# Patient Record
Sex: Male | Born: 1945 | Race: White | Hispanic: No | Marital: Married | Smoking: Former smoker
Health system: Southern US, Community
[De-identification: ages and names within clinical notes are randomized; demographics above are authoritative.]

## PROBLEM LIST (undated history)

## (undated) DIAGNOSIS — C801 Malignant (primary) neoplasm, unspecified: Secondary | ICD-10-CM

## (undated) DIAGNOSIS — C679 Malignant neoplasm of bladder, unspecified: Secondary | ICD-10-CM

## (undated) DIAGNOSIS — I10 Essential (primary) hypertension: Secondary | ICD-10-CM

## (undated) DIAGNOSIS — Z936 Other artificial openings of urinary tract status: Secondary | ICD-10-CM

## (undated) DIAGNOSIS — E785 Hyperlipidemia, unspecified: Secondary | ICD-10-CM

## (undated) DIAGNOSIS — C3490 Malignant neoplasm of unspecified part of unspecified bronchus or lung: Secondary | ICD-10-CM

## (undated) DIAGNOSIS — K219 Gastro-esophageal reflux disease without esophagitis: Secondary | ICD-10-CM

## (undated) DIAGNOSIS — G629 Polyneuropathy, unspecified: Secondary | ICD-10-CM

## (undated) HISTORY — PX: HERNIA REPAIR: SHX51

## (undated) HISTORY — PX: OTHER SURGICAL HISTORY: SHX169

## (undated) HISTORY — PX: CARDIAC SURGERY: SHX584

## (undated) HISTORY — PX: PNEUMONECTOMY: SHX168

## (undated) HISTORY — PX: CHOLECYSTECTOMY: SHX55

## (undated) HISTORY — PX: NEPHRECTOMY: SHX65

---

## 2004-09-22 ENCOUNTER — Ambulatory Visit (HOSPITAL_COMMUNITY): Admission: RE | Admit: 2004-09-22 | Discharge: 2004-09-22 | Payer: Self-pay | Admitting: Thoracic Surgery

## 2004-09-24 ENCOUNTER — Ambulatory Visit (HOSPITAL_COMMUNITY): Admission: RE | Admit: 2004-09-24 | Discharge: 2004-09-24 | Payer: Self-pay | Admitting: Thoracic Surgery

## 2004-09-24 ENCOUNTER — Encounter (INDEPENDENT_AMBULATORY_CARE_PROVIDER_SITE_OTHER): Payer: Self-pay | Admitting: *Deleted

## 2004-09-30 ENCOUNTER — Ambulatory Visit: Payer: Self-pay | Admitting: Internal Medicine

## 2004-11-15 ENCOUNTER — Ambulatory Visit: Payer: Self-pay | Admitting: Internal Medicine

## 2004-12-16 ENCOUNTER — Ambulatory Visit (HOSPITAL_COMMUNITY): Admission: RE | Admit: 2004-12-16 | Discharge: 2004-12-16 | Payer: Self-pay | Admitting: Internal Medicine

## 2005-01-11 ENCOUNTER — Encounter (INDEPENDENT_AMBULATORY_CARE_PROVIDER_SITE_OTHER): Payer: Self-pay | Admitting: Specialist

## 2005-01-11 ENCOUNTER — Ambulatory Visit: Payer: Self-pay | Admitting: Internal Medicine

## 2005-01-11 ENCOUNTER — Inpatient Hospital Stay (HOSPITAL_COMMUNITY): Admission: RE | Admit: 2005-01-11 | Discharge: 2005-01-21 | Payer: Self-pay | Admitting: Thoracic Surgery

## 2005-01-13 ENCOUNTER — Encounter (INDEPENDENT_AMBULATORY_CARE_PROVIDER_SITE_OTHER): Payer: Self-pay | Admitting: *Deleted

## 2005-01-14 ENCOUNTER — Ambulatory Visit: Payer: Self-pay | Admitting: Internal Medicine

## 2005-01-24 ENCOUNTER — Ambulatory Visit: Payer: Self-pay | Admitting: Cardiology

## 2005-01-31 ENCOUNTER — Ambulatory Visit: Payer: Self-pay | Admitting: Cardiology

## 2005-02-01 ENCOUNTER — Inpatient Hospital Stay (HOSPITAL_COMMUNITY): Admission: AD | Admit: 2005-02-01 | Discharge: 2005-02-09 | Payer: Self-pay | Admitting: Thoracic Surgery

## 2005-02-01 ENCOUNTER — Encounter: Admission: RE | Admit: 2005-02-01 | Discharge: 2005-02-01 | Payer: Self-pay | Admitting: Thoracic Surgery

## 2005-02-01 ENCOUNTER — Ambulatory Visit: Payer: Self-pay | Admitting: Cardiology

## 2005-02-02 ENCOUNTER — Encounter (INDEPENDENT_AMBULATORY_CARE_PROVIDER_SITE_OTHER): Payer: Self-pay | Admitting: *Deleted

## 2005-02-02 ENCOUNTER — Encounter: Payer: Self-pay | Admitting: Cardiovascular Disease

## 2005-02-04 ENCOUNTER — Encounter: Payer: Self-pay | Admitting: Cardiology

## 2005-02-15 ENCOUNTER — Encounter: Admission: RE | Admit: 2005-02-15 | Discharge: 2005-02-15 | Payer: Self-pay | Admitting: Thoracic Surgery

## 2005-03-01 ENCOUNTER — Encounter: Admission: RE | Admit: 2005-03-01 | Discharge: 2005-03-01 | Payer: Self-pay | Admitting: Thoracic Surgery

## 2005-03-14 ENCOUNTER — Ambulatory Visit: Payer: Self-pay | Admitting: Internal Medicine

## 2005-04-05 ENCOUNTER — Ambulatory Visit (HOSPITAL_COMMUNITY): Admission: RE | Admit: 2005-04-05 | Discharge: 2005-04-05 | Payer: Self-pay | Admitting: Internal Medicine

## 2005-04-12 ENCOUNTER — Encounter: Admission: RE | Admit: 2005-04-12 | Discharge: 2005-04-12 | Payer: Self-pay | Admitting: Thoracic Surgery

## 2005-04-19 LAB — CBC WITH DIFFERENTIAL/PLATELET
BASO%: 2.1 % — ABNORMAL HIGH (ref 0.0–2.0)
EOS%: 0.3 % (ref 0.0–7.0)
MCH: 31.3 pg (ref 28.0–33.4)
MCHC: 34.5 g/dL (ref 32.0–35.9)
MCV: 90.6 fL (ref 81.6–98.0)
MONO%: 7.2 % (ref 0.0–13.0)
RBC: 3.5 10*6/uL — ABNORMAL LOW (ref 4.20–5.71)
RDW: 15.3 % — ABNORMAL HIGH (ref 11.2–14.6)
lymph#: 1.1 10*3/uL (ref 0.9–3.3)

## 2005-04-19 LAB — COMPREHENSIVE METABOLIC PANEL
ALT: 16 U/L (ref 0–40)
AST: 16 U/L (ref 0–37)
Albumin: 3.7 g/dL (ref 3.5–5.2)
Alkaline Phosphatase: 90 U/L (ref 39–117)
Calcium: 8.5 mg/dL (ref 8.4–10.5)
Chloride: 104 mEq/L (ref 96–112)
Creatinine, Ser: 1 mg/dL (ref 0.4–1.5)
Potassium: 4.5 mEq/L (ref 3.5–5.3)

## 2005-04-26 LAB — COMPREHENSIVE METABOLIC PANEL
ALT: 33 U/L (ref 0–40)
AST: 29 U/L (ref 0–37)
Alkaline Phosphatase: 109 U/L (ref 39–117)
BUN: 18 mg/dL (ref 6–23)
Calcium: 8.9 mg/dL (ref 8.4–10.5)
Chloride: 105 mEq/L (ref 96–112)
Creatinine, Ser: 1.1 mg/dL (ref 0.4–1.5)
Total Bilirubin: 0.4 mg/dL (ref 0.3–1.2)

## 2005-04-26 LAB — CBC WITH DIFFERENTIAL/PLATELET
BASO%: 1.1 % (ref 0.0–2.0)
Basophils Absolute: 0 10*3/uL (ref 0.0–0.1)
EOS%: 0.6 % (ref 0.0–7.0)
HCT: 33 % — ABNORMAL LOW (ref 38.7–49.9)
HGB: 11.2 g/dL — ABNORMAL LOW (ref 13.0–17.1)
MCH: 31.4 pg (ref 28.0–33.4)
MCHC: 34 g/dL (ref 32.0–35.9)
MCV: 92.5 fL (ref 81.6–98.0)
MONO%: 6.8 % (ref 0.0–13.0)
NEUT%: 49.8 % (ref 40.0–75.0)
lymph#: 1.4 10*3/uL (ref 0.9–3.3)

## 2005-04-29 ENCOUNTER — Ambulatory Visit: Payer: Self-pay | Admitting: Internal Medicine

## 2005-05-02 LAB — CBC WITH DIFFERENTIAL/PLATELET
BASO%: 0.5 % (ref 0.0–2.0)
Basophils Absolute: 0 10*3/uL (ref 0.0–0.1)
EOS%: 2.8 % (ref 0.0–7.0)
HCT: 33 % — ABNORMAL LOW (ref 38.7–49.9)
HGB: 11.1 g/dL — ABNORMAL LOW (ref 13.0–17.1)
LYMPH%: 19.7 % (ref 14.0–48.0)
MCH: 31.7 pg (ref 28.0–33.4)
MCHC: 33.7 g/dL (ref 32.0–35.9)
MCV: 94.1 fL (ref 81.6–98.0)
MONO%: 7.4 % (ref 0.0–13.0)
NEUT%: 69.6 % (ref 40.0–75.0)
Platelets: 355 10*3/uL (ref 145–400)
lymph#: 1.3 10*3/uL (ref 0.9–3.3)

## 2005-05-02 LAB — COMPREHENSIVE METABOLIC PANEL
ALT: 23 U/L (ref 0–40)
AST: 22 U/L (ref 0–37)
BUN: 17 mg/dL (ref 6–23)
Calcium: 9 mg/dL (ref 8.4–10.5)
Chloride: 107 mEq/L (ref 96–112)
Creatinine, Ser: 1.1 mg/dL (ref 0.4–1.5)
Total Bilirubin: 0.4 mg/dL (ref 0.3–1.2)

## 2005-05-10 LAB — COMPREHENSIVE METABOLIC PANEL
Albumin: 3.6 g/dL (ref 3.5–5.2)
Alkaline Phosphatase: 81 U/L (ref 39–117)
BUN: 20 mg/dL (ref 6–23)
Creatinine, Ser: 1.1 mg/dL (ref 0.4–1.5)
Glucose, Bld: 80 mg/dL (ref 70–99)
Potassium: 4.5 mEq/L (ref 3.5–5.3)
Total Bilirubin: 0.3 mg/dL (ref 0.3–1.2)

## 2005-05-10 LAB — CBC WITH DIFFERENTIAL/PLATELET
Basophils Absolute: 0.1 10*3/uL (ref 0.0–0.1)
Eosinophils Absolute: 0 10*3/uL (ref 0.0–0.5)
HCT: 31.8 % — ABNORMAL LOW (ref 38.7–49.9)
HGB: 11.1 g/dL — ABNORMAL LOW (ref 13.0–17.1)
MCV: 92.4 fL (ref 81.6–98.0)
MONO%: 8.2 % (ref 0.0–13.0)
NEUT#: 2.9 10*3/uL (ref 1.5–6.5)
RDW: 16.8 % — ABNORMAL HIGH (ref 11.2–14.6)

## 2005-05-25 ENCOUNTER — Ambulatory Visit (HOSPITAL_COMMUNITY): Admission: RE | Admit: 2005-05-25 | Discharge: 2005-05-25 | Payer: Self-pay | Admitting: Internal Medicine

## 2005-05-31 ENCOUNTER — Ambulatory Visit (HOSPITAL_COMMUNITY): Admission: RE | Admit: 2005-05-31 | Discharge: 2005-05-31 | Payer: Self-pay | Admitting: Internal Medicine

## 2005-06-01 LAB — COMPREHENSIVE METABOLIC PANEL
ALT: 15 U/L (ref 0–40)
AST: 19 U/L (ref 0–37)
Alkaline Phosphatase: 88 U/L (ref 39–117)
Creatinine, Ser: 1.2 mg/dL (ref 0.4–1.5)
Total Bilirubin: 0.4 mg/dL (ref 0.3–1.2)

## 2005-06-01 LAB — CBC WITH DIFFERENTIAL/PLATELET
BASO%: 0.6 % (ref 0.0–2.0)
EOS%: 2.9 % (ref 0.0–7.0)
HCT: 35.1 % — ABNORMAL LOW (ref 38.7–49.9)
LYMPH%: 24.1 % (ref 14.0–48.0)
MCH: 32.3 pg (ref 28.0–33.4)
MCHC: 33.6 g/dL (ref 32.0–35.9)
MCV: 96.2 fL (ref 81.6–98.0)
MONO%: 11.7 % (ref 0.0–13.0)
NEUT%: 60.7 % (ref 40.0–75.0)
Platelets: 324 10*3/uL (ref 145–400)
lymph#: 1.4 10*3/uL (ref 0.9–3.3)

## 2005-06-03 ENCOUNTER — Encounter (INDEPENDENT_AMBULATORY_CARE_PROVIDER_SITE_OTHER): Payer: Self-pay | Admitting: Specialist

## 2005-06-03 ENCOUNTER — Ambulatory Visit (HOSPITAL_COMMUNITY): Admission: RE | Admit: 2005-06-03 | Discharge: 2005-06-03 | Payer: Self-pay | Admitting: Internal Medicine

## 2005-06-22 ENCOUNTER — Ambulatory Visit: Payer: Self-pay | Admitting: Internal Medicine

## 2005-07-13 ENCOUNTER — Encounter: Admission: RE | Admit: 2005-07-13 | Discharge: 2005-07-13 | Payer: Self-pay | Admitting: Thoracic Surgery

## 2005-07-19 LAB — CBC WITH DIFFERENTIAL/PLATELET
BASO%: 0.4 % (ref 0.0–2.0)
Basophils Absolute: 0 10*3/uL (ref 0.0–0.1)
Eosinophils Absolute: 0.1 10*3/uL (ref 0.0–0.5)
HCT: 39.3 % (ref 38.7–49.9)
HGB: 13.5 g/dL (ref 13.0–17.1)
LYMPH%: 20.2 % (ref 14.0–48.0)
MONO#: 0.4 10*3/uL (ref 0.1–0.9)
NEUT#: 4.3 10*3/uL (ref 1.5–6.5)
NEUT%: 71.4 % (ref 40.0–75.0)
Platelets: 233 10*3/uL (ref 145–400)
WBC: 6 10*3/uL (ref 4.0–10.0)
lymph#: 1.2 10*3/uL (ref 0.9–3.3)

## 2005-07-19 LAB — COMPREHENSIVE METABOLIC PANEL
ALT: 11 U/L (ref 0–40)
BUN: 23 mg/dL (ref 6–23)
CO2: 25 mEq/L (ref 19–32)
Calcium: 8.9 mg/dL (ref 8.4–10.5)
Chloride: 105 mEq/L (ref 96–112)
Creatinine, Ser: 1.4 mg/dL (ref 0.40–1.50)
Glucose, Bld: 93 mg/dL (ref 70–99)

## 2005-08-11 ENCOUNTER — Ambulatory Visit (HOSPITAL_COMMUNITY): Admission: RE | Admit: 2005-08-11 | Discharge: 2005-08-11 | Payer: Self-pay | Admitting: Internal Medicine

## 2005-08-15 ENCOUNTER — Ambulatory Visit: Payer: Self-pay | Admitting: Internal Medicine

## 2005-08-16 LAB — CBC WITH DIFFERENTIAL/PLATELET
Basophils Absolute: 0.1 10*3/uL (ref 0.0–0.1)
Eosinophils Absolute: 0.1 10*3/uL (ref 0.0–0.5)
HGB: 14.9 g/dL (ref 13.0–17.1)
LYMPH%: 19.8 % (ref 14.0–48.0)
MCV: 96.3 fL (ref 81.6–98.0)
MONO#: 0.3 10*3/uL (ref 0.1–0.9)
MONO%: 5.3 % (ref 0.0–13.0)
NEUT#: 4.1 10*3/uL (ref 1.5–6.5)
Platelets: 209 10*3/uL (ref 145–400)
RBC: 4.6 10*6/uL (ref 4.20–5.71)
WBC: 5.6 10*3/uL (ref 4.0–10.0)

## 2005-08-16 LAB — COMPREHENSIVE METABOLIC PANEL
ALT: 11 U/L (ref 0–40)
AST: 13 U/L (ref 0–37)
BUN: 17 mg/dL (ref 6–23)
CO2: 28 mEq/L (ref 19–32)
Calcium: 9.6 mg/dL (ref 8.4–10.5)
Glucose, Bld: 102 mg/dL — ABNORMAL HIGH (ref 70–99)
Potassium: 4.1 mEq/L (ref 3.5–5.3)
Sodium: 140 mEq/L (ref 135–145)

## 2005-08-29 LAB — CBC WITH DIFFERENTIAL/PLATELET
BASO%: 1 % (ref 0.0–2.0)
LYMPH%: 24.4 % (ref 14.0–48.0)
MCH: 32.3 pg (ref 28.0–33.4)
MCHC: 34.3 g/dL (ref 32.0–35.9)
MCV: 94.2 fL (ref 81.6–98.0)
MONO%: 8.3 % (ref 0.0–13.0)
Platelets: 199 10*3/uL (ref 145–400)
RBC: 4.22 10*6/uL (ref 4.20–5.71)

## 2005-08-29 LAB — COMPREHENSIVE METABOLIC PANEL
ALT: 13 U/L (ref 0–40)
AST: 12 U/L (ref 0–37)
BUN: 18 mg/dL (ref 6–23)
CO2: 26 mEq/L (ref 19–32)
Creatinine, Ser: 1.04 mg/dL (ref 0.40–1.50)
Total Bilirubin: 0.4 mg/dL (ref 0.3–1.2)

## 2005-08-29 LAB — UA PROTEIN, DIPSTICK - CHCC: Protein, Urine: NEGATIVE mg/dL

## 2005-09-07 LAB — CBC WITH DIFFERENTIAL/PLATELET
BASO%: 0.5 % (ref 0.0–2.0)
Basophils Absolute: 0.1 10*3/uL (ref 0.0–0.1)
HCT: 37 % — ABNORMAL LOW (ref 38.7–49.9)
HGB: 12.7 g/dL — ABNORMAL LOW (ref 13.0–17.1)
LYMPH%: 16.5 % (ref 14.0–48.0)
MCHC: 34.3 g/dL (ref 32.0–35.9)
MONO#: 0.6 10*3/uL (ref 0.1–0.9)
NEUT%: 78.3 % — ABNORMAL HIGH (ref 40.0–75.0)
Platelets: 147 10*3/uL (ref 145–400)
WBC: 14 10*3/uL — ABNORMAL HIGH (ref 4.0–10.0)

## 2005-09-07 LAB — COMPREHENSIVE METABOLIC PANEL
ALT: 22 U/L (ref 0–40)
AST: 17 U/L (ref 0–37)
Albumin: 3.9 g/dL (ref 3.5–5.2)
Alkaline Phosphatase: 115 U/L (ref 39–117)
Calcium: 8.9 mg/dL (ref 8.4–10.5)
Chloride: 107 mEq/L (ref 96–112)
Potassium: 4.5 mEq/L (ref 3.5–5.3)

## 2005-09-14 LAB — CBC WITH DIFFERENTIAL/PLATELET
BASO%: 1.1 % (ref 0.0–2.0)
EOS%: 0.7 % (ref 0.0–7.0)
HCT: 40.3 % (ref 38.7–49.9)
MCH: 32.4 pg (ref 28.0–33.4)
MCHC: 34.4 g/dL (ref 32.0–35.9)
MCV: 94.2 fL (ref 81.6–98.0)
MONO%: 7.1 % (ref 0.0–13.0)
NEUT%: 76 % — ABNORMAL HIGH (ref 40.0–75.0)
RDW: 13.4 % (ref 11.2–14.6)
lymph#: 1.4 10*3/uL (ref 0.9–3.3)

## 2005-09-14 LAB — URINALYSIS, MICROSCOPIC - CHCC
Bilirubin (Urine): NEGATIVE
Blood: NEGATIVE
Leukocyte Esterase: NEGATIVE
Nitrite: NEGATIVE
RBC count: NEGATIVE (ref 0–2)
pH: 5 (ref 4.6–8.0)

## 2005-09-20 LAB — COMPREHENSIVE METABOLIC PANEL
BUN: 17 mg/dL (ref 6–23)
CO2: 26 mEq/L (ref 19–32)
Calcium: 8.6 mg/dL (ref 8.4–10.5)
Chloride: 109 mEq/L (ref 96–112)
Creatinine, Ser: 0.94 mg/dL (ref 0.40–1.50)

## 2005-09-20 LAB — CBC WITH DIFFERENTIAL/PLATELET
BASO%: 1.1 % (ref 0.0–2.0)
Basophils Absolute: 0.1 10*3/uL (ref 0.0–0.1)
EOS%: 0.7 % (ref 0.0–7.0)
HCT: 38.2 % — ABNORMAL LOW (ref 38.7–49.9)
HGB: 13.2 g/dL (ref 13.0–17.1)
LYMPH%: 19.4 % (ref 14.0–48.0)
MCH: 32.2 pg (ref 28.0–33.4)
MCHC: 34.6 g/dL (ref 32.0–35.9)
MCV: 93 fL (ref 81.6–98.0)
MONO%: 9 % (ref 0.0–13.0)
NEUT%: 69.7 % (ref 40.0–75.0)
Platelets: 239 10*3/uL (ref 145–400)

## 2005-09-29 LAB — CBC WITH DIFFERENTIAL/PLATELET
BASO%: 1.7 % (ref 0.0–2.0)
Basophils Absolute: 0.2 10*3/uL — ABNORMAL HIGH (ref 0.0–0.1)
Eosinophils Absolute: 0.1 10*3/uL (ref 0.0–0.5)
HCT: 36.2 % — ABNORMAL LOW (ref 38.7–49.9)
HGB: 12.1 g/dL — ABNORMAL LOW (ref 13.0–17.1)
MONO#: 1.2 10*3/uL — ABNORMAL HIGH (ref 0.1–0.9)
NEUT#: 10.1 10*3/uL — ABNORMAL HIGH (ref 1.5–6.5)
NEUT%: 73.9 % (ref 40.0–75.0)
WBC: 13.6 10*3/uL — ABNORMAL HIGH (ref 4.0–10.0)
lymph#: 2.1 10*3/uL (ref 0.9–3.3)

## 2005-09-29 LAB — COMPREHENSIVE METABOLIC PANEL
AST: 17 U/L (ref 0–37)
Albumin: 3.8 g/dL (ref 3.5–5.2)
BUN: 15 mg/dL (ref 6–23)
Calcium: 9 mg/dL (ref 8.4–10.5)
Chloride: 107 mEq/L (ref 96–112)
Glucose, Bld: 97 mg/dL (ref 70–99)
Potassium: 4.5 mEq/L (ref 3.5–5.3)

## 2005-10-03 ENCOUNTER — Ambulatory Visit: Payer: Self-pay | Admitting: Internal Medicine

## 2005-10-05 LAB — COMPREHENSIVE METABOLIC PANEL
ALT: 17 U/L (ref 0–40)
AST: 14 U/L (ref 0–37)
BUN: 15 mg/dL (ref 6–23)
Calcium: 8.9 mg/dL (ref 8.4–10.5)
Creatinine, Ser: 1 mg/dL (ref 0.40–1.50)
Total Bilirubin: 0.4 mg/dL (ref 0.3–1.2)

## 2005-10-05 LAB — CBC WITH DIFFERENTIAL/PLATELET
BASO%: 0.5 % (ref 0.0–2.0)
Basophils Absolute: 0.1 10*3/uL (ref 0.0–0.1)
EOS%: 0.3 % (ref 0.0–7.0)
HGB: 13.9 g/dL (ref 13.0–17.1)
MCH: 33 pg (ref 28.0–33.4)
RDW: 14.9 % — ABNORMAL HIGH (ref 11.2–14.6)
lymph#: 1.6 10*3/uL (ref 0.9–3.3)

## 2005-10-05 LAB — UA PROTEIN, DIPSTICK - CHCC: Protein, Urine: NEGATIVE mg/dL

## 2005-10-10 LAB — COMPREHENSIVE METABOLIC PANEL
ALT: 10 U/L (ref 0–40)
CO2: 24 mEq/L (ref 19–32)
Calcium: 8.5 mg/dL (ref 8.4–10.5)
Chloride: 107 mEq/L (ref 96–112)
Creatinine, Ser: 0.94 mg/dL (ref 0.40–1.50)
Glucose, Bld: 109 mg/dL — ABNORMAL HIGH (ref 70–99)
Total Bilirubin: 0.4 mg/dL (ref 0.3–1.2)
Total Protein: 6.3 g/dL (ref 6.0–8.3)

## 2005-10-10 LAB — CBC WITH DIFFERENTIAL/PLATELET
BASO%: 0.8 % (ref 0.0–2.0)
Eosinophils Absolute: 0 10*3/uL (ref 0.0–0.5)
HCT: 36.1 % — ABNORMAL LOW (ref 38.7–49.9)
MCHC: 34.6 g/dL (ref 32.0–35.9)
MONO#: 0.5 10*3/uL (ref 0.1–0.9)
NEUT#: 5.2 10*3/uL (ref 1.5–6.5)
Platelets: 199 10*3/uL (ref 145–400)
RBC: 3.78 10*6/uL — ABNORMAL LOW (ref 4.20–5.71)
WBC: 6.7 10*3/uL (ref 4.0–10.0)
lymph#: 0.9 10*3/uL (ref 0.9–3.3)

## 2005-10-11 ENCOUNTER — Encounter: Admission: RE | Admit: 2005-10-11 | Discharge: 2005-10-11 | Payer: Self-pay | Admitting: Thoracic Surgery

## 2005-10-18 LAB — COMPREHENSIVE METABOLIC PANEL
Albumin: 2.9 g/dL — ABNORMAL LOW (ref 3.5–5.2)
BUN: 11 mg/dL (ref 6–23)
CO2: 23 mEq/L (ref 19–32)
Calcium: 7 mg/dL — ABNORMAL LOW (ref 8.4–10.5)
Chloride: 111 mEq/L (ref 96–112)
Creatinine, Ser: 0.74 mg/dL (ref 0.40–1.50)
Potassium: 3.1 mEq/L — ABNORMAL LOW (ref 3.5–5.3)

## 2005-10-18 LAB — CBC WITH DIFFERENTIAL/PLATELET
Basophils Absolute: 0.1 10*3/uL (ref 0.0–0.1)
Eosinophils Absolute: 0.1 10*3/uL (ref 0.0–0.5)
HCT: 34.3 % — ABNORMAL LOW (ref 38.7–49.9)
HGB: 12.3 g/dL — ABNORMAL LOW (ref 13.0–17.1)
LYMPH%: 18.8 % (ref 14.0–48.0)
MCHC: 35.8 g/dL (ref 32.0–35.9)
MONO#: 1.1 10*3/uL — ABNORMAL HIGH (ref 0.1–0.9)
NEUT#: 5.4 10*3/uL (ref 1.5–6.5)
NEUT%: 65 % (ref 40.0–75.0)
Platelets: 136 10*3/uL — ABNORMAL LOW (ref 145–400)
WBC: 8.4 10*3/uL (ref 4.0–10.0)
lymph#: 1.6 10*3/uL (ref 0.9–3.3)

## 2005-10-28 ENCOUNTER — Ambulatory Visit (HOSPITAL_COMMUNITY): Admission: RE | Admit: 2005-10-28 | Discharge: 2005-10-28 | Payer: Self-pay | Admitting: Internal Medicine

## 2005-11-01 LAB — CBC WITH DIFFERENTIAL/PLATELET
BASO%: 1.1 % (ref 0.0–2.0)
Basophils Absolute: 0.1 10*3/uL (ref 0.0–0.1)
EOS%: 0.6 % (ref 0.0–7.0)
Eosinophils Absolute: 0 10*3/uL (ref 0.0–0.5)
HCT: 38.7 % (ref 38.7–49.9)
HGB: 13.4 g/dL (ref 13.0–17.1)
LYMPH%: 16.9 % (ref 14.0–48.0)
MCH: 33.4 pg (ref 28.0–33.4)
MCHC: 34.6 g/dL (ref 32.0–35.9)
MCV: 96.3 fL (ref 81.6–98.0)
MONO#: 0.7 10*3/uL (ref 0.1–0.9)
MONO%: 8.5 % (ref 0.0–13.0)
NEUT#: 5.7 10*3/uL (ref 1.5–6.5)
NEUT%: 72.9 % (ref 40.0–75.0)
Platelets: 231 10*3/uL (ref 145–400)
RBC: 4.02 10*6/uL — ABNORMAL LOW (ref 4.20–5.71)
RDW: 16.5 % — ABNORMAL HIGH (ref 11.2–14.6)
WBC: 7.9 10*3/uL (ref 4.0–10.0)
lymph#: 1.3 10*3/uL (ref 0.9–3.3)

## 2005-11-01 LAB — COMPREHENSIVE METABOLIC PANEL
ALT: 9 U/L (ref 0–40)
Albumin: 3.9 g/dL (ref 3.5–5.2)
CO2: 24 mEq/L (ref 19–32)
Calcium: 8.7 mg/dL (ref 8.4–10.5)
Chloride: 106 mEq/L (ref 96–112)
Creatinine, Ser: 0.91 mg/dL (ref 0.40–1.50)
Potassium: 4.5 mEq/L (ref 3.5–5.3)
Sodium: 140 mEq/L (ref 135–145)
Total Protein: 7 g/dL (ref 6.0–8.3)

## 2005-11-01 LAB — UA PROTEIN, DIPSTICK - CHCC: Protein, Urine: NEGATIVE mg/dL

## 2005-11-08 LAB — COMPREHENSIVE METABOLIC PANEL
AST: 14 U/L (ref 0–37)
Albumin: 3.7 g/dL (ref 3.5–5.2)
Alkaline Phosphatase: 121 U/L — ABNORMAL HIGH (ref 39–117)
BUN: 17 mg/dL (ref 6–23)
Calcium: 8.3 mg/dL — ABNORMAL LOW (ref 8.4–10.5)
Chloride: 104 mEq/L (ref 96–112)
Potassium: 4 mEq/L (ref 3.5–5.3)
Sodium: 138 mEq/L (ref 135–145)
Total Protein: 6.1 g/dL (ref 6.0–8.3)

## 2005-11-08 LAB — CBC WITH DIFFERENTIAL/PLATELET
Basophils Absolute: 0 10*3/uL (ref 0.0–0.1)
EOS%: 1.3 % (ref 0.0–7.0)
Eosinophils Absolute: 0.1 10*3/uL (ref 0.0–0.5)
HGB: 11.2 g/dL — ABNORMAL LOW (ref 13.0–17.1)
MCH: 34 pg — ABNORMAL HIGH (ref 28.0–33.4)
NEUT#: 3 10*3/uL (ref 1.5–6.5)
RBC: 3.3 10*6/uL — ABNORMAL LOW (ref 4.20–5.71)
RDW: 18.1 % — ABNORMAL HIGH (ref 11.2–14.6)
lymph#: 1.3 10*3/uL (ref 0.9–3.3)

## 2005-11-15 LAB — COMPREHENSIVE METABOLIC PANEL
ALT: 11 U/L (ref 0–40)
AST: 15 U/L (ref 0–37)
Alkaline Phosphatase: 134 U/L — ABNORMAL HIGH (ref 39–117)
CO2: 25 mEq/L (ref 19–32)
Sodium: 143 mEq/L (ref 135–145)
Total Bilirubin: 0.4 mg/dL (ref 0.3–1.2)
Total Protein: 6.5 g/dL (ref 6.0–8.3)

## 2005-11-15 LAB — CBC WITH DIFFERENTIAL/PLATELET
BASO%: 1.2 % (ref 0.0–2.0)
Basophils Absolute: 0.1 10*3/uL (ref 0.0–0.1)
EOS%: 0.6 % (ref 0.0–7.0)
HGB: 12.5 g/dL — ABNORMAL LOW (ref 13.0–17.1)
MCH: 34.4 pg — ABNORMAL HIGH (ref 28.0–33.4)
MCHC: 34.2 g/dL (ref 32.0–35.9)
MCV: 100.6 fL — ABNORMAL HIGH (ref 81.6–98.0)
MONO%: 5.3 % (ref 0.0–13.0)
RBC: 3.63 10*6/uL — ABNORMAL LOW (ref 4.20–5.71)
RDW: 19 % — ABNORMAL HIGH (ref 11.2–14.6)
lymph#: 1.6 10*3/uL (ref 0.9–3.3)

## 2005-12-02 ENCOUNTER — Ambulatory Visit: Payer: Self-pay | Admitting: Internal Medicine

## 2005-12-06 LAB — COMPREHENSIVE METABOLIC PANEL
ALT: 8 U/L (ref 0–53)
AST: 12 U/L (ref 0–37)
Albumin: 3.8 g/dL (ref 3.5–5.2)
CO2: 26 mEq/L (ref 19–32)
Calcium: 8.7 mg/dL (ref 8.4–10.5)
Chloride: 107 mEq/L (ref 96–112)
Creatinine, Ser: 0.98 mg/dL (ref 0.40–1.50)
Potassium: 4.3 mEq/L (ref 3.5–5.3)
Sodium: 140 mEq/L (ref 135–145)
Total Protein: 6.7 g/dL (ref 6.0–8.3)

## 2005-12-06 LAB — CBC WITH DIFFERENTIAL/PLATELET
BASO%: 2.4 % — ABNORMAL HIGH (ref 0.0–2.0)
EOS%: 2.5 % (ref 0.0–7.0)
HCT: 36.7 % — ABNORMAL LOW (ref 38.7–49.9)
MCH: 34.8 pg — ABNORMAL HIGH (ref 28.0–33.4)
MCHC: 34.2 g/dL (ref 32.0–35.9)
MONO#: 0.4 10*3/uL (ref 0.1–0.9)
NEUT%: 67.3 % (ref 40.0–75.0)
RDW: 16.5 % — ABNORMAL HIGH (ref 11.2–14.6)
WBC: 6.5 10*3/uL (ref 4.0–10.0)
lymph#: 1.4 10*3/uL (ref 0.9–3.3)

## 2005-12-13 LAB — CBC WITH DIFFERENTIAL/PLATELET
BASO%: 0.8 % (ref 0.0–2.0)
Basophils Absolute: 0.1 10*3/uL (ref 0.0–0.1)
HCT: 39.7 % (ref 38.7–49.9)
LYMPH%: 20.1 % (ref 14.0–48.0)
MCH: 35.2 pg — ABNORMAL HIGH (ref 28.0–33.4)
MCHC: 35 g/dL (ref 32.0–35.9)
MONO#: 0.5 10*3/uL (ref 0.1–0.9)
NEUT%: 69.2 % (ref 40.0–75.0)
Platelets: 198 10*3/uL (ref 145–400)
WBC: 7.6 10*3/uL (ref 4.0–10.0)

## 2005-12-13 LAB — COMPREHENSIVE METABOLIC PANEL
ALT: <8 U/L (ref 0–53)
BUN: 20 mg/dL (ref 6–23)
CO2: 26 mEq/L (ref 19–32)
Creatinine, Ser: 0.97 mg/dL (ref 0.40–1.50)
Total Bilirubin: 0.2 mg/dL — ABNORMAL LOW (ref 0.3–1.2)

## 2005-12-22 LAB — COMPREHENSIVE METABOLIC PANEL
Alkaline Phosphatase: 114 U/L (ref 39–117)
BUN: 16 mg/dL (ref 6–23)
CO2: 26 mEq/L (ref 19–32)
Creatinine, Ser: 0.88 mg/dL (ref 0.40–1.50)
Glucose, Bld: 112 mg/dL — ABNORMAL HIGH (ref 70–99)
Total Bilirubin: 0.3 mg/dL (ref 0.3–1.2)
Total Protein: 6.3 g/dL (ref 6.0–8.3)

## 2005-12-22 LAB — CBC WITH DIFFERENTIAL/PLATELET
BASO%: 1.2 % (ref 0.0–2.0)
HCT: 34.2 % — ABNORMAL LOW (ref 38.7–49.9)
LYMPH%: 20.5 % (ref 14.0–48.0)
MCH: 34.5 pg — ABNORMAL HIGH (ref 28.0–33.4)
MCHC: 34.1 g/dL (ref 32.0–35.9)
MONO#: 0.7 10*3/uL (ref 0.1–0.9)
NEUT%: 68.6 % (ref 40.0–75.0)
Platelets: 94 10*3/uL — ABNORMAL LOW (ref 145–400)
WBC: 8.7 10*3/uL (ref 4.0–10.0)

## 2005-12-27 LAB — COMPREHENSIVE METABOLIC PANEL
ALT: 9 U/L (ref 0–53)
CO2: 23 mEq/L (ref 19–32)
Calcium: 8.5 mg/dL (ref 8.4–10.5)
Chloride: 106 mEq/L (ref 96–112)
Glucose, Bld: 93 mg/dL (ref 70–99)
Sodium: 139 mEq/L (ref 135–145)
Total Bilirubin: 0.4 mg/dL (ref 0.3–1.2)
Total Protein: 6.7 g/dL (ref 6.0–8.3)

## 2005-12-27 LAB — CBC WITH DIFFERENTIAL/PLATELET
Basophils Absolute: 0.1 10*3/uL (ref 0.0–0.1)
Eosinophils Absolute: 0.1 10*3/uL (ref 0.0–0.5)
HGB: 13.7 g/dL (ref 13.0–17.1)
LYMPH%: 19.4 % (ref 14.0–48.0)
MCV: 99.2 fL — ABNORMAL HIGH (ref 81.6–98.0)
MONO#: 0.9 10*3/uL (ref 0.1–0.9)
MONO%: 8.1 % (ref 0.0–13.0)
NEUT#: 8 10*3/uL — ABNORMAL HIGH (ref 1.5–6.5)
Platelets: 135 10*3/uL — ABNORMAL LOW (ref 145–400)
RBC: 3.95 10*6/uL — ABNORMAL LOW (ref 4.20–5.71)
RDW: 12.9 % (ref 11.2–14.6)
WBC: 11.2 10*3/uL — ABNORMAL HIGH (ref 4.0–10.0)

## 2005-12-27 LAB — UA PROTEIN, DIPSTICK - CHCC: Protein, Urine: NEGATIVE mg/dL

## 2006-01-04 LAB — CBC WITH DIFFERENTIAL/PLATELET
BASO%: 1.2 % (ref 0.0–2.0)
Eosinophils Absolute: 0.1 10*3/uL (ref 0.0–0.5)
MCHC: 35.4 g/dL (ref 32.0–35.9)
MONO#: 0.6 10*3/uL (ref 0.1–0.9)
NEUT#: 4.9 10*3/uL (ref 1.5–6.5)
RBC: 4.04 10*6/uL — ABNORMAL LOW (ref 4.20–5.71)
RDW: 12.8 % (ref 11.2–14.6)
WBC: 7.1 10*3/uL (ref 4.0–10.0)
lymph#: 1.5 10*3/uL (ref 0.9–3.3)

## 2006-01-04 LAB — COMPREHENSIVE METABOLIC PANEL
ALT: 8 U/L (ref 0–53)
Albumin: 3.7 g/dL (ref 3.5–5.2)
CO2: 26 mEq/L (ref 19–32)
Glucose, Bld: 83 mg/dL (ref 70–99)
Potassium: 4.4 mEq/L (ref 3.5–5.3)
Sodium: 139 mEq/L (ref 135–145)
Total Protein: 6.8 g/dL (ref 6.0–8.3)

## 2006-01-09 ENCOUNTER — Ambulatory Visit: Payer: Self-pay | Admitting: Internal Medicine

## 2006-01-11 ENCOUNTER — Encounter: Admission: RE | Admit: 2006-01-11 | Discharge: 2006-01-11 | Payer: Self-pay | Admitting: Thoracic Surgery

## 2006-01-11 LAB — CBC WITH DIFFERENTIAL/PLATELET
BASO%: 1.8 % (ref 0.0–2.0)
Eosinophils Absolute: 0 10*3/uL (ref 0.0–0.5)
MCHC: 34.3 g/dL (ref 32.0–35.9)
MCV: 97.5 fL (ref 81.6–98.0)
MONO#: 1.5 10*3/uL — ABNORMAL HIGH (ref 0.1–0.9)
MONO%: 30.3 % — ABNORMAL HIGH (ref 0.0–13.0)
NEUT#: 2.7 10*3/uL (ref 1.5–6.5)
RBC: 3.92 10*6/uL — ABNORMAL LOW (ref 4.20–5.71)
RDW: 11.9 % (ref 11.2–14.6)
WBC: 5.1 10*3/uL (ref 4.0–10.0)

## 2006-01-11 LAB — UA PROTEIN, DIPSTICK - CHCC: Protein, Urine: 30 mg/dL

## 2006-01-20 ENCOUNTER — Ambulatory Visit (HOSPITAL_COMMUNITY): Admission: RE | Admit: 2006-01-20 | Discharge: 2006-01-20 | Payer: Self-pay | Admitting: Internal Medicine

## 2006-01-25 LAB — CBC WITH DIFFERENTIAL/PLATELET
Basophils Absolute: 0.1 10*3/uL (ref 0.0–0.1)
Eosinophils Absolute: 0 10*3/uL (ref 0.0–0.5)
HGB: 13.2 g/dL (ref 13.0–17.1)
LYMPH%: 10.2 % — ABNORMAL LOW (ref 14.0–48.0)
MCV: 98.2 fL — ABNORMAL HIGH (ref 81.6–98.0)
MONO%: 9.1 % (ref 0.0–13.0)
NEUT#: 9.5 10*3/uL — ABNORMAL HIGH (ref 1.5–6.5)
Platelets: 227 10*3/uL (ref 145–400)

## 2006-02-16 ENCOUNTER — Ambulatory Visit: Payer: Self-pay | Admitting: Internal Medicine

## 2006-02-21 LAB — CBC WITH DIFFERENTIAL/PLATELET
BASO%: 1.6 % (ref 0.0–2.0)
LYMPH%: 31.1 % (ref 14.0–48.0)
MCHC: 35.1 g/dL (ref 32.0–35.9)
MCV: 98.4 fL — ABNORMAL HIGH (ref 81.6–98.0)
MONO%: 7.8 % (ref 0.0–13.0)
Platelets: 179 10*3/uL (ref 145–400)
RBC: 4.04 10*6/uL — ABNORMAL LOW (ref 4.20–5.71)

## 2006-02-21 LAB — UA PROTEIN, DIPSTICK - CHCC: Protein, Urine: NEGATIVE mg/dL

## 2006-02-21 LAB — COMPREHENSIVE METABOLIC PANEL
ALT: 8 U/L (ref 0–53)
AST: 14 U/L (ref 0–37)
Albumin: 3.9 g/dL (ref 3.5–5.2)
BUN: 16 mg/dL (ref 6–23)
CO2: 26 mEq/L (ref 19–32)
Calcium: 9.2 mg/dL (ref 8.4–10.5)
Chloride: 109 mEq/L (ref 96–112)
Creatinine, Ser: 0.93 mg/dL (ref 0.40–1.50)
Potassium: 4.3 mEq/L (ref 3.5–5.3)

## 2006-03-14 LAB — CBC WITH DIFFERENTIAL/PLATELET
BASO%: 1.1 % (ref 0.0–2.0)
EOS%: 2.8 % (ref 0.0–7.0)
HGB: 15.6 g/dL (ref 13.0–17.1)
MCH: 34 pg — ABNORMAL HIGH (ref 28.0–33.4)
MCHC: 35 g/dL (ref 32.0–35.9)
MONO#: 0.4 10*3/uL (ref 0.1–0.9)
RDW: 11.8 % (ref 11.2–14.6)
WBC: 5.8 10*3/uL (ref 4.0–10.0)
lymph#: 1.9 10*3/uL (ref 0.9–3.3)

## 2006-03-14 LAB — COMPREHENSIVE METABOLIC PANEL
ALT: 10 U/L (ref 0–53)
AST: 16 U/L (ref 0–37)
Albumin: 4 g/dL (ref 3.5–5.2)
Alkaline Phosphatase: 97 U/L (ref 39–117)
Chloride: 108 mEq/L (ref 96–112)
Potassium: 4.3 mEq/L (ref 3.5–5.3)
Sodium: 141 mEq/L (ref 135–145)
Total Protein: 6.9 g/dL (ref 6.0–8.3)

## 2006-03-29 ENCOUNTER — Ambulatory Visit: Payer: Self-pay | Admitting: Thoracic Surgery

## 2006-03-29 ENCOUNTER — Encounter: Admission: RE | Admit: 2006-03-29 | Discharge: 2006-03-29 | Payer: Self-pay | Admitting: Thoracic Surgery

## 2006-03-31 ENCOUNTER — Ambulatory Visit (HOSPITAL_COMMUNITY): Admission: RE | Admit: 2006-03-31 | Discharge: 2006-03-31 | Payer: Self-pay | Admitting: Internal Medicine

## 2006-04-04 ENCOUNTER — Ambulatory Visit: Payer: Self-pay | Admitting: Internal Medicine

## 2006-04-04 LAB — COMPREHENSIVE METABOLIC PANEL
AST: 15 U/L (ref 0–37)
Albumin: 4.1 g/dL (ref 3.5–5.2)
Alkaline Phosphatase: 87 U/L (ref 39–117)
Potassium: 4.4 mEq/L (ref 3.5–5.3)
Sodium: 139 mEq/L (ref 135–145)
Total Protein: 7.3 g/dL (ref 6.0–8.3)

## 2006-04-04 LAB — CBC WITH DIFFERENTIAL/PLATELET
BASO%: 1.5 % (ref 0.0–2.0)
Basophils Absolute: 0.1 10*3/uL (ref 0.0–0.1)
EOS%: 2.4 % (ref 0.0–7.0)
MCH: 34.1 pg — ABNORMAL HIGH (ref 28.0–33.4)
MCHC: 35.3 g/dL (ref 32.0–35.9)
MCV: 96.6 fL (ref 81.6–98.0)
MONO%: 11.3 % (ref 0.0–13.0)
RBC: 4.68 10*6/uL (ref 4.20–5.71)
RDW: 11.7 % (ref 11.2–14.6)

## 2006-04-04 LAB — UA PROTEIN, DIPSTICK - CHCC: Protein, Urine: NEGATIVE mg/dL

## 2006-04-25 LAB — CBC WITH DIFFERENTIAL/PLATELET
Basophils Absolute: 0.1 10*3/uL (ref 0.0–0.1)
EOS%: 3.3 % (ref 0.0–7.0)
HGB: 15.9 g/dL (ref 13.0–17.1)
LYMPH%: 22.8 % (ref 14.0–48.0)
MCH: 34.1 pg — ABNORMAL HIGH (ref 28.0–33.4)
MCV: 94.8 fL (ref 81.6–98.0)
MONO%: 7.6 % (ref 0.0–13.0)
Platelets: 204 10*3/uL (ref 145–400)
RBC: 4.68 10*6/uL (ref 4.20–5.71)
RDW: 11.7 % (ref 11.2–14.6)

## 2006-04-25 LAB — COMPREHENSIVE METABOLIC PANEL
Alkaline Phosphatase: 99 U/L (ref 39–117)
Creatinine, Ser: 1.02 mg/dL (ref 0.40–1.50)
Glucose, Bld: 78 mg/dL (ref 70–99)
Sodium: 140 mEq/L (ref 135–145)
Total Bilirubin: 0.5 mg/dL (ref 0.3–1.2)
Total Protein: 7.1 g/dL (ref 6.0–8.3)

## 2006-05-16 LAB — CBC WITH DIFFERENTIAL/PLATELET
Basophils Absolute: 0.1 10*3/uL (ref 0.0–0.1)
Eosinophils Absolute: 0.2 10*3/uL (ref 0.0–0.5)
HCT: 48.2 % (ref 38.7–49.9)
HGB: 17 g/dL (ref 13.0–17.1)
LYMPH%: 25.7 % (ref 14.0–48.0)
MCV: 94.7 fL (ref 81.6–98.0)
MONO#: 0.7 10*3/uL (ref 0.1–0.9)
MONO%: 8.2 % (ref 0.0–13.0)
NEUT#: 5.1 10*3/uL (ref 1.5–6.5)
NEUT%: 62.2 % (ref 40.0–75.0)
Platelets: 199 10*3/uL (ref 145–400)
RBC: 5.09 10*6/uL (ref 4.20–5.71)
WBC: 8.1 10*3/uL (ref 4.0–10.0)

## 2006-05-16 LAB — COMPREHENSIVE METABOLIC PANEL
ALT: 12 U/L (ref 0–53)
CO2: 22 mEq/L (ref 19–32)
Calcium: 8.9 mg/dL (ref 8.4–10.5)
Chloride: 106 mEq/L (ref 96–112)
Creatinine, Ser: 1 mg/dL (ref 0.40–1.50)

## 2006-05-16 LAB — UA PROTEIN, DIPSTICK - CHCC: Protein, Urine: NEGATIVE mg/dL

## 2006-06-02 ENCOUNTER — Ambulatory Visit (HOSPITAL_COMMUNITY): Admission: RE | Admit: 2006-06-02 | Discharge: 2006-06-02 | Payer: Self-pay | Admitting: Internal Medicine

## 2006-06-04 ENCOUNTER — Ambulatory Visit (HOSPITAL_COMMUNITY): Admission: RE | Admit: 2006-06-04 | Discharge: 2006-06-04 | Payer: Self-pay | Admitting: Internal Medicine

## 2006-06-05 ENCOUNTER — Ambulatory Visit: Payer: Self-pay | Admitting: Internal Medicine

## 2006-06-07 LAB — COMPREHENSIVE METABOLIC PANEL
ALT: 12 U/L (ref 0–53)
AST: 17 U/L (ref 0–37)
Calcium: 8.8 mg/dL (ref 8.4–10.5)
Chloride: 106 mEq/L (ref 96–112)
Creatinine, Ser: 1.07 mg/dL (ref 0.40–1.50)
Total Bilirubin: 0.5 mg/dL (ref 0.3–1.2)

## 2006-06-07 LAB — CBC WITH DIFFERENTIAL/PLATELET
BASO%: 1.3 % (ref 0.0–2.0)
EOS%: 3.4 % (ref 0.0–7.0)
HCT: 43.8 % (ref 38.7–49.9)
LYMPH%: 29.4 % (ref 14.0–48.0)
MCH: 34.2 pg — ABNORMAL HIGH (ref 28.0–33.4)
MCHC: 36 g/dL — ABNORMAL HIGH (ref 32.0–35.9)
MCV: 94.9 fL (ref 81.6–98.0)
NEUT%: 57.6 % (ref 40.0–75.0)
Platelets: 183 10*3/uL (ref 145–400)

## 2006-06-28 LAB — CBC WITH DIFFERENTIAL/PLATELET
BASO%: 1.4 % (ref 0.0–2.0)
EOS%: 3.1 % (ref 0.0–7.0)
MCH: 33.4 pg (ref 28.0–33.4)
MCHC: 35.1 g/dL (ref 32.0–35.9)
MONO%: 9.5 % (ref 0.0–13.0)
NEUT%: 57.9 % (ref 40.0–75.0)
RDW: 12.4 % (ref 11.2–14.6)
lymph#: 1.9 10*3/uL (ref 0.9–3.3)

## 2006-06-28 LAB — COMPREHENSIVE METABOLIC PANEL
ALT: 14 U/L (ref 0–53)
Alkaline Phosphatase: 82 U/L (ref 39–117)
CO2: 24 mEq/L (ref 19–32)
Creatinine, Ser: 1.17 mg/dL (ref 0.40–1.50)
Sodium: 138 mEq/L (ref 135–145)
Total Bilirubin: 0.6 mg/dL (ref 0.3–1.2)
Total Protein: 6.9 g/dL (ref 6.0–8.3)

## 2006-07-17 ENCOUNTER — Ambulatory Visit: Payer: Self-pay | Admitting: Internal Medicine

## 2006-07-17 LAB — CBC WITH DIFFERENTIAL/PLATELET
Eosinophils Absolute: 0.2 10*3/uL (ref 0.0–0.5)
HCT: 42.4 % (ref 38.7–49.9)
LYMPH%: 24.5 % (ref 14.0–48.0)
MCV: 97.3 fL (ref 81.6–98.0)
MONO#: 0.5 10*3/uL (ref 0.1–0.9)
MONO%: 7.4 % (ref 0.0–13.0)
NEUT#: 4.4 10*3/uL (ref 1.5–6.5)
NEUT%: 63.9 % (ref 40.0–75.0)
Platelets: 189 10*3/uL (ref 145–400)
RBC: 4.36 10*6/uL (ref 4.20–5.71)
WBC: 6.8 10*3/uL (ref 4.0–10.0)

## 2006-07-17 LAB — COMPREHENSIVE METABOLIC PANEL
BUN: 26 mg/dL — ABNORMAL HIGH (ref 6–23)
CO2: 22 mEq/L (ref 19–32)
Calcium: 9.1 mg/dL (ref 8.4–10.5)
Chloride: 109 mEq/L (ref 96–112)
Creatinine, Ser: 1.15 mg/dL (ref 0.40–1.50)
Glucose, Bld: 127 mg/dL — ABNORMAL HIGH (ref 70–99)
Total Bilirubin: 0.4 mg/dL (ref 0.3–1.2)

## 2006-08-03 ENCOUNTER — Ambulatory Visit: Payer: Self-pay | Admitting: Thoracic Surgery

## 2006-08-03 ENCOUNTER — Ambulatory Visit (HOSPITAL_COMMUNITY): Admission: RE | Admit: 2006-08-03 | Discharge: 2006-08-03 | Payer: Self-pay | Admitting: Internal Medicine

## 2006-08-09 LAB — CBC WITH DIFFERENTIAL/PLATELET
BASO%: 1.1 % (ref 0.0–2.0)
Basophils Absolute: 0.1 10*3/uL (ref 0.0–0.1)
Eosinophils Absolute: 0.2 10*3/uL (ref 0.0–0.5)
HCT: 43.4 % (ref 38.7–49.9)
HGB: 15.2 g/dL (ref 13.0–17.1)
MCHC: 35.1 g/dL (ref 32.0–35.9)
MONO#: 0.4 10*3/uL (ref 0.1–0.9)
NEUT#: 3.1 10*3/uL (ref 1.5–6.5)
NEUT%: 54.9 % (ref 40.0–75.0)
Platelets: 183 10*3/uL (ref 145–400)
WBC: 5.6 10*3/uL (ref 4.0–10.0)
lymph#: 1.8 10*3/uL (ref 0.9–3.3)

## 2006-08-09 LAB — COMPREHENSIVE METABOLIC PANEL
ALT: 15 U/L (ref 0–53)
AST: 20 U/L (ref 0–37)
BUN: 25 mg/dL — ABNORMAL HIGH (ref 6–23)
Creatinine, Ser: 1.17 mg/dL (ref 0.40–1.50)
Total Bilirubin: 0.4 mg/dL (ref 0.3–1.2)

## 2006-08-09 LAB — UA PROTEIN, DIPSTICK - CHCC: Protein, Urine: NEGATIVE mg/dL

## 2006-08-30 LAB — COMPREHENSIVE METABOLIC PANEL
AST: 16 U/L (ref 0–37)
Albumin: 3.9 g/dL (ref 3.5–5.2)
BUN: 22 mg/dL (ref 6–23)
CO2: 25 mEq/L (ref 19–32)
Calcium: 9.1 mg/dL (ref 8.4–10.5)
Chloride: 108 mEq/L (ref 96–112)
Creatinine, Ser: 1.11 mg/dL (ref 0.40–1.50)
Glucose, Bld: 83 mg/dL (ref 70–99)
Potassium: 4.7 mEq/L (ref 3.5–5.3)

## 2006-08-30 LAB — CBC WITH DIFFERENTIAL/PLATELET
BASO%: 1 % (ref 0.0–2.0)
Basophils Absolute: 0.1 10*3/uL (ref 0.0–0.1)
HCT: 41.1 % (ref 38.7–49.9)
HGB: 14.7 g/dL (ref 13.0–17.1)
LYMPH%: 29.9 % (ref 14.0–48.0)
MCHC: 35.8 g/dL (ref 32.0–35.9)
MONO#: 0.6 10*3/uL (ref 0.1–0.9)
NEUT%: 56.1 % (ref 40.0–75.0)
Platelets: 189 10*3/uL (ref 145–400)
WBC: 5.7 10*3/uL (ref 4.0–10.0)

## 2006-09-20 ENCOUNTER — Ambulatory Visit: Payer: Self-pay | Admitting: Internal Medicine

## 2006-09-20 LAB — CBC WITH DIFFERENTIAL/PLATELET
BASO%: 1.3 % (ref 0.0–2.0)
EOS%: 3.6 % (ref 0.0–7.0)
MCH: 34.3 pg — ABNORMAL HIGH (ref 28.0–33.4)
MCHC: 36.1 g/dL — ABNORMAL HIGH (ref 32.0–35.9)
MONO#: 0.5 10*3/uL (ref 0.1–0.9)
RBC: 4.54 10*6/uL (ref 4.20–5.71)
RDW: 11.8 % (ref 11.2–14.6)
WBC: 6.2 10*3/uL (ref 4.0–10.0)
lymph#: 2 10*3/uL (ref 0.9–3.3)

## 2006-09-20 LAB — UA PROTEIN, DIPSTICK - CHCC: Protein, Urine: NEGATIVE mg/dL

## 2006-09-20 LAB — COMPREHENSIVE METABOLIC PANEL
ALT: 13 U/L (ref 0–53)
AST: 18 U/L (ref 0–37)
Albumin: 4 g/dL (ref 3.5–5.2)
CO2: 22 mEq/L (ref 19–32)
Calcium: 9 mg/dL (ref 8.4–10.5)
Chloride: 105 mEq/L (ref 96–112)
Potassium: 4.3 mEq/L (ref 3.5–5.3)
Sodium: 137 mEq/L (ref 135–145)
Total Protein: 7 g/dL (ref 6.0–8.3)

## 2006-10-06 ENCOUNTER — Ambulatory Visit (HOSPITAL_COMMUNITY): Admission: RE | Admit: 2006-10-06 | Discharge: 2006-10-06 | Payer: Self-pay | Admitting: Internal Medicine

## 2006-10-11 LAB — COMPREHENSIVE METABOLIC PANEL
Albumin: 4 g/dL (ref 3.5–5.2)
Alkaline Phosphatase: 94 U/L (ref 39–117)
Calcium: 9.3 mg/dL (ref 8.4–10.5)
Chloride: 106 mEq/L (ref 96–112)
Glucose, Bld: 88 mg/dL (ref 70–99)
Potassium: 4.8 mEq/L (ref 3.5–5.3)
Sodium: 138 mEq/L (ref 135–145)
Total Protein: 7.4 g/dL (ref 6.0–8.3)

## 2006-10-11 LAB — CBC WITH DIFFERENTIAL/PLATELET
Basophils Absolute: 0.1 10*3/uL (ref 0.0–0.1)
EOS%: 2.2 % (ref 0.0–7.0)
Eosinophils Absolute: 0.2 10*3/uL (ref 0.0–0.5)
HCT: 44.7 % (ref 38.7–49.9)
HGB: 15.8 g/dL (ref 13.0–17.1)
MCH: 33.7 pg — ABNORMAL HIGH (ref 28.0–33.4)
MONO#: 0.6 10*3/uL (ref 0.1–0.9)
NEUT#: 7.8 10*3/uL — ABNORMAL HIGH (ref 1.5–6.5)
RDW: 11.8 % (ref 11.2–14.6)
WBC: 10.7 10*3/uL — ABNORMAL HIGH (ref 4.0–10.0)
lymph#: 1.9 10*3/uL (ref 0.9–3.3)

## 2006-11-01 LAB — CBC WITH DIFFERENTIAL/PLATELET
Basophils Absolute: 0.1 10*3/uL (ref 0.0–0.1)
EOS%: 5 % (ref 0.0–7.0)
HGB: 15.2 g/dL (ref 13.0–17.1)
LYMPH%: 35.3 % (ref 14.0–48.0)
MCH: 34.6 pg — ABNORMAL HIGH (ref 28.0–33.4)
MCV: 98.2 fL — ABNORMAL HIGH (ref 81.6–98.0)
MONO%: 9.2 % (ref 0.0–13.0)
NEUT%: 49.1 % (ref 40.0–75.0)
RDW: 12 % (ref 11.2–14.6)

## 2006-11-01 LAB — COMPREHENSIVE METABOLIC PANEL
Alkaline Phosphatase: 77 U/L (ref 39–117)
BUN: 26 mg/dL — ABNORMAL HIGH (ref 6–23)
Creatinine, Ser: 1.16 mg/dL (ref 0.40–1.50)
Glucose, Bld: 81 mg/dL (ref 70–99)
Sodium: 140 mEq/L (ref 135–145)
Total Bilirubin: 0.5 mg/dL (ref 0.3–1.2)

## 2006-11-20 ENCOUNTER — Ambulatory Visit: Payer: Self-pay | Admitting: Internal Medicine

## 2006-11-22 LAB — COMPREHENSIVE METABOLIC PANEL
CO2: 23 mEq/L (ref 19–32)
Calcium: 9.1 mg/dL (ref 8.4–10.5)
Chloride: 105 mEq/L (ref 96–112)
Creatinine, Ser: 1.16 mg/dL (ref 0.40–1.50)
Glucose, Bld: 92 mg/dL (ref 70–99)
Total Bilirubin: 0.5 mg/dL (ref 0.3–1.2)
Total Protein: 7.3 g/dL (ref 6.0–8.3)

## 2006-11-22 LAB — CBC WITH DIFFERENTIAL/PLATELET
Basophils Absolute: 0.1 10*3/uL (ref 0.0–0.1)
Eosinophils Absolute: 0.2 10*3/uL (ref 0.0–0.5)
HCT: 45 % (ref 38.7–49.9)
HGB: 16.2 g/dL (ref 13.0–17.1)
LYMPH%: 31.9 % (ref 14.0–48.0)
MCV: 97.1 fL (ref 81.6–98.0)
MONO#: 0.5 10*3/uL (ref 0.1–0.9)
MONO%: 9 % (ref 0.0–13.0)
NEUT#: 3.1 10*3/uL (ref 1.5–6.5)
Platelets: 174 10*3/uL (ref 145–400)

## 2006-11-30 ENCOUNTER — Ambulatory Visit: Payer: Self-pay | Admitting: Thoracic Surgery

## 2006-11-30 ENCOUNTER — Encounter: Admission: RE | Admit: 2006-11-30 | Discharge: 2006-11-30 | Payer: Self-pay | Admitting: Thoracic Surgery

## 2006-12-13 LAB — CBC WITH DIFFERENTIAL/PLATELET
BASO%: 1.5 % (ref 0.0–2.0)
Basophils Absolute: 0.1 10*3/uL (ref 0.0–0.1)
EOS%: 2.8 % (ref 0.0–7.0)
HCT: 45.2 % (ref 38.7–49.9)
LYMPH%: 25.3 % (ref 14.0–48.0)
MCH: 34.1 pg — ABNORMAL HIGH (ref 28.0–33.4)
MCHC: 36.2 g/dL — ABNORMAL HIGH (ref 32.0–35.9)
MCV: 94.2 fL (ref 81.6–98.0)
NEUT%: 63.4 % (ref 40.0–75.0)
Platelets: 180 10*3/uL (ref 145–400)

## 2006-12-13 LAB — COMPREHENSIVE METABOLIC PANEL
AST: 18 U/L (ref 0–37)
Alkaline Phosphatase: 73 U/L (ref 39–117)
BUN: 22 mg/dL (ref 6–23)
Calcium: 8.9 mg/dL (ref 8.4–10.5)
Creatinine, Ser: 1.07 mg/dL (ref 0.40–1.50)
Glucose, Bld: 80 mg/dL (ref 70–99)

## 2006-12-29 ENCOUNTER — Ambulatory Visit (HOSPITAL_COMMUNITY): Admission: RE | Admit: 2006-12-29 | Discharge: 2006-12-29 | Payer: Self-pay | Admitting: Internal Medicine

## 2007-01-03 LAB — CBC WITH DIFFERENTIAL/PLATELET
Basophils Absolute: 0.1 10*3/uL (ref 0.0–0.1)
EOS%: 3.3 % (ref 0.0–7.0)
Eosinophils Absolute: 0.2 10*3/uL (ref 0.0–0.5)
HCT: 42.2 % (ref 38.7–49.9)
HGB: 15.1 g/dL (ref 13.0–17.1)
MCH: 34.3 pg — ABNORMAL HIGH (ref 28.0–33.4)
MCV: 96.1 fL (ref 81.6–98.0)
NEUT#: 3.4 10*3/uL (ref 1.5–6.5)
NEUT%: 58.1 % (ref 40.0–75.0)
RDW: 11.7 % (ref 11.2–14.6)
lymph#: 1.7 10*3/uL (ref 0.9–3.3)

## 2007-01-03 LAB — COMPREHENSIVE METABOLIC PANEL
ALT: 12 U/L (ref 0–53)
Albumin: 4 g/dL (ref 3.5–5.2)
BUN: 22 mg/dL (ref 6–23)
CO2: 22 mEq/L (ref 19–32)
Calcium: 8.8 mg/dL (ref 8.4–10.5)
Chloride: 103 mEq/L (ref 96–112)
Creatinine, Ser: 1.12 mg/dL (ref 0.40–1.50)
Potassium: 4.5 mEq/L (ref 3.5–5.3)

## 2007-01-22 ENCOUNTER — Ambulatory Visit: Payer: Self-pay | Admitting: Internal Medicine

## 2007-01-24 LAB — COMPREHENSIVE METABOLIC PANEL
ALT: 12 U/L (ref 0–53)
AST: 16 U/L (ref 0–37)
Albumin: 4.2 g/dL (ref 3.5–5.2)
Alkaline Phosphatase: 74 U/L (ref 39–117)
Calcium: 9.3 mg/dL (ref 8.4–10.5)
Chloride: 104 mEq/L (ref 96–112)
Potassium: 5 mEq/L (ref 3.5–5.3)
Sodium: 138 mEq/L (ref 135–145)
Total Protein: 7.4 g/dL (ref 6.0–8.3)

## 2007-01-24 LAB — CBC WITH DIFFERENTIAL/PLATELET
BASO%: 1.2 % (ref 0.0–2.0)
EOS%: 2.9 % (ref 0.0–7.0)
HCT: 45.5 % (ref 38.7–49.9)
MCH: 33.8 pg — ABNORMAL HIGH (ref 28.0–33.4)
MCHC: 35.1 g/dL (ref 32.0–35.9)
MONO#: 0.4 10*3/uL (ref 0.1–0.9)
NEUT%: 65 % (ref 40.0–75.0)
RDW: 11.7 % (ref 11.2–14.6)
WBC: 6.3 10*3/uL (ref 4.0–10.0)
lymph#: 1.5 10*3/uL (ref 0.9–3.3)

## 2007-01-24 LAB — UA PROTEIN, DIPSTICK - CHCC: Protein, Urine: <30 mg/dL

## 2007-02-13 LAB — CBC WITH DIFFERENTIAL/PLATELET
BASO%: 1.3 % (ref 0.0–2.0)
Basophils Absolute: 0.1 10*3/uL (ref 0.0–0.1)
EOS%: 3.6 % (ref 0.0–7.0)
HGB: 15.9 g/dL (ref 13.0–17.1)
MCH: 33.9 pg — ABNORMAL HIGH (ref 28.0–33.4)
MCHC: 35.9 g/dL (ref 32.0–35.9)
MCV: 94.4 fL (ref 81.6–98.0)
MONO%: 8.1 % (ref 0.0–13.0)
RDW: 11.4 % (ref 11.2–14.6)
lymph#: 1.8 10*3/uL (ref 0.9–3.3)

## 2007-02-13 LAB — COMPREHENSIVE METABOLIC PANEL
CO2: 24 mEq/L (ref 19–32)
Calcium: 8.9 mg/dL (ref 8.4–10.5)
Chloride: 106 mEq/L (ref 96–112)
Creatinine, Ser: 1.18 mg/dL (ref 0.40–1.50)
Glucose, Bld: 94 mg/dL (ref 70–99)
Total Bilirubin: 0.6 mg/dL (ref 0.3–1.2)

## 2007-03-02 ENCOUNTER — Ambulatory Visit (HOSPITAL_COMMUNITY): Admission: RE | Admit: 2007-03-02 | Discharge: 2007-03-02 | Payer: Self-pay | Admitting: Internal Medicine

## 2007-03-05 LAB — CBC WITH DIFFERENTIAL/PLATELET
BASO%: 1.2 % (ref 0.0–2.0)
EOS%: 3.8 % (ref 0.0–7.0)
LYMPH%: 39.1 % (ref 14.0–48.0)
MCHC: 35.2 g/dL (ref 32.0–35.9)
MCV: 96.2 fL (ref 81.6–98.0)
MONO%: 9.8 % (ref 0.0–13.0)
Platelets: 177 10*3/uL (ref 145–400)
RBC: 4.79 10*6/uL (ref 4.20–5.71)

## 2007-03-05 LAB — UA PROTEIN, DIPSTICK - CHCC: Protein, Urine: NEGATIVE mg/dL

## 2007-03-05 LAB — COMPREHENSIVE METABOLIC PANEL
ALT: 16 U/L (ref 0–53)
AST: 18 U/L (ref 0–37)
Alkaline Phosphatase: 64 U/L (ref 39–117)
Creatinine, Ser: 1.2 mg/dL (ref 0.40–1.50)
Total Bilirubin: 0.6 mg/dL (ref 0.3–1.2)

## 2007-03-26 ENCOUNTER — Ambulatory Visit: Payer: Self-pay | Admitting: Internal Medicine

## 2007-03-27 ENCOUNTER — Encounter: Admission: RE | Admit: 2007-03-27 | Discharge: 2007-03-27 | Payer: Self-pay | Admitting: Thoracic Surgery

## 2007-03-27 ENCOUNTER — Ambulatory Visit: Payer: Self-pay | Admitting: Thoracic Surgery

## 2007-03-28 LAB — CBC WITH DIFFERENTIAL/PLATELET
Basophils Absolute: 0.1 10*3/uL (ref 0.0–0.1)
EOS%: 5.3 % (ref 0.0–7.0)
HGB: 15.5 g/dL (ref 13.0–17.1)
LYMPH%: 29 % (ref 14.0–48.0)
MCH: 33.6 pg — ABNORMAL HIGH (ref 28.0–33.4)
MCV: 98.1 fL — ABNORMAL HIGH (ref 81.6–98.0)
MONO%: 8.1 % (ref 0.0–13.0)
NEUT%: 56.3 % (ref 40.0–75.0)
Platelets: 170 10*3/uL (ref 145–400)
RDW: 13.2 % (ref 11.2–14.6)

## 2007-03-28 LAB — COMPREHENSIVE METABOLIC PANEL
ALT: 12 U/L (ref 0–53)
Albumin: 3.8 g/dL (ref 3.5–5.2)
Alkaline Phosphatase: 66 U/L (ref 39–117)
CO2: 24 mEq/L (ref 19–32)
Potassium: 4.3 mEq/L (ref 3.5–5.3)
Sodium: 139 mEq/L (ref 135–145)
Total Bilirubin: 0.4 mg/dL (ref 0.3–1.2)
Total Protein: 6.5 g/dL (ref 6.0–8.3)

## 2007-04-18 LAB — CBC WITH DIFFERENTIAL/PLATELET
Basophils Absolute: 0 10*3/uL (ref 0.0–0.1)
Eosinophils Absolute: 0.3 10*3/uL (ref 0.0–0.5)
HCT: 43.7 % (ref 38.7–49.9)
HGB: 15.4 g/dL (ref 13.0–17.1)
LYMPH%: 26 % (ref 14.0–48.0)
MCHC: 35.2 g/dL (ref 32.0–35.9)
MONO#: 0.7 10*3/uL (ref 0.1–0.9)
NEUT%: 62.2 % (ref 40.0–75.0)
Platelets: 162 10*3/uL (ref 145–400)
WBC: 8.3 10*3/uL (ref 4.0–10.0)

## 2007-04-18 LAB — COMPREHENSIVE METABOLIC PANEL
Albumin: 3.8 g/dL (ref 3.5–5.2)
BUN: 14 mg/dL (ref 6–23)
CO2: 24 mEq/L (ref 19–32)
Calcium: 8.9 mg/dL (ref 8.4–10.5)
Chloride: 103 mEq/L (ref 96–112)
Creatinine, Ser: 1.01 mg/dL (ref 0.40–1.50)
Glucose, Bld: 90 mg/dL (ref 70–99)

## 2007-05-04 ENCOUNTER — Ambulatory Visit (HOSPITAL_COMMUNITY): Admission: RE | Admit: 2007-05-04 | Discharge: 2007-05-04 | Payer: Self-pay | Admitting: Internal Medicine

## 2007-05-07 ENCOUNTER — Ambulatory Visit: Payer: Self-pay | Admitting: Internal Medicine

## 2007-05-09 LAB — CBC WITH DIFFERENTIAL/PLATELET
BASO%: 1.2 % (ref 0.0–2.0)
EOS%: 3.4 % (ref 0.0–7.0)
MCH: 33.5 pg — ABNORMAL HIGH (ref 28.0–33.4)
MCHC: 34.3 g/dL (ref 32.0–35.9)
RDW: 13.7 % (ref 11.2–14.6)
lymph#: 2.3 10*3/uL (ref 0.9–3.3)

## 2007-05-09 LAB — COMPREHENSIVE METABOLIC PANEL
AST: 17 U/L (ref 0–37)
Albumin: 3.7 g/dL (ref 3.5–5.2)
Alkaline Phosphatase: 74 U/L (ref 39–117)
BUN: 16 mg/dL (ref 6–23)
Potassium: 4.2 mEq/L (ref 3.5–5.3)
Sodium: 138 mEq/L (ref 135–145)

## 2007-06-18 ENCOUNTER — Ambulatory Visit: Payer: Self-pay | Admitting: Internal Medicine

## 2007-06-20 LAB — COMPREHENSIVE METABOLIC PANEL
ALT: 14 U/L (ref 0–53)
Albumin: 3.9 g/dL (ref 3.5–5.2)
BUN: 21 mg/dL (ref 6–23)
CO2: 22 mEq/L (ref 19–32)
Calcium: 9 mg/dL (ref 8.4–10.5)
Chloride: 109 mEq/L (ref 96–112)
Creatinine, Ser: 1.31 mg/dL (ref 0.40–1.50)

## 2007-06-20 LAB — CBC WITH DIFFERENTIAL/PLATELET
Eosinophils Absolute: 0.3 10*3/uL (ref 0.0–0.5)
HCT: 46.4 % (ref 38.7–49.9)
LYMPH%: 30.2 % (ref 14.0–48.0)
MONO#: 0.5 10*3/uL (ref 0.1–0.9)
NEUT#: 3.6 10*3/uL (ref 1.5–6.5)
NEUT%: 55.6 % (ref 40.0–75.0)
Platelets: 191 10*3/uL (ref 145–400)
WBC: 6.5 10*3/uL (ref 4.0–10.0)

## 2007-06-20 LAB — UA PROTEIN, DIPSTICK - CHCC: Protein, Urine: <30 mg/dL

## 2007-07-09 ENCOUNTER — Ambulatory Visit (HOSPITAL_COMMUNITY): Admission: RE | Admit: 2007-07-09 | Discharge: 2007-07-09 | Payer: Self-pay | Admitting: Internal Medicine

## 2007-07-11 LAB — CBC WITH DIFFERENTIAL/PLATELET
BASO%: 1.6 % (ref 0.0–2.0)
Eosinophils Absolute: 0.3 10*3/uL (ref 0.0–0.5)
LYMPH%: 30.4 % (ref 14.0–48.0)
MCHC: 34.3 g/dL (ref 32.0–35.9)
MCV: 98.8 fL — ABNORMAL HIGH (ref 81.6–98.0)
MONO%: 9.4 % (ref 0.0–13.0)
Platelets: 184 10*3/uL (ref 145–400)
RBC: 4.66 10*6/uL (ref 4.20–5.71)

## 2007-07-11 LAB — COMPREHENSIVE METABOLIC PANEL
ALT: 13 U/L (ref 0–53)
CO2: 22 mEq/L (ref 19–32)
Creatinine, Ser: 1.33 mg/dL (ref 0.40–1.50)
Total Bilirubin: 0.4 mg/dL (ref 0.3–1.2)

## 2007-07-11 LAB — UA PROTEIN, DIPSTICK - CHCC: Protein, Urine: <30 mg/dL

## 2007-07-30 ENCOUNTER — Ambulatory Visit: Payer: Self-pay | Admitting: Internal Medicine

## 2007-08-01 LAB — COMPREHENSIVE METABOLIC PANEL
BUN: 20 mg/dL (ref 6–23)
CO2: 23 mEq/L (ref 19–32)
Creatinine, Ser: 1.17 mg/dL (ref 0.40–1.50)
Glucose, Bld: 92 mg/dL (ref 70–99)
Total Bilirubin: 0.5 mg/dL (ref 0.3–1.2)

## 2007-08-01 LAB — CBC WITH DIFFERENTIAL/PLATELET
BASO%: 1.3 % (ref 0.0–2.0)
HCT: 44.7 % (ref 38.7–49.9)
LYMPH%: 28.3 % (ref 14.0–48.0)
MCHC: 34.4 g/dL (ref 32.0–35.9)
MONO#: 0.6 10*3/uL (ref 0.1–0.9)
NEUT%: 57.2 % (ref 40.0–75.0)
Platelets: 174 10*3/uL (ref 145–400)
WBC: 7.1 10*3/uL (ref 4.0–10.0)

## 2007-08-22 LAB — CBC WITH DIFFERENTIAL/PLATELET
Basophils Absolute: 0.1 10*3/uL (ref 0.0–0.1)
EOS%: 4.4 % (ref 0.0–7.0)
HGB: 15.6 g/dL (ref 13.0–17.1)
MCH: 34 pg — ABNORMAL HIGH (ref 28.0–33.4)
MCHC: 34.7 g/dL (ref 32.0–35.9)
MCV: 97.8 fL (ref 81.6–98.0)
MONO%: 8.3 % (ref 0.0–13.0)
RDW: 13.3 % (ref 11.2–14.6)

## 2007-08-22 LAB — COMPREHENSIVE METABOLIC PANEL
AST: 25 U/L (ref 0–37)
Albumin: 3.8 g/dL (ref 3.5–5.2)
Alkaline Phosphatase: 69 U/L (ref 39–117)
Calcium: 8.9 mg/dL (ref 8.4–10.5)
Chloride: 104 mEq/L (ref 96–112)
Potassium: 4.7 mEq/L (ref 3.5–5.3)
Sodium: 135 mEq/L (ref 135–145)
Total Protein: 6.6 g/dL (ref 6.0–8.3)

## 2007-09-07 ENCOUNTER — Ambulatory Visit (HOSPITAL_COMMUNITY): Admission: RE | Admit: 2007-09-07 | Discharge: 2007-09-07 | Payer: Self-pay | Admitting: Internal Medicine

## 2007-09-12 LAB — CBC WITH DIFFERENTIAL/PLATELET
BASO%: 1.2 % (ref 0.0–2.0)
EOS%: 4.6 % (ref 0.0–7.0)
LYMPH%: 32.7 % (ref 14.0–48.0)
MCH: 33.7 pg — ABNORMAL HIGH (ref 28.0–33.4)
MCHC: 34.5 g/dL (ref 32.0–35.9)
MCV: 97.6 fL (ref 81.6–98.0)
MONO%: 9.3 % (ref 0.0–13.0)
Platelets: 186 10*3/uL (ref 145–400)
RBC: 4.56 10*6/uL (ref 4.20–5.71)

## 2007-09-12 LAB — COMPREHENSIVE METABOLIC PANEL
ALT: 15 U/L (ref 0–53)
AST: 16 U/L (ref 0–37)
Alkaline Phosphatase: 77 U/L (ref 39–117)
CO2: 23 mEq/L (ref 19–32)
Creatinine, Ser: 1.18 mg/dL (ref 0.40–1.50)
Total Bilirubin: 0.5 mg/dL (ref 0.3–1.2)

## 2007-09-12 LAB — UA PROTEIN, DIPSTICK - CHCC: Protein, Urine: 30 mg/dL

## 2007-09-19 ENCOUNTER — Encounter: Admission: RE | Admit: 2007-09-19 | Discharge: 2007-09-19 | Payer: Self-pay | Admitting: Thoracic Surgery

## 2007-09-19 ENCOUNTER — Ambulatory Visit: Payer: Self-pay | Admitting: Thoracic Surgery

## 2007-10-01 ENCOUNTER — Ambulatory Visit: Payer: Self-pay | Admitting: Internal Medicine

## 2007-10-03 LAB — CBC WITH DIFFERENTIAL/PLATELET
BASO%: 1.6 % (ref 0.0–2.0)
EOS%: 5.1 % (ref 0.0–7.0)
MCH: 33.4 pg (ref 28.0–33.4)
MCHC: 34 g/dL (ref 32.0–35.9)
MONO#: 0.5 10*3/uL (ref 0.1–0.9)
RBC: 4.85 10*6/uL (ref 4.20–5.71)
RDW: 13.2 % (ref 11.2–14.6)
WBC: 5.6 10*3/uL (ref 4.0–10.0)
lymph#: 1.6 10*3/uL (ref 0.9–3.3)

## 2007-10-03 LAB — COMPREHENSIVE METABOLIC PANEL
ALT: 13 U/L (ref 0–53)
AST: 15 U/L (ref 0–37)
Albumin: 3.9 g/dL (ref 3.5–5.2)
Alkaline Phosphatase: 67 U/L (ref 39–117)
BUN: 24 mg/dL — ABNORMAL HIGH (ref 6–23)
Potassium: 4.3 mEq/L (ref 3.5–5.3)
Sodium: 136 mEq/L (ref 135–145)
Total Protein: 6.7 g/dL (ref 6.0–8.3)

## 2007-10-03 LAB — UA PROTEIN, DIPSTICK - CHCC: Protein, Urine: <30 mg/dL

## 2007-10-25 LAB — CBC WITH DIFFERENTIAL/PLATELET
Eosinophils Absolute: 0.3 10*3/uL (ref 0.0–0.5)
MCV: 97.6 fL (ref 81.6–98.0)
MONO%: 6.8 % (ref 0.0–13.0)
NEUT#: 3.4 10*3/uL (ref 1.5–6.5)
RBC: 4.8 10*6/uL (ref 4.20–5.71)
RDW: 13.6 % (ref 11.2–14.6)
WBC: 5.8 10*3/uL (ref 4.0–10.0)
lymph#: 1.6 10*3/uL (ref 0.9–3.3)

## 2007-10-25 LAB — UA PROTEIN, DIPSTICK - CHCC: Protein, Urine: <30 mg/dL

## 2007-10-25 LAB — COMPREHENSIVE METABOLIC PANEL
AST: 20 U/L (ref 0–37)
Albumin: 3.8 g/dL (ref 3.5–5.2)
Alkaline Phosphatase: 60 U/L (ref 39–117)
Glucose, Bld: 81 mg/dL (ref 70–99)
Potassium: 4.3 mEq/L (ref 3.5–5.3)
Sodium: 138 mEq/L (ref 135–145)
Total Protein: 6.4 g/dL (ref 6.0–8.3)

## 2007-11-12 ENCOUNTER — Ambulatory Visit (HOSPITAL_COMMUNITY): Admission: RE | Admit: 2007-11-12 | Discharge: 2007-11-12 | Payer: Self-pay | Admitting: Internal Medicine

## 2007-11-14 LAB — COMPREHENSIVE METABOLIC PANEL
ALT: 16 U/L (ref 0–53)
AST: 21 U/L (ref 0–37)
Albumin: 4 g/dL (ref 3.5–5.2)
BUN: 16 mg/dL (ref 6–23)
CO2: 24 mEq/L (ref 19–32)
Calcium: 8.7 mg/dL (ref 8.4–10.5)
Chloride: 107 mEq/L (ref 96–112)
Potassium: 4.9 mEq/L (ref 3.5–5.3)

## 2007-11-14 LAB — CBC WITH DIFFERENTIAL/PLATELET
EOS%: 4.4 % (ref 0.0–7.0)
MCH: 33.1 pg (ref 28.0–33.4)
MCV: 96.8 fL (ref 81.6–98.0)
MONO%: 8 % (ref 0.0–13.0)
NEUT#: 3 10*3/uL (ref 1.5–6.5)
RBC: 4.89 10*6/uL (ref 4.20–5.71)
RDW: 13.3 % (ref 11.2–14.6)

## 2007-11-14 LAB — UA PROTEIN, DIPSTICK - CHCC: Protein, Urine: 30 mg/dL

## 2007-11-28 ENCOUNTER — Ambulatory Visit: Payer: Self-pay | Admitting: Thoracic Surgery

## 2007-12-03 ENCOUNTER — Ambulatory Visit: Payer: Self-pay | Admitting: Internal Medicine

## 2007-12-05 LAB — COMPREHENSIVE METABOLIC PANEL
BUN: 20 mg/dL (ref 6–23)
CO2: 23 mEq/L (ref 19–32)
Creatinine, Ser: 1.19 mg/dL (ref 0.40–1.50)
Glucose, Bld: 95 mg/dL (ref 70–99)
Total Bilirubin: 0.6 mg/dL (ref 0.3–1.2)
Total Protein: 6.7 g/dL (ref 6.0–8.3)

## 2007-12-05 LAB — CBC WITH DIFFERENTIAL/PLATELET
Basophils Absolute: 0 10*3/uL (ref 0.0–0.1)
Eosinophils Absolute: 0.3 10*3/uL (ref 0.0–0.5)
HGB: 15.6 g/dL (ref 13.0–17.1)
LYMPH%: 30.5 % (ref 14.0–48.0)
MONO#: 0.4 10*3/uL (ref 0.1–0.9)
NEUT#: 3 10*3/uL (ref 1.5–6.5)
Platelets: 191 10*3/uL (ref 145–400)
RBC: 4.56 10*6/uL (ref 4.20–5.71)
WBC: 5.4 10*3/uL (ref 4.0–10.0)

## 2007-12-05 LAB — UA PROTEIN, DIPSTICK - CHCC: Protein, Urine: 30 mg/dL

## 2007-12-26 LAB — CBC WITH DIFFERENTIAL/PLATELET
BASO%: 1.6 % (ref 0.0–2.0)
Basophils Absolute: 0.1 10*3/uL (ref 0.0–0.1)
EOS%: 5.4 % (ref 0.0–7.0)
HGB: 15.9 g/dL (ref 13.0–17.1)
MCH: 33.5 pg — ABNORMAL HIGH (ref 28.0–33.4)
MCHC: 34.6 g/dL (ref 32.0–35.9)
MCV: 96.7 fL (ref 81.6–98.0)
MONO%: 6.2 % (ref 0.0–13.0)
RDW: 13.3 % (ref 11.2–14.6)
lymph#: 1.8 10*3/uL (ref 0.9–3.3)

## 2007-12-26 LAB — COMPREHENSIVE METABOLIC PANEL
ALT: 12 U/L (ref 0–53)
AST: 17 U/L (ref 0–37)
Albumin: 3.7 g/dL (ref 3.5–5.2)
Alkaline Phosphatase: 72 U/L (ref 39–117)
BUN: 19 mg/dL (ref 6–23)
Creatinine, Ser: 1.16 mg/dL (ref 0.40–1.50)
Potassium: 4.3 mEq/L (ref 3.5–5.3)

## 2007-12-26 LAB — UA PROTEIN, DIPSTICK - CHCC: Protein, Urine: 100 mg/dL

## 2008-01-14 ENCOUNTER — Ambulatory Visit (HOSPITAL_COMMUNITY): Admission: RE | Admit: 2008-01-14 | Discharge: 2008-01-14 | Payer: Self-pay | Admitting: Internal Medicine

## 2008-01-14 ENCOUNTER — Ambulatory Visit: Payer: Self-pay | Admitting: Internal Medicine

## 2008-01-16 LAB — CBC WITH DIFFERENTIAL/PLATELET
BASO%: 0.7 % (ref 0.0–2.0)
EOS%: 3.5 % (ref 0.0–7.0)
HCT: 46 % (ref 38.7–49.9)
LYMPH%: 20.8 % (ref 14.0–48.0)
MCH: 33.8 pg — ABNORMAL HIGH (ref 28.0–33.4)
MCHC: 35.3 g/dL (ref 32.0–35.9)
NEUT%: 67.6 % (ref 40.0–75.0)
Platelets: 185 10*3/uL (ref 145–400)
RBC: 4.81 10*6/uL (ref 4.20–5.71)
WBC: 7.8 10*3/uL (ref 4.0–10.0)

## 2008-01-16 LAB — COMPREHENSIVE METABOLIC PANEL
ALT: 13 U/L (ref 0–53)
AST: 15 U/L (ref 0–37)
Alkaline Phosphatase: 76 U/L (ref 39–117)
Creatinine, Ser: 1.21 mg/dL (ref 0.40–1.50)
Sodium: 138 mEq/L (ref 135–145)
Total Bilirubin: 0.6 mg/dL (ref 0.3–1.2)
Total Protein: 6.5 g/dL (ref 6.0–8.3)

## 2008-01-16 LAB — UA PROTEIN, DIPSTICK - CHCC: Protein, Urine: 30 mg/dL

## 2008-02-25 ENCOUNTER — Ambulatory Visit: Payer: Self-pay | Admitting: Internal Medicine

## 2008-03-14 ENCOUNTER — Ambulatory Visit (HOSPITAL_COMMUNITY): Admission: RE | Admit: 2008-03-14 | Discharge: 2008-03-14 | Payer: Self-pay | Admitting: Internal Medicine

## 2008-03-19 LAB — COMPREHENSIVE METABOLIC PANEL
ALT: 13 U/L (ref 0–53)
AST: 19 U/L (ref 0–37)
Albumin: 3.9 g/dL (ref 3.5–5.2)
Calcium: 8.7 mg/dL (ref 8.4–10.5)
Chloride: 105 mEq/L (ref 96–112)
Potassium: 4.6 mEq/L (ref 3.5–5.3)
Total Protein: 6.8 g/dL (ref 6.0–8.3)

## 2008-03-19 LAB — CBC WITH DIFFERENTIAL/PLATELET
BASO%: 1.4 % (ref 0.0–2.0)
EOS%: 3.8 % (ref 0.0–7.0)
MCH: 33.1 pg (ref 27.2–33.4)
MCHC: 34.6 g/dL (ref 32.0–36.0)
NEUT%: 61.7 % (ref 39.0–75.0)
RBC: 4.83 10*6/uL (ref 4.20–5.82)
RDW: 13.9 % (ref 11.0–14.6)
lymph#: 1.7 10*3/uL (ref 0.9–3.3)
nRBC: 0 % (ref 0–0)

## 2008-03-19 LAB — UA PROTEIN, DIPSTICK - CHCC: Protein, Urine: <30 mg/dL

## 2008-04-09 ENCOUNTER — Ambulatory Visit: Payer: Self-pay | Admitting: Internal Medicine

## 2008-04-09 LAB — COMPREHENSIVE METABOLIC PANEL
AST: 18 U/L (ref 0–37)
Albumin: 3.9 g/dL (ref 3.5–5.2)
Alkaline Phosphatase: 78 U/L (ref 39–117)
BUN: 21 mg/dL (ref 6–23)
Calcium: 8.5 mg/dL (ref 8.4–10.5)
Chloride: 105 mEq/L (ref 96–112)
Potassium: 4.2 mEq/L (ref 3.5–5.3)
Sodium: 139 mEq/L (ref 135–145)
Total Protein: 6.5 g/dL (ref 6.0–8.3)

## 2008-04-09 LAB — CBC WITH DIFFERENTIAL/PLATELET
BASO%: 1.4 % (ref 0.0–2.0)
EOS%: 4.2 % (ref 0.0–7.0)
LYMPH%: 26.3 % (ref 14.0–49.0)
MCH: 32.9 pg (ref 27.2–33.4)
MCHC: 33.7 g/dL (ref 32.0–36.0)
MCV: 97.7 fL (ref 79.3–98.0)
MONO%: 9.5 % (ref 0.0–14.0)
NEUT#: 3.4 10*3/uL (ref 1.5–6.5)
Platelets: 150 10*3/uL (ref 140–400)
RBC: 4.71 10*6/uL (ref 4.20–5.82)
RDW: 14.1 % (ref 11.0–14.6)
nRBC: 0 % (ref 0–0)

## 2008-04-30 LAB — CBC WITH DIFFERENTIAL/PLATELET
BASO%: 0.3 % (ref 0.0–2.0)
EOS%: 5.6 % (ref 0.0–7.0)
Eosinophils Absolute: 0.4 10*3/uL (ref 0.0–0.5)
MCH: 34.3 pg — ABNORMAL HIGH (ref 27.2–33.4)
MCHC: 34.8 g/dL (ref 32.0–36.0)
MCV: 98.6 fL — ABNORMAL HIGH (ref 79.3–98.0)
MONO%: 7.8 % (ref 0.0–14.0)
NEUT#: 4.3 10*3/uL (ref 1.5–6.5)
RBC: 4.44 10*6/uL (ref 4.20–5.82)
RDW: 14.7 % — ABNORMAL HIGH (ref 11.0–14.6)

## 2008-04-30 LAB — COMPREHENSIVE METABOLIC PANEL
Albumin: 3.8 g/dL (ref 3.5–5.2)
BUN: 25 mg/dL — ABNORMAL HIGH (ref 6–23)
Calcium: 8.7 mg/dL (ref 8.4–10.5)
Chloride: 105 mEq/L (ref 96–112)
Creatinine, Ser: 1.39 mg/dL (ref 0.40–1.50)
Glucose, Bld: 91 mg/dL (ref 70–99)
Potassium: 4.6 mEq/L (ref 3.5–5.3)

## 2008-05-16 ENCOUNTER — Ambulatory Visit (HOSPITAL_COMMUNITY): Admission: RE | Admit: 2008-05-16 | Discharge: 2008-05-16 | Payer: Self-pay | Admitting: Internal Medicine

## 2008-05-19 ENCOUNTER — Ambulatory Visit: Payer: Self-pay | Admitting: Internal Medicine

## 2008-05-21 LAB — CBC WITH DIFFERENTIAL/PLATELET
Basophils Absolute: 0.1 10*3/uL (ref 0.0–0.1)
Eosinophils Absolute: 0.4 10*3/uL (ref 0.0–0.5)
HGB: 15.2 g/dL (ref 13.0–17.1)
LYMPH%: 21.9 % (ref 14.0–49.0)
MONO#: 0.8 10*3/uL (ref 0.1–0.9)
NEUT#: 3.8 10*3/uL (ref 1.5–6.5)
Platelets: 176 10*3/uL (ref 140–400)
RBC: 4.51 10*6/uL (ref 4.20–5.82)
WBC: 6.4 10*3/uL (ref 4.0–10.3)

## 2008-05-21 LAB — COMPREHENSIVE METABOLIC PANEL
BUN: 35 mg/dL — ABNORMAL HIGH (ref 6–23)
CO2: 22 mEq/L (ref 19–32)
Calcium: 8.6 mg/dL (ref 8.4–10.5)
Chloride: 106 mEq/L (ref 96–112)
Creatinine, Ser: 1.59 mg/dL — ABNORMAL HIGH (ref 0.40–1.50)

## 2008-05-21 LAB — UA PROTEIN, DIPSTICK - CHCC: Protein, Urine: <30 mg/dL

## 2008-06-11 LAB — COMPREHENSIVE METABOLIC PANEL WITH GFR
ALT: 12 U/L (ref 0–53)
AST: 18 U/L (ref 0–37)
Albumin: 4 g/dL (ref 3.5–5.2)
Alkaline Phosphatase: 71 U/L (ref 39–117)
BUN: 30 mg/dL — ABNORMAL HIGH (ref 6–23)
CO2: 22 meq/L (ref 19–32)
Calcium: 8.7 mg/dL (ref 8.4–10.5)
Chloride: 107 meq/L (ref 96–112)
Creatinine, Ser: 1.6 mg/dL — ABNORMAL HIGH (ref 0.40–1.50)
Glucose, Bld: 99 mg/dL (ref 70–99)
Potassium: 4.8 meq/L (ref 3.5–5.3)
Sodium: 137 meq/L (ref 135–145)
Total Bilirubin: 0.5 mg/dL (ref 0.3–1.2)
Total Protein: 6.8 g/dL (ref 6.0–8.3)

## 2008-06-11 LAB — CBC WITH DIFFERENTIAL/PLATELET
Basophils Absolute: 0 10*3/uL (ref 0.0–0.1)
EOS%: 5.5 % (ref 0.0–7.0)
Eosinophils Absolute: 0.3 10*3/uL (ref 0.0–0.5)
HGB: 14.8 g/dL (ref 13.0–17.1)
NEUT#: 3.3 10*3/uL (ref 1.5–6.5)
RDW: 14.3 % (ref 11.0–14.6)
WBC: 5.3 10*3/uL (ref 4.0–10.3)
lymph#: 1.2 10*3/uL (ref 0.9–3.3)

## 2008-06-11 LAB — UA PROTEIN, DIPSTICK - CHCC: Protein, Urine: <30 mg/dL

## 2008-07-02 LAB — COMPREHENSIVE METABOLIC PANEL
Alkaline Phosphatase: 71 U/L (ref 39–117)
BUN: 37 mg/dL — ABNORMAL HIGH (ref 6–23)
CO2: 21 mEq/L (ref 19–32)
Creatinine, Ser: 1.73 mg/dL — ABNORMAL HIGH (ref 0.40–1.50)
Glucose, Bld: 93 mg/dL (ref 70–99)
Sodium: 139 mEq/L (ref 135–145)
Total Bilirubin: 0.4 mg/dL (ref 0.3–1.2)
Total Protein: 6.5 g/dL (ref 6.0–8.3)

## 2008-07-02 LAB — CBC WITH DIFFERENTIAL/PLATELET
Basophils Absolute: 0.1 10*3/uL (ref 0.0–0.1)
EOS%: 5.3 % (ref 0.0–7.0)
Eosinophils Absolute: 0.3 10*3/uL (ref 0.0–0.5)
HCT: 43.7 % (ref 38.4–49.9)
HGB: 14.9 g/dL (ref 13.0–17.1)
LYMPH%: 24.7 % (ref 14.0–49.0)
MCH: 33.9 pg — ABNORMAL HIGH (ref 27.2–33.4)
MCV: 99.3 fL — ABNORMAL HIGH (ref 79.3–98.0)
MONO%: 9.1 % (ref 0.0–14.0)
NEUT#: 2.9 10*3/uL (ref 1.5–6.5)
NEUT%: 59.7 % (ref 39.0–75.0)
Platelets: 156 10*3/uL (ref 140–400)

## 2008-07-18 ENCOUNTER — Ambulatory Visit: Payer: Self-pay | Admitting: Internal Medicine

## 2008-07-18 ENCOUNTER — Ambulatory Visit (HOSPITAL_COMMUNITY): Admission: RE | Admit: 2008-07-18 | Discharge: 2008-07-18 | Payer: Self-pay | Admitting: Internal Medicine

## 2008-07-23 LAB — CBC WITH DIFFERENTIAL/PLATELET
BASO%: 0.4 % (ref 0.0–2.0)
Basophils Absolute: 0 10*3/uL (ref 0.0–0.1)
EOS%: 5.6 % (ref 0.0–7.0)
MCH: 34 pg — ABNORMAL HIGH (ref 27.2–33.4)
MCHC: 34.7 g/dL (ref 32.0–36.0)
MCV: 97.8 fL (ref 79.3–98.0)
MONO%: 7.5 % (ref 0.0–14.0)
NEUT%: 63.9 % (ref 39.0–75.0)
RDW: 14.2 % (ref 11.0–14.6)
lymph#: 1.3 10*3/uL (ref 0.9–3.3)

## 2008-07-23 LAB — COMPREHENSIVE METABOLIC PANEL
AST: 17 U/L (ref 0–37)
Albumin: 3.8 g/dL (ref 3.5–5.2)
Alkaline Phosphatase: 73 U/L (ref 39–117)
BUN: 33 mg/dL — ABNORMAL HIGH (ref 6–23)
Glucose, Bld: 92 mg/dL (ref 70–99)
Potassium: 4.7 mEq/L (ref 3.5–5.3)
Sodium: 140 mEq/L (ref 135–145)
Total Bilirubin: 0.4 mg/dL (ref 0.3–1.2)

## 2008-09-01 ENCOUNTER — Ambulatory Visit: Payer: Self-pay | Admitting: Internal Medicine

## 2008-10-13 ENCOUNTER — Ambulatory Visit: Payer: Self-pay | Admitting: Internal Medicine

## 2008-10-17 ENCOUNTER — Ambulatory Visit (HOSPITAL_COMMUNITY): Admission: RE | Admit: 2008-10-17 | Discharge: 2008-10-17 | Payer: Self-pay | Admitting: Internal Medicine

## 2008-10-17 LAB — CBC WITH DIFFERENTIAL/PLATELET
BASO%: 0.4 % (ref 0.0–2.0)
EOS%: 3.5 % (ref 0.0–7.0)
HGB: 14.2 g/dL (ref 13.0–17.1)
MCH: 34.3 pg — ABNORMAL HIGH (ref 27.2–33.4)
MCHC: 34.7 g/dL (ref 32.0–36.0)
MCV: 98.8 fL — ABNORMAL HIGH (ref 79.3–98.0)
MONO%: 7.9 % (ref 0.0–14.0)
RBC: 4.14 10*6/uL — ABNORMAL LOW (ref 4.20–5.82)
RDW: 14.3 % (ref 11.0–14.6)
lymph#: 1.5 10*3/uL (ref 0.9–3.3)

## 2008-10-17 LAB — COMPREHENSIVE METABOLIC PANEL
ALT: 14 U/L (ref 0–53)
AST: 20 U/L (ref 0–37)
Albumin: 3.6 g/dL (ref 3.5–5.2)
Alkaline Phosphatase: 91 U/L (ref 39–117)
Calcium: 9 mg/dL (ref 8.4–10.5)
Chloride: 110 mEq/L (ref 96–112)
Potassium: 5.1 mEq/L (ref 3.5–5.3)
Sodium: 139 mEq/L (ref 135–145)

## 2008-11-24 ENCOUNTER — Ambulatory Visit: Payer: Self-pay | Admitting: Internal Medicine

## 2009-01-14 ENCOUNTER — Ambulatory Visit: Payer: Self-pay | Admitting: Internal Medicine

## 2009-01-19 ENCOUNTER — Ambulatory Visit (HOSPITAL_COMMUNITY): Admission: RE | Admit: 2009-01-19 | Discharge: 2009-01-19 | Payer: Self-pay | Admitting: Internal Medicine

## 2009-01-19 LAB — CBC WITH DIFFERENTIAL/PLATELET
Basophils Absolute: 0.1 10*3/uL (ref 0.0–0.1)
HCT: 40.2 % (ref 38.4–49.9)
HGB: 13.3 g/dL (ref 13.0–17.1)
LYMPH%: 24 % (ref 14.0–49.0)
MONO#: 0.5 10*3/uL (ref 0.1–0.9)
NEUT%: 61.6 % (ref 39.0–75.0)
Platelets: 259 10*3/uL (ref 140–400)
WBC: 7.9 10*3/uL (ref 4.0–10.3)
lymph#: 1.9 10*3/uL (ref 0.9–3.3)

## 2009-01-19 LAB — COMPREHENSIVE METABOLIC PANEL
AST: 26 U/L (ref 0–37)
Albumin: 3.5 g/dL (ref 3.5–5.2)
BUN: 34 mg/dL — ABNORMAL HIGH (ref 6–23)
CO2: 25 mEq/L (ref 19–32)
Calcium: 9.1 mg/dL (ref 8.4–10.5)
Chloride: 108 mEq/L (ref 96–112)
Glucose, Bld: 102 mg/dL — ABNORMAL HIGH (ref 70–99)
Potassium: 5.3 mEq/L (ref 3.5–5.3)

## 2009-02-26 ENCOUNTER — Ambulatory Visit: Payer: Self-pay | Admitting: Internal Medicine

## 2009-04-09 ENCOUNTER — Ambulatory Visit: Payer: Self-pay | Admitting: Internal Medicine

## 2009-05-21 ENCOUNTER — Ambulatory Visit: Payer: Self-pay | Admitting: Internal Medicine

## 2009-05-22 ENCOUNTER — Ambulatory Visit (HOSPITAL_COMMUNITY): Admission: RE | Admit: 2009-05-22 | Discharge: 2009-05-22 | Payer: Self-pay | Admitting: Internal Medicine

## 2009-05-22 LAB — CBC WITH DIFFERENTIAL/PLATELET
BASO%: 1.2 % (ref 0.0–2.0)
EOS%: 4.3 % (ref 0.0–7.0)
HGB: 12.8 g/dL — ABNORMAL LOW (ref 13.0–17.1)
MCH: 33.4 pg (ref 27.2–33.4)
MCHC: 34 g/dL (ref 32.0–36.0)
RDW: 14.5 % (ref 11.0–14.6)
lymph#: 1.1 10*3/uL (ref 0.9–3.3)

## 2009-05-22 LAB — COMPREHENSIVE METABOLIC PANEL
ALT: 15 U/L (ref 0–53)
AST: 21 U/L (ref 0–37)
Albumin: 3.8 g/dL (ref 3.5–5.2)
Calcium: 9.2 mg/dL (ref 8.4–10.5)
Chloride: 109 mEq/L (ref 96–112)
Potassium: 5.8 mEq/L — ABNORMAL HIGH (ref 3.5–5.3)
Sodium: 136 mEq/L (ref 135–145)

## 2009-05-27 LAB — BASIC METABOLIC PANEL
Calcium: 8.9 mg/dL (ref 8.4–10.5)
Creatinine, Ser: 1.88 mg/dL — ABNORMAL HIGH (ref 0.40–1.50)

## 2009-07-04 ENCOUNTER — Emergency Department (HOSPITAL_BASED_OUTPATIENT_CLINIC_OR_DEPARTMENT_OTHER): Admission: EM | Admit: 2009-07-04 | Discharge: 2009-07-04 | Payer: Self-pay | Admitting: Emergency Medicine

## 2009-07-06 ENCOUNTER — Ambulatory Visit: Payer: Self-pay | Admitting: Internal Medicine

## 2009-08-17 ENCOUNTER — Ambulatory Visit: Payer: Self-pay | Admitting: Internal Medicine

## 2009-09-23 ENCOUNTER — Ambulatory Visit: Payer: Self-pay | Admitting: Internal Medicine

## 2009-09-25 ENCOUNTER — Ambulatory Visit (HOSPITAL_COMMUNITY): Admission: RE | Admit: 2009-09-25 | Discharge: 2009-09-25 | Payer: Self-pay | Admitting: Internal Medicine

## 2009-09-25 LAB — CBC WITH DIFFERENTIAL/PLATELET
Basophils Absolute: 0 10*3/uL (ref 0.0–0.1)
EOS%: 3 % (ref 0.0–7.0)
HGB: 15.2 g/dL (ref 13.0–17.1)
MCH: 34.2 pg — ABNORMAL HIGH (ref 27.2–33.4)
MONO#: 0.6 10*3/uL (ref 0.1–0.9)
NEUT#: 4.5 10*3/uL (ref 1.5–6.5)
RDW: 13.1 % (ref 11.0–14.6)
WBC: 6.9 10*3/uL (ref 4.0–10.3)
lymph#: 1.6 10*3/uL (ref 0.9–3.3)

## 2009-09-25 LAB — COMPREHENSIVE METABOLIC PANEL
ALT: 20 U/L (ref 0–53)
AST: 24 U/L (ref 0–37)
Albumin: 3.9 g/dL (ref 3.5–5.2)
BUN: 18 mg/dL (ref 6–23)
Calcium: 9.6 mg/dL (ref 8.4–10.5)
Chloride: 101 mEq/L (ref 96–112)
Potassium: 3.8 mEq/L (ref 3.5–5.3)

## 2009-11-06 ENCOUNTER — Ambulatory Visit: Payer: Self-pay | Admitting: Internal Medicine

## 2009-12-21 ENCOUNTER — Ambulatory Visit: Payer: Self-pay | Admitting: Internal Medicine

## 2010-01-29 ENCOUNTER — Ambulatory Visit: Payer: Self-pay | Admitting: Internal Medicine

## 2010-02-05 ENCOUNTER — Other Ambulatory Visit: Payer: Self-pay | Admitting: Internal Medicine

## 2010-02-05 DIAGNOSIS — C3490 Malignant neoplasm of unspecified part of unspecified bronchus or lung: Secondary | ICD-10-CM

## 2010-02-07 ENCOUNTER — Encounter: Payer: Self-pay | Admitting: Internal Medicine

## 2010-02-07 ENCOUNTER — Encounter: Payer: Self-pay | Admitting: Thoracic Surgery

## 2010-03-26 ENCOUNTER — Ambulatory Visit (HOSPITAL_COMMUNITY)
Admission: RE | Admit: 2010-03-26 | Discharge: 2010-03-26 | Disposition: A | Discharge status: Home / Self Care | Payer: Medicare Other | Source: Ambulatory Visit | Attending: Internal Medicine | Admitting: Internal Medicine

## 2010-03-26 ENCOUNTER — Encounter (HOSPITAL_BASED_OUTPATIENT_CLINIC_OR_DEPARTMENT_OTHER): Payer: Medicare Other | Admitting: Internal Medicine

## 2010-03-26 ENCOUNTER — Other Ambulatory Visit: Payer: Self-pay | Admitting: Internal Medicine

## 2010-03-26 ENCOUNTER — Encounter (HOSPITAL_COMMUNITY): Payer: Self-pay

## 2010-03-26 DIAGNOSIS — K439 Ventral hernia without obstruction or gangrene: Secondary | ICD-10-CM | POA: Insufficient documentation

## 2010-03-26 DIAGNOSIS — C3410 Malignant neoplasm of upper lobe, unspecified bronchus or lung: Secondary | ICD-10-CM

## 2010-03-26 DIAGNOSIS — J984 Other disorders of lung: Secondary | ICD-10-CM | POA: Insufficient documentation

## 2010-03-26 DIAGNOSIS — K7689 Other specified diseases of liver: Secondary | ICD-10-CM | POA: Insufficient documentation

## 2010-03-26 DIAGNOSIS — C3490 Malignant neoplasm of unspecified part of unspecified bronchus or lung: Secondary | ICD-10-CM | POA: Insufficient documentation

## 2010-03-26 DIAGNOSIS — N289 Disorder of kidney and ureter, unspecified: Secondary | ICD-10-CM | POA: Insufficient documentation

## 2010-03-26 DIAGNOSIS — K8020 Calculus of gallbladder without cholecystitis without obstruction: Secondary | ICD-10-CM | POA: Insufficient documentation

## 2010-03-26 HISTORY — DX: Essential (primary) hypertension: I10

## 2010-03-26 HISTORY — DX: Malignant (primary) neoplasm, unspecified: C80.1

## 2010-03-26 LAB — COMPREHENSIVE METABOLIC PANEL
ALT: 17 U/L (ref 0–53)
AST: 23 U/L (ref 0–37)
Albumin: 4.2 g/dL (ref 3.5–5.2)
BUN: 26 mg/dL — ABNORMAL HIGH (ref 6–23)
Calcium: 8.9 mg/dL (ref 8.4–10.5)
Chloride: 100 mEq/L (ref 96–112)
Potassium: 3.7 mEq/L (ref 3.5–5.3)
Sodium: 137 mEq/L (ref 135–145)
Total Protein: 6.9 g/dL (ref 6.0–8.3)

## 2010-03-26 LAB — CBC WITH DIFFERENTIAL/PLATELET
Basophils Absolute: 0 10*3/uL (ref 0.0–0.1)
EOS%: 3.3 % (ref 0.0–7.0)
HGB: 15.3 g/dL (ref 13.0–17.1)
MCH: 32.6 pg (ref 27.2–33.4)
NEUT#: 3.6 10*3/uL (ref 1.5–6.5)
RBC: 4.69 10*6/uL (ref 4.20–5.82)
RDW: 13.9 % (ref 11.0–14.6)
lymph#: 1.4 10*3/uL (ref 0.9–3.3)

## 2010-03-31 ENCOUNTER — Other Ambulatory Visit: Payer: Self-pay | Admitting: Internal Medicine

## 2010-03-31 ENCOUNTER — Encounter (HOSPITAL_BASED_OUTPATIENT_CLINIC_OR_DEPARTMENT_OTHER): Payer: Medicare Other | Admitting: Internal Medicine

## 2010-03-31 DIAGNOSIS — C3490 Malignant neoplasm of unspecified part of unspecified bronchus or lung: Secondary | ICD-10-CM

## 2010-03-31 DIAGNOSIS — C3410 Malignant neoplasm of upper lobe, unspecified bronchus or lung: Secondary | ICD-10-CM

## 2010-06-01 NOTE — Letter (Signed)
August 03, 2006   Lajuana Matte, MD  [phone] [address]  [place], [place] [address]   Re:  ODYN, TURKO                 DOB:  1945-05-13   Dear Arbutus Ped:   I saw Mr. Spruce today.  He is getting Avastin and had a recent CT  scan that really looks good.  There is just one area of a little  inflammation in the left lower lobe, but everything appears to be  stable.  His blood pressure is 138/86, pulse is 63, respirations 18,  sats were 95%.  He is now 18 months since his lobectomy, and at least he  appears to have stable disease.  I will see him back in in 4 months with  the chest x-ray.  He will have another CT scan in approximately 3  months.   Ines Bloomer, M.D.  Electronically Signed   DPB/MEDQ  D:  08/03/2006  T:  08/04/2006  Job:  [id number]

## 2010-06-01 NOTE — Letter (Signed)
September 19, 2007   Lajuana Matte, MD  [phone] [address]  [place], [place] [address]   Re:  Wesley Harmon, Wesley Harmon                 DOB:  April 01, 1945   Dear Dr. Arbutus Ped:   The patient came today.  His chest x-ray was stable.  His blood pressure  was 160/93, pulse 56, respirations 16, sats were 96%.  Lungs were clear  to auscultation and percussion.  I reviewed his CT scan of his chest and  abdomen and particularly there is a questionable exclusion cyst around  his umbilicus.  I did not palpate any thing in that area.  His CT scan  showed no evidence of recurrence and I think we can just continue to  watch the CT scan of the abdomen to make sure that there is no change in  his cyst.  If there is, obviously that can be biopsied under ultrasound.  We will see him back again after his next CT scan.  I appreciate the  opportunity of seeing the patient.  He is doing well considering  everything.   Ines Bloomer, M.D.  Electronically Signed   DPB/MEDQ  D:  09/19/2007  T:  09/20/2007  Job:  [id number]

## 2010-06-01 NOTE — Assessment & Plan Note (Signed)
OFFICE VISIT   Wesley Harmon, Wesley Harmon  DOB:  23-Oct-1945                                        November 28, 2007  CHART #:  [id number]   The patient came today with a followup.  CT scan showed there is no  evidence of recurrence.  He is still getting Avastin, but does can be  stopped in the near future.  Overall, he is doing well.  His blood  pressure is 154/99, pulse 52, respirations 18, and sats were 93%.  I am  going to have Dr. Arbutus Ped following from now on.  I will see him back  again, if he has any future problems.   Ines Bloomer, M.D.  Electronically Signed   DPB/MEDQ  D:  11/28/2007  T:  11/28/2007  Job:  [id number]   cc:   Lajuana Matte, MD

## 2010-06-01 NOTE — Assessment & Plan Note (Signed)
OFFICE VISIT   ESPN, ZEMAN  DOB:  [birth year]                                        November 30, 2006  CHART #:  [id number]   The patient came for followup today.  His blood pressure is 141/91.  Pulse 54.  Respirations 18.  Sats were 96%.  He is still on Avastin.  His CT scan has been stable for further nodules.  He has gained 30  pounds so he is doing well overall.  The chest x-ray today was stable.  We will see him back again in 4 months with another chest x-ray.   Ines Bloomer, M.D.  Electronically Signed   DPB/MEDQ  D:  11/30/2006  T:  12/01/2006  Job:  [id number]

## 2010-06-01 NOTE — Assessment & Plan Note (Signed)
OFFICE VISIT   MOHAMUD, MROZEK  DOB:  1945-12-29                                        March 27, 2007  CHART #:  [id number]   PROGRESS NOTE:  His blood pressure was 145/84, pulse 50, respiratory  rate 18, saturations 93%.  Chest x-ray was stable. Recent CT scan showed  a 3 mm nodule on the left side that was stable. Overall, he is doing  well. He has some mild back pain. Will plan to see him back again in 6  months with a chest x-ray. He is now almost 2 1/2 years since his  surgeries.   Ines Bloomer, M.D.  Electronically Signed   DPB/MEDQ  D:  03/27/2007  T:  03/28/2007  Job:  [id number]

## 2010-06-04 NOTE — Discharge Summary (Signed)
NAME:  Wesley Harmon, Wesley Harmon NO.:  [id number]   MEDICAL RECORD NO.:  [id number]          PATIENT TYPE:  INP   LOCATION:  2031                         FACILITY:  [facility]   PHYSICIAN:  Ines Bloomer, M.D. DATE OF BIRTH:  04-24-1945   DATE OF ADMISSION:  02/01/2005  DATE OF DISCHARGE:  02/09/2005                                 DISCHARGE SUMMARY   PRIMARY ADMITTING DIAGNOSIS:  Shortness of breath.   ADDITIONAL/DISCHARGE DIAGNOSES:  1.  History of stage III non-small cell lung cancer, status post left upper      lobectomy January 11, 2005.  2.  Large pericardial effusion with tamponade.  3.  Congestive heart failure.  4.  History of postoperative atrial fibrillation, on Coumadin.  5.  Supratherapeutic INR.   PROCEDURES PERFORMED.:  1.  Subxiphoid pericardial window.  2.  Placement of a left subclavian Port-A-Cath.   HISTORY:  The patient is a 65 year old male, who is status post a recent  left upper lobectomy on January 11, 2005 for stage III non-small cell lung  cancer. Prior to surgery he completed 4 cycles of chemotherapy, and  postoperatively his course was complicated by new onset atrial fibrillation.  He was chemically converted to normal sinus rhythm, and was discharged home  on amiodarone, digoxin, Cardizem and Coumadin. He had been doing well at  home until January 11, at which time he noticed increasing shortness of  breath with minimal exertion as well as lower extremity edema, fatigue and  weakness. This has progressed, and on the date of admission he saw Dr.  Edwyna Shell in the office in follow-up and mentioned these symptoms. Dr. Edwyna Shell  felt that his symptoms were most likely secondary congestive heart failure.  A chest x-ray showed bilateral pleural effusions, left greater than right.  Also notably, his INR was supratherapeutic at 8.1 on January 15. It was Dr.  Scheryl Darter opinion that he should be admitted at this time for further  treatment and  workup.   HOSPITAL COURSE:  The patient was admitted to [facility] on February 01, 2005.  He was started on aggressive diuresis and his Coumadin was held  for supratherapeutic INR. A cardiology consult was obtained, and the patient  was seen by Dr. Gala Romney (who had previously cared for him during his last  admission). A bedside echocardiogram was performed, which showed a large  circumferential pericardial effusion, with early mild diastolic right  ventricular collapse. Because of this, the patient was taken emergently to  the operating room on February 02, 2005 and underwent a subxiphoid  pericardial window by Dr. Edwyna Shell. He tolerated the procedure well and  greater than 1000 cc of bloody fluid was drained.   Postoperatively, he was returned to the floor in stable condition and was  restarted back on diuretics. His chest tube remained in place for 48 hours.  After all drainage had ceased, this was discontinued on February 05, 2005.  His Coumadin and Cardizem were discontinued and he remained in normal sinus  rhythm without problem.   He also underwent placement of a left subclavian Port-A-Cath during this  admission,  and tolerated that well. During his entire admission he has  remained afebrile and vital signs have been stable. Currently he is  maintaining O2 saturations of greater than 90% on room air. His incision  sites are all healing well. His most recent chest x-ray looks good, with no  evidence of pneumothorax and resolving effusions. He has been ambulating  without problem and is tolerating a regular diet.   His most recent labs show hemoglobin at 12.3, hematocrit 36, white count  7.4, platelets 340. Sodium 136, potassium 3.8, BUN 15, creatinine 1.2.   It is felt that since he has remained stable and has improved significantly,  that he may be discharged home at this time.   DISCHARGE MEDICATIONS:  1.  Tylox 1-2 q.4 h. p.r.n. for pain.  2.  Neurontin 200 mg  t.i.d.  3.  Aspirin 325 mg daily.  4.  Digoxin 0.125 mg daily.  5.  Amiodarone 2 mg daily.  He is to discontinue Cardizem and Coumadin.   DISCHARGE INSTRUCTIONS:  He will see Dr. Edwyna Shell back in the office in 1 week  with a chest x-ray. Dr. Prescott Gum office will arrange a follow-up  appointment. He is asked to refrain from driving, heavy lifting or strenuous  activity. He may ambulate daily and continue using his incentive spirometer.  He will continue his same preoperative diet. He may shower daily and clean  his incisions with soap and water. Prior to discharge, areas that are skin  stapled will be removed.  The remainder will be removed by the CVTS nurse  upon his visit to Dr. Edwyna Shell. He will call if he experiences any problems in  the interim.      Coral Ceo, P.A.    ______________________________  Ines Bloomer, M.D.    GC/MEDQ  D:  02/09/2005  T:  02/09/2005  Job:  [id number]   cc:   Arvilla Meres, M.D. [facility]  [facility]  [address]  [place]  [place] [address]   Lajuana Matte, MD  Fax: [phone]   Fredia Beets, M.D.

## 2010-06-04 NOTE — Op Note (Signed)
NAME:  Wesley Harmon, Wesley Harmon NO.:  [id number]   MEDICAL RECORD NO.:  [id number]          PATIENT TYPE:  INP   LOCATION:  2031                         FACILITY:  [facility]   PHYSICIAN:  Ines Bloomer, M.D. DATE OF BIRTH:  10-22-1945   DATE OF PROCEDURE:  02/08/2005  DATE OF DISCHARGE:                                 OPERATIVE REPORT   PREOPERATIVE DIAGNOSIS:  Stage IIIA non-small-cell lung cancer, status post  left upper lobectomy.   POSTOPERATIVE DIAGNOSIS:  Stage IIIA non-small-cell lung cancer, status post  left upper lobectomy.   OPERATION:  Insertion of left subclavian Port-A-Cath.   SURGEON:  Ines Bloomer, M.D.   ANESTHESIA:  Xylocaine 1% and IV sedation.   After prepping and draping the left chest, an area was infiltrated with 1%  Xylocaine at the left infraclavicular area and a left subclavian puncture  was performed and a guidewire threaded under fluoroscopic guidance to the  right atrium.  Stab wound was made around the guidewire.  Another area was  infiltrated with 1% Xylocaine inferior to this and transverse incision was  made and then pocket was dissected out.  A 9.6 preattached Bard Port-A-Cath  was placed in the pocket and then the tubing tunneled from the pocket up to  the stab wound around the guidewire.  It was then measured appropriately to  go to the right atrial SVC junction under fluoroscopy and cut.  Over the  guidewire was passed the dilator with a peel-away sheath. The dilator and  guidewire removed.  The tubing passed through the peel-away sheath. The peel-  away sheath was removed.  The Port-A-Cath flushed easily and withdrew and  was able to irrigate easily.  Wounds were closed with 3-0 Vicryl in the  subcutaneous tissue, the Port-A-Cath had been sutured in place with a 2-0  silk and the __________ to prevent movement. Dermabond was applied to the  skin.  The Port-A-Cath was cannulated and patient was returned to the  recovery room in  stable condition.           ______________________________  Ines Bloomer, M.D.     DPB/MEDQ  D:  02/08/2005  T:  02/08/2005  Job:  [id number]   cc:   Lajuana Matte, MD  Fax: [phone]
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
## 2010-06-04 NOTE — H&P (Signed)
NAME:  Wesley Harmon, Wesley Harmon NO.:  [id number]   MEDICAL RECORD NO.:  [id number]          PATIENT TYPE:  INP   LOCATION:  2017                         FACILITY:  [facility]   PHYSICIAN:  Ines Bloomer, M.D. DATE OF BIRTH:  01-Apr-1945   DATE OF ADMISSION:  02/01/2005  DATE OF DISCHARGE:                                HISTORY & PHYSICAL   PHYSICIANS:  Cardiologist: Arvilla Meres, M.D.  Oncologist: Lajuana Matte, M.D.  Primary care physician: Fredia Beets, M.D.   CHIEF COMPLAINT:  Shortness of breath with history of lung cancer.   HISTORY OF PRESENT ILLNESS:  Wesley Harmon is a  65 year old Caucasian male  with history of stage III non-small-cell lung carcinoma who is status post 4  cycles of chemotherapy followed by left upper lobectomy with lymph node  dissection on January 11, 2005, with pathology confirming non-small-cell  lung carcinoma (T3, N2, MX).  His postoperative course was complicated by  new onset atrial fibrillation and was seen by cardiologist, Dr. Arvilla Meres.  He ultimately was discharged home on amiodarone, digoxin,  Cardizem, and Coumadin.  He has since converted to normal sinus rhythm.  Otherwise, his postoperative course was rather uneventful, and he was  discharged home on January 21, 2005.  He had been doing relatively well at  home until Thursday, January 27, 2005, when he noticed increasing shortness  of breath even with walking across the room, increasing lower extremity  edema, fatigue, weakness, occasional difficulty with swallowing including  thin liquids, decrease in appetite, and right shoulder pain situated between  his shoulder  and neck which have been more present since using his walker  this past Saturday, January 13, due to increasing weakness.  Today, February 01, 2005, he saw Dr. Norton Blizzard in followup and discussed these  symptoms. Dr. Edwyna Shell felt he was most likely suffering from congestive heart  failure.  Chest  x-ray showed bilateral pleural effusions, left greater than  right.  Of note, his INR at [facility] on January 15 was  supratherapeutic at 8.1.   PAST MEDICAL HISTORY:  Non-small-cell lung carcinoma as described above.   PAST SURGICAL HISTORY:  Left upper lobectomy with lymph node dissection  January 11, 2005.   ALLERGIES:  No known drug allergies.   MEDICATIONS:  1.  Coumadin 2.5 mg daily and as directed, none since January 30, 2005.  2.  Neurontin 200 mg p.o. 3 times a day.  3.  Digoxin 0.125 mg p.o. daily.  4.  Aspirin 325 mg p.o. daily.  5.  Cardizem 240 mg p.o. daily.  6.  Amiodarone 400 mg p.o. daily.   REVIEW OF SYSTEMS:  Please refer to History of Present Illness for pertinent  positives and negatives.  In addition, he has recently had some problems  with constipation requiring intermittent use of Ex-Lax.   SOCIAL HISTORY:  He is married with 2 children.  He has a history of smoking  1/2 pack of cigarettes per day.  He does not use alcohol on a regular basis.  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  He lives in [place], [place]  [place].   FAMILY HISTORY:  Positive for cardiac disease and negative for cancer.   PHYSICAL EXAMINATION:  VITAL SIGNS: Blood pressure 122/68, heart rate 88,  respirations 18, oxygen saturation 92% on room air.  GENERAL APPEARANCE:  This is a 65 year old Caucasian male who is alert,  cooperative, and pleasant, but appears pale and rather deconditioned.  He is  being examined while sitting in a wheelchair.  HEENT:  Head is normocephalic and atraumatic.  NECK: Supple with no thyromegaly noted.  RESPIRATORY: Lung sounds are diminished with fine crackles at the bases.  Currently he appears to be breathing comfortably and is wearing oxygen per  nasal cannula at 2 liters.  CARDIAC:  His heart has a regular rate and rhythm with sounds rather  distant.  No murmurs were auscultated.  ABDOMEN:  Soft, nontender, with hypoactive bowel  sounds.  GU/RECTAL: These exams were deferred.  EXTREMITIES:  Show 2+ ankle edema which is pitting.  His extremities fell  warm.  He has 1+ radial and posterior tibial pulses. There is no tenderness  at the right shoulder joint; however, his trapezius muscle does seem tense  on the right side; otherwise, there is no gross deformity noted.  NEUROLOGIC:  Exam seems grossly intact.  He is alert and oriented, and  speech is clear.   ASSESSMENT:  Stage III lung carcinoma status post left upper lobectomy and  chemotherapy, now with new onset of symptoms consistent with congestive  heart failure.  He also has known history of recent atrial fibrillation  which has since resolved, and he now has a supratherapeutic INR. By report,  he also has swallowing dysfunction and right shoulder pain which appears to  be musculoskeletal, probably muscle spasm in nature.   PLAN:  1.  He will be admitted to [facility] from the CVTS office on      February 01, 2005, to the telemetry unit 2000 under the care of Dr. Algis Downs.      Karle Plumber.  2.  He will be started on Lasix.  Labs will include a BMP, CBC, CMET, INR,      and digoxin level.  3.  [facility] has been notified of his admission and will see him      in consultation.  4.  In the meantime, we will hold his Coumadin, particularly since he is now      in normal sinus rhythm.  5.  Dr. Edwyna Shell also wishes to hold Cardizem for now until seen by      cardiology.  6.  We will get a speech therapy consult to perform a swallowing evaluation.  7.  We will also get speech therapy and occupational therapy to perform      evaluation.  8.  Will prescribe Flexeril p.r.n. for muscle spasm.      Jerold Coombe, P.A.    ______________________________  Ines Bloomer, M.D.    AWZ/MEDQ  D:  02/01/2005  T:  02/01/2005  Job:  [id number]   cc:   Arvilla Meres, M.D. [facility]  [facility]  [address]  [place]  [place] [address]  Lajuana Matte, MD  Fax: [phone]   Fredia Beets, M.D.

## 2010-09-24 ENCOUNTER — Other Ambulatory Visit (HOSPITAL_COMMUNITY): Payer: BC Managed Care – PPO

## 2010-09-27 ENCOUNTER — Other Ambulatory Visit (HOSPITAL_COMMUNITY): Payer: BC Managed Care – PPO

## 2010-09-28 ENCOUNTER — Other Ambulatory Visit: Payer: Self-pay | Admitting: Internal Medicine

## 2010-09-28 ENCOUNTER — Encounter (HOSPITAL_BASED_OUTPATIENT_CLINIC_OR_DEPARTMENT_OTHER): Payer: Medicare Other | Admitting: Internal Medicine

## 2010-09-28 DIAGNOSIS — Z452 Encounter for adjustment and management of vascular access device: Secondary | ICD-10-CM

## 2010-09-28 DIAGNOSIS — C3410 Malignant neoplasm of upper lobe, unspecified bronchus or lung: Secondary | ICD-10-CM

## 2010-09-28 DIAGNOSIS — C3490 Malignant neoplasm of unspecified part of unspecified bronchus or lung: Secondary | ICD-10-CM

## 2010-09-28 LAB — CMP (CANCER CENTER ONLY)
ALT(SGPT): 20 U/L (ref 10–47)
Albumin: 3.3 g/dL (ref 3.3–5.5)
CO2: 27 mEq/L (ref 18–33)
Calcium: 8.3 mg/dL (ref 8.0–10.3)
Chloride: 99 mEq/L (ref 98–108)
Glucose, Bld: 109 mg/dL (ref 73–118)
Sodium: 139 mEq/L (ref 128–145)
Total Bilirubin: 0.7 mg/dl (ref 0.20–1.60)
Total Protein: 6.7 g/dL (ref 6.4–8.1)

## 2010-09-28 LAB — CBC WITH DIFFERENTIAL/PLATELET
BASO%: 2 % (ref 0.0–2.0)
Eosinophils Absolute: 0.2 10*3/uL (ref 0.0–0.5)
HCT: 47.9 % (ref 38.4–49.9)
LYMPH%: 26.2 % (ref 14.0–49.0)
MONO#: 0.5 10*3/uL (ref 0.1–0.9)
NEUT#: 3.7 10*3/uL (ref 1.5–6.5)
NEUT%: 60.7 % (ref 39.0–75.0)
Platelets: 183 10*3/uL (ref 140–400)
RBC: 4.97 10*6/uL (ref 4.20–5.82)
WBC: 6.2 10*3/uL (ref 4.0–10.3)
lymph#: 1.6 10*3/uL (ref 0.9–3.3)

## 2010-09-29 ENCOUNTER — Other Ambulatory Visit (HOSPITAL_COMMUNITY): Payer: BC Managed Care – PPO

## 2010-09-29 ENCOUNTER — Ambulatory Visit (HOSPITAL_COMMUNITY)
Admission: RE | Admit: 2010-09-29 | Discharge: 2010-09-29 | Disposition: A | Discharge status: Home / Self Care | Payer: Medicare Other | Source: Ambulatory Visit | Attending: Internal Medicine | Admitting: Internal Medicine

## 2010-09-29 ENCOUNTER — Encounter (HOSPITAL_BASED_OUTPATIENT_CLINIC_OR_DEPARTMENT_OTHER): Payer: Medicare Other | Admitting: Internal Medicine

## 2010-09-29 DIAGNOSIS — C3410 Malignant neoplasm of upper lobe, unspecified bronchus or lung: Secondary | ICD-10-CM

## 2010-09-29 DIAGNOSIS — N400 Enlarged prostate without lower urinary tract symptoms: Secondary | ICD-10-CM | POA: Insufficient documentation

## 2010-09-29 DIAGNOSIS — C3490 Malignant neoplasm of unspecified part of unspecified bronchus or lung: Secondary | ICD-10-CM | POA: Insufficient documentation

## 2010-09-29 DIAGNOSIS — K8020 Calculus of gallbladder without cholecystitis without obstruction: Secondary | ICD-10-CM | POA: Insufficient documentation

## 2010-09-29 DIAGNOSIS — Q619 Cystic kidney disease, unspecified: Secondary | ICD-10-CM | POA: Insufficient documentation

## 2010-09-29 DIAGNOSIS — K429 Umbilical hernia without obstruction or gangrene: Secondary | ICD-10-CM | POA: Insufficient documentation

## 2010-09-29 DIAGNOSIS — I700 Atherosclerosis of aorta: Secondary | ICD-10-CM | POA: Insufficient documentation

## 2010-09-29 DIAGNOSIS — J984 Other disorders of lung: Secondary | ICD-10-CM | POA: Insufficient documentation

## 2010-09-29 DIAGNOSIS — K7689 Other specified diseases of liver: Secondary | ICD-10-CM | POA: Insufficient documentation

## 2010-09-29 MED ORDER — IOHEXOL 300 MG/ML  SOLN
100.0000 mL | Freq: Once | INTRAMUSCULAR | Status: AC | PRN
  Administered 2010-09-29: 100 mL via INTRAVENOUS

## 2010-12-28 ENCOUNTER — Encounter: Payer: Self-pay | Admitting: Internal Medicine

## 2010-12-28 ENCOUNTER — Other Ambulatory Visit: Payer: Self-pay | Admitting: Internal Medicine

## 2010-12-28 DIAGNOSIS — G629 Polyneuropathy, unspecified: Secondary | ICD-10-CM

## 2010-12-28 MED ORDER — GABAPENTIN 300 MG PO CAPS: 300.0000 mg | ORAL_CAPSULE | Freq: Three times a day (TID) | ORAL | Status: DC

## 2011-01-04 ENCOUNTER — Other Ambulatory Visit: Payer: Self-pay | Admitting: *Deleted

## 2011-01-04 DIAGNOSIS — G629 Polyneuropathy, unspecified: Secondary | ICD-10-CM

## 2011-01-04 MED ORDER — GABAPENTIN 300 MG PO CAPS: 300.0000 mg | ORAL_CAPSULE | Freq: Three times a day (TID) | ORAL | Status: DC

## 2011-01-04 NOTE — Telephone Encounter (Signed)
Pt called stating that his rx for gabapentin is usually a 3 month supply with a refill but the rx he got was only a 1 month supply with no refill.  New rx called into [facility] [phone] for new rx of gabapentin 300mg  TID with 3 month supply and 1 refill.  SLJ

## 2011-03-07 ENCOUNTER — Telehealth: Payer: Self-pay | Admitting: Internal Medicine

## 2011-03-07 NOTE — Telephone Encounter (Signed)
pt called and l/m to ck on appts,l/m on machine with appt for 3/18 and 3/20  aom

## 2011-03-18 ENCOUNTER — Other Ambulatory Visit: Payer: Self-pay | Admitting: Internal Medicine

## 2011-03-18 ENCOUNTER — Telehealth: Payer: Self-pay | Admitting: Internal Medicine

## 2011-03-18 DIAGNOSIS — C3490 Malignant neoplasm of unspecified part of unspecified bronchus or lung: Secondary | ICD-10-CM

## 2011-03-18 NOTE — Telephone Encounter (Signed)
S/w pt today re appts for lb/flush/ct 3/18 and MM 3/20. Pt lives in [place] so he will come in @ 1 pm so he can start drinking prep. Pt aware the lb/flush/ct will be 1:30 and 3 pm. Per pt nurse called him the other day and gv him appts for 3/18 and 3/20 but when he asked if he would need a scan he was told not this time. lmonvm for nurse re above asking if she gave pt this information and if so was it because he doesn't need a scan or because she did not see one scheduled. Per 09/29/10 pof pt to have scan prior to march f/u.

## 2011-04-04 ENCOUNTER — Ambulatory Visit (HOSPITAL_COMMUNITY)
Admission: RE | Admit: 2011-04-04 | Discharge: 2011-04-04 | Disposition: A | Discharge status: Home / Self Care | Payer: Medicare Other | Source: Ambulatory Visit | Attending: Internal Medicine | Admitting: Internal Medicine

## 2011-04-04 ENCOUNTER — Other Ambulatory Visit: Payer: Medicare Other | Admitting: Lab

## 2011-04-04 ENCOUNTER — Ambulatory Visit (HOSPITAL_BASED_OUTPATIENT_CLINIC_OR_DEPARTMENT_OTHER): Payer: Medicare Other

## 2011-04-04 ENCOUNTER — Other Ambulatory Visit: Payer: BC Managed Care – PPO | Admitting: Lab

## 2011-04-04 VITALS — BP 113/70 | HR 46 | Temp 97.5°F

## 2011-04-04 DIAGNOSIS — C3490 Malignant neoplasm of unspecified part of unspecified bronchus or lung: Secondary | ICD-10-CM

## 2011-04-04 DIAGNOSIS — N289 Disorder of kidney and ureter, unspecified: Secondary | ICD-10-CM | POA: Insufficient documentation

## 2011-04-04 DIAGNOSIS — R911 Solitary pulmonary nodule: Secondary | ICD-10-CM | POA: Insufficient documentation

## 2011-04-04 DIAGNOSIS — Z452 Encounter for adjustment and management of vascular access device: Secondary | ICD-10-CM

## 2011-04-04 DIAGNOSIS — R109 Unspecified abdominal pain: Secondary | ICD-10-CM | POA: Insufficient documentation

## 2011-04-04 DIAGNOSIS — C3410 Malignant neoplasm of upper lobe, unspecified bronchus or lung: Secondary | ICD-10-CM

## 2011-04-04 DIAGNOSIS — K8020 Calculus of gallbladder without cholecystitis without obstruction: Secondary | ICD-10-CM | POA: Insufficient documentation

## 2011-04-04 DIAGNOSIS — K7689 Other specified diseases of liver: Secondary | ICD-10-CM | POA: Insufficient documentation

## 2011-04-04 DIAGNOSIS — K4090 Unilateral inguinal hernia, without obstruction or gangrene, not specified as recurrent: Secondary | ICD-10-CM | POA: Insufficient documentation

## 2011-04-04 LAB — CMP (CANCER CENTER ONLY)
Albumin: 3.6 g/dL (ref 3.3–5.5)
Alkaline Phosphatase: 76 U/L (ref 26–84)
BUN, Bld: 22 mg/dL (ref 7–22)
Glucose, Bld: 90 mg/dL (ref 73–118)
Potassium: 3.6 mEq/L (ref 3.3–4.7)

## 2011-04-04 LAB — CBC WITH DIFFERENTIAL/PLATELET
Basophils Absolute: 0 10*3/uL (ref 0.0–0.1)
Eosinophils Absolute: 0.2 10*3/uL (ref 0.0–0.5)
HCT: 51.2 % — ABNORMAL HIGH (ref 38.4–49.9)
HGB: 17.3 g/dL — ABNORMAL HIGH (ref 13.0–17.1)
LYMPH%: 25.2 % (ref 14.0–49.0)
MCV: 92.8 fL (ref 79.3–98.0)
MONO%: 8.1 % (ref 0.0–14.0)
NEUT#: 4.6 10*3/uL (ref 1.5–6.5)
NEUT%: 63.9 % (ref 39.0–75.0)
Platelets: 159 10*3/uL (ref 140–400)
RDW: 14.2 % (ref 11.0–14.6)

## 2011-04-04 MED ORDER — SODIUM CHLORIDE 0.9 % IJ SOLN
10.0000 mL | INTRAMUSCULAR | Status: DC | PRN
  Administered 2011-04-04: 10 mL via INTRAVENOUS
  Filled 2011-04-04: qty 10

## 2011-04-04 MED ORDER — HEPARIN SOD (PORK) LOCK FLUSH 100 UNIT/ML IV SOLN
500.0000 [IU] | Freq: Once | INTRAVENOUS | Status: AC
  Administered 2011-04-04: 500 [IU] via INTRAVENOUS
  Filled 2011-04-04: qty 5

## 2011-04-04 MED ORDER — IOHEXOL 300 MG/ML  SOLN
100.0000 mL | Freq: Once | INTRAMUSCULAR | Status: AC | PRN
  Administered 2011-04-04: 100 mL via INTRAVENOUS

## 2011-04-06 ENCOUNTER — Telehealth: Payer: Self-pay | Admitting: Internal Medicine

## 2011-04-06 ENCOUNTER — Ambulatory Visit (HOSPITAL_BASED_OUTPATIENT_CLINIC_OR_DEPARTMENT_OTHER): Payer: Medicare Other | Admitting: Internal Medicine

## 2011-04-06 VITALS — BP 132/82 | HR 54 | Temp 97.0°F | Ht 71.0 in | Wt 245.3 lb

## 2011-04-06 DIAGNOSIS — Z9221 Personal history of antineoplastic chemotherapy: Secondary | ICD-10-CM

## 2011-04-06 DIAGNOSIS — C3490 Malignant neoplasm of unspecified part of unspecified bronchus or lung: Secondary | ICD-10-CM

## 2011-04-06 DIAGNOSIS — Z85118 Personal history of other malignant neoplasm of bronchus and lung: Secondary | ICD-10-CM

## 2011-04-06 NOTE — Telephone Encounter (Signed)
appt made and printed for pt aom °

## 2011-04-06 NOTE — Progress Notes (Signed)
[facility] [phone]   [phone]  OFFICE PROGRESS NOTE  Toniann Fail, MD, MD [phone] Laurell Josephs. [address] [place] [address]  PRINCIPAL DIAGNOSIS:  Recurrent non-small cell lung cancer initially diagnosed as stage IIIA September 2006.  PRIOR THERAPY:   1. Status post 3 cycles of neoadjuvant chemotherapy with carboplatin and docetaxel, last dose was given November 22, 2004. 2. Status post left upper lobectomy with lymph node dissection under the care of Dr. Edwyna Shell on January 11, 2005. 3. Status post pericardial window on February 02, 2005 for evacuation of postoperative pericardial tamponade and the fluid was negative for malignancy. 4. Status post 3 cycles of adjuvant chemotherapy with carboplatin and gemcitabine.  Last dose was given May 13, 2005. 5. Status post 5 cycles of systemic chemotherapy with carboplatin, paclitaxel and Avastin for disease recurrence.  Last dose was given January 04, 2006 and the patient had stable disease by the end of the last cycle. 6. Status post maintenance treatment with Avastin 15 mg/kg given every 3 weeks.  The patient is status post 42 cycles, discontinued on July 02, 2008 after the patient had stable disease for more than 2 years.  CURRENT THERAPY:  Observation.  INTERVAL HISTORY: BURL TAUZIN 66 y.o. male returns to the clinic today for routine six-month followup visit. The patient has no complaints today. He denied having any significant chest pain or shortness of breath. He has no cough or hemoptysis. He underwent hernia surgery recently. The patient has repeat CT scan of the chest, abdomen and pelvis performed recently and he is here today for evaluation and discussion of his lab results.  MEDICAL HISTORY: Past Medical History  Diagnosis Date  . lung ca dx'd 07/2004    chemo comp 06/2008  . Hypertension     ALLERGIES:   has no known allergies.  MEDICATIONS:  Current Outpatient Prescriptions    Medication Sig Dispense Refill  . aspirin 81 MG tablet Take 81 mg by mouth daily.        . chlorthalidone (HYGROTON) 25 MG tablet Take 25 mg by mouth daily.        Marland Kitchen gabapentin (NEURONTIN) 300 MG capsule Take 1 capsule (300 mg total) by mouth 3 (three) times daily.  270 capsule  1  . Multiple Vitamin (MULTIVITAMIN) tablet Take 1 tablet by mouth daily.        Marland Kitchen omeprazole (PRILOSEC) 20 MG capsule Take 20 mg by mouth daily.        . simvastatin (ZOCOR) 10 MG tablet Take 10 mg by mouth at bedtime.          REVIEW OF SYSTEMS:  A comprehensive review of systems was negative.   PHYSICAL EXAMINATION: General appearance: alert, cooperative and no distress Lymph nodes: Cervical, supraclavicular, and axillary nodes normal. Resp: clear to auscultation bilaterally Cardio: regular rate and rhythm, S1, S2 normal, no murmur, click, rub or gallop GI: soft, non-tender; bowel sounds normal; no masses,  no organomegaly Extremities: extremities normal, atraumatic, no cyanosis or edema Neurologic: Alert and oriented X 3, normal strength and tone. Normal symmetric reflexes. Normal coordination and gait  ECOG PERFORMANCE STATUS: 0 - Asymptomatic  Blood pressure 132/82, pulse 54, temperature 97 F (36.1 C), temperature source Oral, height 5\' 11"  (1.803 m), weight 245 lb 4.8 oz (111.267 kg).  LABORATORY DATA: Lab Results  Component Value Date   WBC 7.2 04/04/2011   HGB 17.3* 04/04/2011   HCT 51.2* 04/04/2011  MCV 92.8 04/04/2011   PLT 159 04/04/2011      Chemistry      Component Value Date/Time   NA 140 04/04/2011 1318   NA 137 03/26/2010 0917   NA 137 03/26/2010 0917   K 3.6 04/04/2011 1318   K 3.7 03/26/2010 0917   K 3.7 03/26/2010 0917   CL 93* 04/04/2011 1318   CL 100 03/26/2010 0917   CL 100 03/26/2010 0917   CO2 31 04/04/2011 1318   CO2 27 03/26/2010 0917   CO2 27 03/26/2010 0917   BUN 22 04/04/2011 1318   BUN 26* 03/26/2010 0917   BUN 26* 03/26/2010 0917   CREATININE 1.2 04/04/2011 1318   CREATININE 1.36  03/26/2010 0917   CREATININE 1.36 03/26/2010 0917      Component Value Date/Time   CALCIUM 8.7 04/04/2011 1318   CALCIUM 8.9 03/26/2010 0917   CALCIUM 8.9 03/26/2010 0917   ALKPHOS 76 04/04/2011 1318   ALKPHOS 105 03/26/2010 0917   ALKPHOS 105 03/26/2010 0917   AST 26 04/04/2011 1318   AST 23 03/26/2010 0917   AST 23 03/26/2010 0917   ALT 17 03/26/2010 0917   ALT 17 03/26/2010 0917   BILITOT 0.80 04/04/2011 1318   BILITOT 0.6 03/26/2010 0917   BILITOT 0.6 03/26/2010 0917       RADIOGRAPHIC STUDIES: Ct Chest W Contrast  04/04/2011  *RADIOLOGY REPORT*  Clinical Data:  Lung cancer.  Abdominal pain.  CT CHEST, ABDOMEN AND PELVIS WITH CONTRAST  Technique:  Multidetector CT imaging of the chest, abdomen and pelvis was performed following the standard protocol during bolus administration of intravenous contrast.  Contrast: OMNIPAQUE IOHEXOL 300 MG/ML IJ SOLN  Comparison:  09/29/2010 and 08/03/2006.   CT CHEST  Findings:  No pathologically enlarged mediastinal, hilar or axillary lymph nodes.  Heart size normal.  No pericardial effusion.  Emphysema.  Postoperative changes of left upper lobectomy.  A 4 mm left lower lobe nodule (image 20) is unchanged.  Subpleural scarring in the left lower lobe.  No pleural fluid.  Airway is otherwise unremarkable.  Thoracotomy changes are seen on the left.  IMPRESSION:  1.  Postoperative changes of left upper lobectomy without evidence of recurrent or metastatic disease. 2.  Left lower lobe nodule is stable.   CT ABDOMEN AND PELVIS  Findings:  Low attenuation lesions in the liver measure up to 2.1 cm in the dome, as before.  A stone is seen in the gallbladder. Adrenal glands are unremarkable.  Low attenuation lesions in the kidneys measure up to 5.9 cm off the upper pole right kidney. Intermediate density lesion in the inferior pole right kidney measures 2.1 cm and 31 HU, stable. Lesion size has actually decreased from the 08/03/2006, at which time it measured 3.4 cm, favoring a  hyperdense cyst.  Spleen, pancreas, stomach and bowel are otherwise unremarkable.  Small left inguinal hernia contains fat.  Atherosclerotic calcification of the arterial vasculature without abdominal aortic aneurysm.  No pathologically enlarged lymph nodes. No free fluid.  No worrisome lytic or sclerotic lesions.  There may be post-traumatic changes involving the right iliac wing. Degenerative changes in the spine.  IMPRESSION:  1.  No evidence of metastatic disease in the abdomen or pelvis. 2.  Cholelithiasis.  Original Report Authenticated By: Reyes Ivan, M.D.     ASSESSMENT: This is a very pleasant 66 years old white male with recurrent non-small cell lung cancer status post left upper lobectomy and several chemotherapy regimens. He  has been observation since June of 2010.  The patient has no evidence for disease progression on the recent scan.   PLAN: I discussed the scan results with the patient and recommended for him continuous observation for now with repeat CT scan of the chest, abdomen and pelvis in 6 months. He was advised to call me immediately if he has any concerning symptoms in the interval.  All questions were answered. The patient knows to call the clinic with any problems, questions or concerns. We can certainly see the patient much sooner if necessary.

## 2011-07-11 ENCOUNTER — Other Ambulatory Visit: Payer: Self-pay | Admitting: Medical Oncology

## 2011-07-11 DIAGNOSIS — G629 Polyneuropathy, unspecified: Secondary | ICD-10-CM

## 2011-07-11 MED ORDER — GABAPENTIN 300 MG PO CAPS: 300.0000 mg | ORAL_CAPSULE | Freq: Three times a day (TID) | ORAL | Status: DC

## 2011-07-11 NOTE — Telephone Encounter (Signed)
Please send to [facility]

## 2011-09-29 ENCOUNTER — Other Ambulatory Visit: Payer: Self-pay | Admitting: Oncology

## 2011-09-30 ENCOUNTER — Other Ambulatory Visit (HOSPITAL_COMMUNITY): Payer: Medicare Other

## 2011-09-30 ENCOUNTER — Ambulatory Visit (HOSPITAL_COMMUNITY)
Admission: RE | Admit: 2011-09-30 | Discharge: 2011-09-30 | Disposition: A | Discharge status: Home / Self Care | Payer: Medicare Other | Source: Ambulatory Visit | Attending: Internal Medicine | Admitting: Internal Medicine

## 2011-09-30 ENCOUNTER — Other Ambulatory Visit (HOSPITAL_BASED_OUTPATIENT_CLINIC_OR_DEPARTMENT_OTHER): Payer: Medicare Other | Admitting: Lab

## 2011-09-30 ENCOUNTER — Ambulatory Visit (HOSPITAL_BASED_OUTPATIENT_CLINIC_OR_DEPARTMENT_OTHER): Payer: Medicare Other

## 2011-09-30 VITALS — BP 144/82 | HR 46 | Temp 97.6°F

## 2011-09-30 DIAGNOSIS — R911 Solitary pulmonary nodule: Secondary | ICD-10-CM | POA: Insufficient documentation

## 2011-09-30 DIAGNOSIS — K8020 Calculus of gallbladder without cholecystitis without obstruction: Secondary | ICD-10-CM | POA: Insufficient documentation

## 2011-09-30 DIAGNOSIS — Z452 Encounter for adjustment and management of vascular access device: Secondary | ICD-10-CM

## 2011-09-30 DIAGNOSIS — I2510 Atherosclerotic heart disease of native coronary artery without angina pectoris: Secondary | ICD-10-CM | POA: Insufficient documentation

## 2011-09-30 DIAGNOSIS — K7689 Other specified diseases of liver: Secondary | ICD-10-CM | POA: Insufficient documentation

## 2011-09-30 DIAGNOSIS — C3490 Malignant neoplasm of unspecified part of unspecified bronchus or lung: Secondary | ICD-10-CM

## 2011-09-30 DIAGNOSIS — N289 Disorder of kidney and ureter, unspecified: Secondary | ICD-10-CM | POA: Insufficient documentation

## 2011-09-30 DIAGNOSIS — I7090 Unspecified atherosclerosis: Secondary | ICD-10-CM | POA: Insufficient documentation

## 2011-09-30 LAB — COMPREHENSIVE METABOLIC PANEL (CC13)
ALT: 17 U/L (ref 0–55)
AST: 20 U/L (ref 5–34)
Albumin: 3.4 g/dL — ABNORMAL LOW (ref 3.5–5.0)
BUN: 22 mg/dL (ref 7.0–26.0)
Calcium: 9 mg/dL (ref 8.4–10.4)
Chloride: 102 mEq/L (ref 98–107)
Potassium: 3.6 mEq/L (ref 3.5–5.1)

## 2011-09-30 LAB — CBC WITH DIFFERENTIAL/PLATELET
BASO%: 0.9 % (ref 0.0–2.0)
EOS%: 3.6 % (ref 0.0–7.0)
HGB: 15.1 g/dL (ref 13.0–17.1)
MCH: 34.6 pg — ABNORMAL HIGH (ref 27.2–33.4)
MCHC: 35.2 g/dL (ref 32.0–36.0)
RBC: 4.35 10*6/uL (ref 4.20–5.82)
RDW: 13.5 % (ref 11.0–14.6)
lymph#: 1.7 10*3/uL (ref 0.9–3.3)

## 2011-09-30 MED ORDER — IOHEXOL 300 MG/ML  SOLN
100.0000 mL | Freq: Once | INTRAMUSCULAR | Status: AC | PRN
  Administered 2011-09-30: 100 mL via INTRAVENOUS

## 2011-09-30 MED ORDER — HEPARIN SOD (PORK) LOCK FLUSH 100 UNIT/ML IV SOLN
500.0000 [IU] | Freq: Once | INTRAVENOUS | Status: AC
  Administered 2011-09-30: 500 [IU] via INTRAVENOUS
  Filled 2011-09-30: qty 5

## 2011-09-30 MED ORDER — SODIUM CHLORIDE 0.9 % IJ SOLN
10.0000 mL | INTRAMUSCULAR | Status: DC | PRN
  Administered 2011-09-30: 10 mL via INTRAVENOUS
  Filled 2011-09-30: qty 10

## 2011-10-06 ENCOUNTER — Ambulatory Visit (HOSPITAL_BASED_OUTPATIENT_CLINIC_OR_DEPARTMENT_OTHER): Payer: Medicare Other | Admitting: Internal Medicine

## 2011-10-06 VITALS — BP 129/73 | HR 45 | Temp 97.1°F | Resp 20 | Ht 71.0 in | Wt 247.0 lb

## 2011-10-06 DIAGNOSIS — C3432 Malignant neoplasm of lower lobe, left bronchus or lung: Secondary | ICD-10-CM | POA: Insufficient documentation

## 2011-10-06 DIAGNOSIS — C3490 Malignant neoplasm of unspecified part of unspecified bronchus or lung: Secondary | ICD-10-CM

## 2011-10-06 DIAGNOSIS — C3410 Malignant neoplasm of upper lobe, unspecified bronchus or lung: Secondary | ICD-10-CM

## 2011-10-06 NOTE — Progress Notes (Signed)
[facility] Telephone:([phone]   [phone]  OFFICE PROGRESS NOTE  Toniann Fail, MD [phone] [place] Josephs. [address] [place] [address]  PRINCIPAL DIAGNOSIS: Recurrent non-small cell lung cancer initially diagnosed as stage IIIA September 2006.   PRIOR THERAPY:  1. Status post 3 cycles of neoadjuvant chemotherapy with carboplatin and docetaxel, last dose was given November 22, 2004. 2. Status post left upper lobectomy with lymph node dissection under the care of Dr. Edwyna Shell on January 11, 2005. 3. Status post pericardial window on February 02, 2005 for evacuation of postoperative pericardial tamponade and the fluid was negative for malignancy. 4. Status post 3 cycles of adjuvant chemotherapy with carboplatin and gemcitabine. Last dose was given May 13, 2005. 5. Status post 5 cycles of systemic chemotherapy with carboplatin, paclitaxel and Avastin for disease recurrence. Last dose was given January 04, 2006 and the patient had stable disease by the end of the last cycle. 6. Status post maintenance treatment with Avastin 15 mg/kg given every 3 weeks. The patient is status post 42 cycles, discontinued on July 02, 2008 after the patient had stable disease for more than 2 years.  CURRENT THERAPY: Observation.  INTERVAL HISTORY: Wesley Harmon 66 y.o. male returns to the clinic today for routine six-month followup visit. The patient is feeling fine today with no specific complaints. He denied having any significant chest pain, shortness of breath, cough or hemoptysis. He denied having any significant weight loss or night sweats. The patient has repeat CT scan of the chest, abdomen and pelvis performed recently and he is here for evaluation and discussion of his scan results.  MEDICAL HISTORY: Past Medical History  Diagnosis Date  . lung ca dx'd 07/2004    chemo comp 06/2008  . Hypertension     ALLERGIES:   has no known allergies.  MEDICATIONS:  Current  Outpatient Prescriptions  Medication Sig Dispense Refill  . aspirin 81 MG tablet Take 81 mg by mouth daily.        . chlorthalidone (HYGROTON) 25 MG tablet Take 25 mg by mouth daily.        Marland Kitchen gabapentin (NEURONTIN) 300 MG capsule Take 1 capsule (300 mg total) by mouth 3 (three) times daily.  270 capsule  1  . Multiple Vitamin (MULTIVITAMIN) tablet Take 1 tablet by mouth daily.        Marland Kitchen omeprazole (PRILOSEC) 20 MG capsule Take 20 mg by mouth daily.        . simvastatin (ZOCOR) 10 MG tablet Take 10 mg by mouth at bedtime.         REVIEW OF SYSTEMS:  A comprehensive review of systems was negative.   PHYSICAL EXAMINATION: General appearance: alert, cooperative and no distress Head: Normocephalic, without obvious abnormality, atraumatic Lymph nodes: Cervical, supraclavicular, and axillary nodes normal. Resp: clear to auscultation bilaterally Cardio: regular rate and rhythm, S1, S2 normal, no murmur, click, rub or gallop GI: soft, non-tender; bowel sounds normal; no masses,  no organomegaly Extremities: extremities normal, atraumatic, no cyanosis or edema Neurologic: Alert and oriented X 3, normal strength and tone. Normal symmetric reflexes. Normal coordination and gait  ECOG PERFORMANCE STATUS: 0 - Asymptomatic  Blood pressure 129/73, pulse 45, temperature 97.1 F (36.2 C), resp. rate 20, height 5\' 11"  (1.803 m), weight 247 lb (112.038 kg).  LABORATORY DATA: Lab Results  Component Value Date   WBC 6.1 09/30/2011   HGB 15.1 09/30/2011   HCT 42.8 09/30/2011  MCV 98.3* 09/30/2011   PLT 171 09/30/2011      Chemistry      Component Value Date/Time   NA 139 09/30/2011 0843   NA 140 04/04/2011 1318   NA 137 03/26/2010 0917   NA 137 03/26/2010 0917   K 3.6 09/30/2011 0843   K 3.6 04/04/2011 1318   K 3.7 03/26/2010 0917   K 3.7 03/26/2010 0917   CL 102 09/30/2011 0843   CL 93* 04/04/2011 1318   CL 100 03/26/2010 0917   CL 100 03/26/2010 0917   CO2 27 09/30/2011 0843   CO2 31 04/04/2011 1318   CO2 27  03/26/2010 0917   CO2 27 03/26/2010 0917   BUN 22.0 09/30/2011 0843   BUN 22 04/04/2011 1318   BUN 26* 03/26/2010 0917   BUN 26* 03/26/2010 0917   CREATININE 1.2 09/30/2011 0843   CREATININE 1.2 04/04/2011 1318   CREATININE 1.36 03/26/2010 0917   CREATININE 1.36 03/26/2010 0917      Component Value Date/Time   CALCIUM 9.0 09/30/2011 0843   CALCIUM 8.7 04/04/2011 1318   CALCIUM 8.9 03/26/2010 0917   CALCIUM 8.9 03/26/2010 0917   ALKPHOS 100 09/30/2011 0843   ALKPHOS 76 04/04/2011 1318   ALKPHOS 105 03/26/2010 0917   ALKPHOS 105 03/26/2010 0917   AST 20 09/30/2011 0843   AST 26 04/04/2011 1318   AST 23 03/26/2010 0917   AST 23 03/26/2010 0917   ALT 17 09/30/2011 0843   ALT 17 03/26/2010 0917   ALT 17 03/26/2010 0917   BILITOT 0.80 09/30/2011 0843   BILITOT 0.80 04/04/2011 1318   BILITOT 0.6 03/26/2010 0917   BILITOT 0.6 03/26/2010 0917       RADIOGRAPHIC STUDIES: Ct Chest W Contrast  09/30/2011  *RADIOLOGY REPORT*  Clinical Data:  History of lung cancer status post left upper lobectomy.  Chemotherapy completed in 2010.  Restaging scan.  CT CHEST, ABDOMEN AND PELVIS WITH CONTRAST  Technique:  Multidetector CT imaging of the chest, abdomen and pelvis was performed following the standard protocol during bolus administration of intravenous contrast.  Contrast: OMNIPAQUE IOHEXOL 300 MG/ML  SOLN  Comparison:  CT of the chest abdomen and pelvis 04/04/2011.  CT CHEST  Findings:  Mediastinum: Heart size is normal. Small amount of pericardial calcification overlying the right ventricle anteriorly.  No gross morphologic changes to suggest constrictive physiology at this time. There is atherosclerosis of the thoracic aorta, the great vessels of the mediastinum and the coronary arteries, including calcified atherosclerotic plaque in the left anterior descending and left circumflex coronary arteries. No definite pathologically enlarged mediastinal or hilar lymph nodes are identified.  Multiple surgical clips in the left hilar region  and left side of the mediastinum.  The esophagus is unremarkable in appearance.  Left- sided subclavian single lumen Port-A-Cath with tip terminating in the distal superior vena cava.  Lungs/Pleura: Status post left upper lobectomy.  4 mm nodule in the left lower lobe (image 19 of series 4) is unchanged.  Small amount of peripheral scarring in the left lower lobe posterolaterally is unchanged.  Mild diffuse bronchial wall thickening and mild - moderate centrilobular emphysema.  Small amount of pleuroparenchymal thickening in the right apex is unchanged.  No definite new or enlarging suspicious appearing pulmonary nodules or masses are identified.  No pleural effusions.  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 There are no aggressive appearing lytic or blastic lesions noted in  the visualized portions of the skeleton.  Post thoracotomy changes in the left hemithorax redemonstrated.  IMPRESSION:  1.  Status post left upper lobectomy without evidence to suggest residual/recurrent or metastatic disease in the thorax on today's examination. 2.  4 mm left lower lobe nodule is unchanged compared to 05/22/2009 and can be considered radiographically benign. 3. Atherosclerosis, including LAD coronary artery disease. Assessment for potential risk factor modification, dietary therapy or pharmacologic therapy may be warranted, if clinically indicated. 4.  Small amount of pericardial calcification anteriorly (similar to priors) without morphologic changes to suggest constrictive physiology at this time.  CT ABDOMEN AND PELVIS  Findings:  Abdomen/Pelvis: Calcified gallstone layering dependently in the lumen of the gallbladder.  No findings to suggest acute cholecystitis at this time.  Multiple low attenuation hepatic lesions are similar to prior examinations, with the largest lesion measuring up to 2.0 cm in diameter in segment 2.  The largest lesion is  compatible with a cyst, while the smaller lesions are too small to definitively characterize (also likely small cysts).  The appearance of the pancreas, spleen and bilateral adrenal glands is unremarkable.  Multiple low attenuation renal lesions are similar to the prior examinations, including the largest lesion which extends exophytically from the anterior aspect of the upper pole measuring 6.3 cm in diameter.  The largest of these lesions are compatible with simple cysts, while the smaller lesions are too small to definitively characterize.  Atherosclerosis of the abdominal and pelvic vasculature, without definite aneurysm or dissection.  There is no ascites or pneumoperitoneum and no pathologic distension of bowel.  Normal appendix.  No definite pathologic lymphadenopathy identified within the abdomen or pelvis.  Prostate calcifications.  Urinary bladder is unremarkable in appearance.  Musculoskeletal: Mild post-traumatic deformity of the anterior aspect of the right ilium is unchanged.  No aggressive appearing lytic or blastic lesions are noted in the visualized portions of the skeleton.  IMPRESSION:  1.  No evidence to suggest metastatic disease to the abdomen or pelvis on today's examination. 2.  Cholelithiasis without findings to suggest acute cholecystitis. 3.  Multiple low attenuation renal and hepatic lesions are similar to prior examinations.  The largest of these are compatible with simple cysts, while the smaller lesions remain too small to definitively characterize (although also likely to represent small cysts). 4.  Atherosclerosis.   Original Report Authenticated By: Florencia Reasons, M.D.     ASSESSMENT: This is a very pleasant 66 years old white male with history of recurrent non-small cell lung cancer status post systemic chemotherapy was carboplatin, paclitaxel and Avastin followed by maintenance Avastin for 2 years. The patient has been observation since June of 2010 with no evidence for  disease recurrence.   PLAN: I discussed the scan results with the patient today. I recommended for him to continue on observation. I would see him back for followup visit in one year with repeat CT scan of the chest, abdomen and pelvis for restaging of his disease. He was advised to call me immediately if he has any concerning symptoms in the interval.  All questions were answered. The patient knows to call the clinic with any problems, questions or concerns. We can certainly see the patient much sooner if necessary.

## 2011-10-06 NOTE — Patient Instructions (Signed)
No evidence for disease progression on his recent scan. Followup in one year with repeat CT scan of the chest, abdomen and pelvis

## 2011-10-07 ENCOUNTER — Telehealth: Payer: Self-pay | Admitting: Internal Medicine

## 2011-10-07 NOTE — Telephone Encounter (Signed)
s.w. pt and advised on sept 2014 appts.....Marland Kitchensed

## 2011-10-10 ENCOUNTER — Telehealth: Payer: Self-pay | Admitting: Medical Oncology

## 2011-10-10 NOTE — Telephone Encounter (Signed)
Requests labs mailed to him from 09/30/11

## 2011-10-12 ENCOUNTER — Telehealth: Payer: Self-pay | Admitting: Medical Oncology

## 2011-10-12 NOTE — Telephone Encounter (Signed)
Wants copy of lab work. Pt to pick up labs-left at front for pt to pick up

## 2012-01-31 ENCOUNTER — Other Ambulatory Visit: Payer: Self-pay | Admitting: *Deleted

## 2012-01-31 DIAGNOSIS — G629 Polyneuropathy, unspecified: Secondary | ICD-10-CM

## 2012-01-31 MED ORDER — GABAPENTIN 300 MG PO CAPS: 300.0000 mg | ORAL_CAPSULE | Freq: Three times a day (TID) | ORAL | Status: DC

## 2012-07-24 ENCOUNTER — Other Ambulatory Visit: Payer: Self-pay | Admitting: Internal Medicine

## 2012-07-25 ENCOUNTER — Other Ambulatory Visit: Payer: Self-pay | Admitting: Medical Oncology

## 2012-07-25 NOTE — Telephone Encounter (Signed)
Per [facility] Neurontin shipped to pt yesterday.

## 2012-09-21 ENCOUNTER — Telehealth: Payer: Self-pay | Admitting: Medical Oncology

## 2012-09-21 NOTE — Telephone Encounter (Signed)
Returned pts call . He was not at home but his wife told me he is having increased pain under left ribcage on his side  X 2 weeks. I told wife I will try to move up all his appts but for her to call his PCP for pain management.

## 2012-09-25 ENCOUNTER — Telehealth: Payer: Self-pay | Admitting: Internal Medicine

## 2012-09-26 ENCOUNTER — Encounter (HOSPITAL_COMMUNITY): Payer: Self-pay

## 2012-09-26 ENCOUNTER — Ambulatory Visit (HOSPITAL_COMMUNITY)
Admission: RE | Admit: 2012-09-26 | Discharge: 2012-09-26 | Disposition: A | Discharge status: Home / Self Care | Payer: Medicare Other | Source: Ambulatory Visit | Attending: Internal Medicine | Admitting: Internal Medicine

## 2012-09-26 ENCOUNTER — Other Ambulatory Visit (HOSPITAL_BASED_OUTPATIENT_CLINIC_OR_DEPARTMENT_OTHER): Payer: Medicare Other | Admitting: Lab

## 2012-09-26 ENCOUNTER — Ambulatory Visit (HOSPITAL_BASED_OUTPATIENT_CLINIC_OR_DEPARTMENT_OTHER): Payer: Medicare Other

## 2012-09-26 DIAGNOSIS — N281 Cyst of kidney, acquired: Secondary | ICD-10-CM | POA: Insufficient documentation

## 2012-09-26 DIAGNOSIS — K8020 Calculus of gallbladder without cholecystitis without obstruction: Secondary | ICD-10-CM | POA: Insufficient documentation

## 2012-09-26 DIAGNOSIS — Z452 Encounter for adjustment and management of vascular access device: Secondary | ICD-10-CM

## 2012-09-26 DIAGNOSIS — N3289 Other specified disorders of bladder: Secondary | ICD-10-CM | POA: Insufficient documentation

## 2012-09-26 DIAGNOSIS — K7689 Other specified diseases of liver: Secondary | ICD-10-CM | POA: Insufficient documentation

## 2012-09-26 DIAGNOSIS — C3490 Malignant neoplasm of unspecified part of unspecified bronchus or lung: Secondary | ICD-10-CM

## 2012-09-26 DIAGNOSIS — Z85118 Personal history of other malignant neoplasm of bronchus and lung: Secondary | ICD-10-CM

## 2012-09-26 DIAGNOSIS — I2510 Atherosclerotic heart disease of native coronary artery without angina pectoris: Secondary | ICD-10-CM | POA: Insufficient documentation

## 2012-09-26 LAB — CBC WITH DIFFERENTIAL/PLATELET
Basophils Absolute: 0.1 10*3/uL (ref 0.0–0.1)
EOS%: 3.5 % (ref 0.0–7.0)
Eosinophils Absolute: 0.2 10*3/uL (ref 0.0–0.5)
HGB: 15.7 g/dL (ref 13.0–17.1)
NEUT#: 4.4 10*3/uL (ref 1.5–6.5)
RDW: 13.5 % (ref 11.0–14.6)
lymph#: 1.4 10*3/uL (ref 0.9–3.3)

## 2012-09-26 LAB — COMPREHENSIVE METABOLIC PANEL (CC13)
AST: 31 U/L (ref 5–34)
Albumin: 3.5 g/dL (ref 3.5–5.0)
BUN: 17.3 mg/dL (ref 7.0–26.0)
Calcium: 9.5 mg/dL (ref 8.4–10.4)
Chloride: 101 mEq/L (ref 98–109)
Glucose: 120 mg/dl (ref 70–140)
Potassium: 3.4 mEq/L — ABNORMAL LOW (ref 3.5–5.1)
Total Protein: 7.2 g/dL (ref 6.4–8.3)

## 2012-09-26 MED ORDER — HEPARIN SOD (PORK) LOCK FLUSH 100 UNIT/ML IV SOLN
500.0000 [IU] | Freq: Once | INTRAVENOUS | Status: AC
  Administered 2012-09-26: 500 [IU] via INTRAVENOUS
  Filled 2012-09-26: qty 5

## 2012-09-26 MED ORDER — IOHEXOL 300 MG/ML  SOLN
100.0000 mL | Freq: Once | INTRAMUSCULAR | Status: AC | PRN
  Administered 2012-09-26: 100 mL via INTRAVENOUS

## 2012-09-26 MED ORDER — SODIUM CHLORIDE 0.9 % IJ SOLN
10.0000 mL | INTRAMUSCULAR | Status: DC | PRN
  Administered 2012-09-26: 10 mL via INTRAVENOUS
  Filled 2012-09-26: qty 10

## 2012-09-26 NOTE — Patient Instructions (Addendum)

## 2012-09-27 ENCOUNTER — Telehealth: Payer: Self-pay | Admitting: Internal Medicine

## 2012-09-27 ENCOUNTER — Encounter: Payer: Self-pay | Admitting: Internal Medicine

## 2012-09-27 ENCOUNTER — Ambulatory Visit (HOSPITAL_BASED_OUTPATIENT_CLINIC_OR_DEPARTMENT_OTHER): Payer: Medicare Other | Admitting: Internal Medicine

## 2012-09-27 VITALS — BP 133/84 | HR 78 | Temp 97.4°F | Resp 20 | Ht 71.0 in | Wt 254.8 lb

## 2012-09-27 DIAGNOSIS — C3490 Malignant neoplasm of unspecified part of unspecified bronchus or lung: Secondary | ICD-10-CM

## 2012-09-27 NOTE — Progress Notes (Signed)
[facility] [phone]   [phone]  OFFICE PROGRESS NOTE  Toniann Fail, MD [phone] Laurell Josephs. [address] [place] [address]  PRINCIPAL DIAGNOSIS: Recurrent non-small cell lung cancer initially diagnosed as stage IIIA September 2006.   PRIOR THERAPY:  1. Status post 3 cycles of neoadjuvant chemotherapy with carboplatin and docetaxel, last dose was given November 22, 2004. 2. Status post left upper lobectomy with lymph node dissection under the care of Dr. Edwyna Shell on January 11, 2005. 3. Status post pericardial window on February 02, 2005 for evacuation of postoperative pericardial tamponade and the fluid was negative for malignancy. 4. Status post 3 cycles of adjuvant chemotherapy with carboplatin and gemcitabine. Last dose was given May 13, 2005. 5. Status post 5 cycles of systemic chemotherapy with carboplatin, paclitaxel and Avastin for disease recurrence. Last dose was given January 04, 2006 and the patient had stable disease by the end of the last cycle. 6. Status post maintenance treatment with Avastin 15 mg/kg given every 3 weeks. The patient is status post 42 cycles, discontinued on July 02, 2008 after the patient had stable disease for more than 2 years.  CURRENT THERAPY: Observation.  INTERVAL HISTORY: Wesley Harmon 67 y.o. male returns to the clinic today for annual followup visit accompanied by his wife with repeat CT scan for restaging of his disease. The patient has no complaints today except for pain at the left rib cage started recently. He denied having any significant shortness of breath, cough or hemoptysis. The patient denied having any significant weight loss or night sweats. He has no nausea or vomiting. He is scheduled to see a urologist in the [facility] next week for his bladder issues. The patient has repeat CT scan of the chest, abdomen and pelvis performed recently and he is here for evaluation and discussion of his scan  results.  MEDICAL HISTORY: Past Medical History  Diagnosis Date  . lung ca dx'd 07/2004    chemo comp 06/2008  . Hypertension     ALLERGIES:  has No Known Allergies.  MEDICATIONS:  Current Outpatient Prescriptions  Medication Sig Dispense Refill  . aspirin 81 MG tablet Take 81 mg by mouth daily.        . chlorthalidone (HYGROTON) 25 MG tablet Take 25 mg by mouth daily.        Marland Kitchen gabapentin (NEURONTIN) 300 MG capsule TAKE 1 CAPSULE 3 TIMES A   DAY  270 capsule  1  . Multiple Vitamin (MULTIVITAMIN) tablet Take 1 tablet by mouth daily.        Marland Kitchen omeprazole (PRILOSEC) 20 MG capsule Take 20 mg by mouth daily.        . polyethylene glycol (MIRALAX / GLYCOLAX) packet Take 17 g by mouth daily.      . simvastatin (ZOCOR) 10 MG tablet Take 10 mg by mouth at bedtime.         No current facility-administered medications for this visit.    REVIEW OF SYSTEMS:  A comprehensive review of systems was negative except for: Hematologic/lymphatic: positive for Left lower rib cage pain   PHYSICAL EXAMINATION: General appearance: alert, cooperative and no distress Head: Normocephalic, without obvious abnormality, atraumatic Neck: no adenopathy Lymph nodes: Cervical, supraclavicular, and axillary nodes normal. Resp: clear to auscultation bilaterally Cardio: regular rate and rhythm, S1, S2 normal, no murmur, click, rub or gallop GI: soft, non-tender; bowel sounds normal; no masses,  no organomegaly Extremities: extremities normal, atraumatic, no  cyanosis or edema  ECOG PERFORMANCE STATUS: 1 - Symptomatic but completely ambulatory  Blood pressure 133/84, pulse 78, temperature 97.4 F (36.3 C), temperature source Oral, resp. rate 20, height 5\' 11"  (1.803 m), weight 254 lb 12.8 oz (115.577 kg).  LABORATORY DATA: Lab Results  Component Value Date   WBC 6.7 09/26/2012   HGB 15.7 09/26/2012   HCT 44.3 09/26/2012   MCV 95.4 09/26/2012   PLT 193 09/26/2012      Chemistry      Component Value Date/Time    NA 140 09/26/2012 1143   NA 140 04/04/2011 1318   NA 137 03/26/2010 0917   K 3.4* 09/26/2012 1143   K 3.6 04/04/2011 1318   K 3.7 03/26/2010 0917   CL 102 09/30/2011 0843   CL 93* 04/04/2011 1318   CL 100 03/26/2010 0917   CO2 31* 09/26/2012 1143   CO2 31 04/04/2011 1318   CO2 27 03/26/2010 0917   BUN 17.3 09/26/2012 1143   BUN 22 04/04/2011 1318   BUN 26* 03/26/2010 0917   CREATININE 1.1 09/26/2012 1143   CREATININE 1.2 04/04/2011 1318   CREATININE 1.36 03/26/2010 0917      Component Value Date/Time   CALCIUM 9.5 09/26/2012 1143   CALCIUM 8.7 04/04/2011 1318   CALCIUM 8.9 03/26/2010 0917   ALKPHOS 91 09/26/2012 1143   ALKPHOS 76 04/04/2011 1318   ALKPHOS 105 03/26/2010 0917   AST 31 09/26/2012 1143   AST 26 04/04/2011 1318   AST 23 03/26/2010 0917   ALT 25 09/26/2012 1143   ALT 19 04/04/2011 1318   ALT 17 03/26/2010 0917   BILITOT 0.90 09/26/2012 1143   BILITOT 0.80 04/04/2011 1318   BILITOT 0.6 03/26/2010 0917       RADIOGRAPHIC STUDIES: Ct Chest W Contrast  09/26/2012   *RADIOLOGY REPORT*  Clinical Data:  Lung cancer status post left upper lobectomy, chemotherapy complete, left-sided chest pain, shortness of breath  CT CHEST, ABDOMEN AND PELVIS WITH CONTRAST  Technique:  Multidetector CT imaging of the chest, abdomen and pelvis was performed following the standard protocol during bolus administration of intravenous contrast.  Contrast: OMNIPAQUE IOHEXOL 300 MG/ML  SOLN  Comparison:  09/30/2011    CT CHEST  Findings:  Status post left upper lobectomy.  Stable 4 mm left lower lobe pulmonary nodule (series 4/image 23), unchanged, benign. Postsurgical changes along the posterior left hemithorax (series 4/image 40).  Underlying moderate centrilobular emphysematous changes.  No pleural effusion or pneumothorax.  The visualized thyroid is unremarkable.  The heart is normal in size.  No pericardial effusion.  Coronary atherosclerosis.  Atherosclerotic calcifications of the aortic arch.  Left chest port.  No  suspicious mediastinal, hilar, or axillary lymphadenopathy.  Multiple left rib thoracotomy defects.  Degenerative changes of the thoracic spine.  IMPRESSION: Status post left upper lobectomy.  No evidence of recurrent or metastatic disease in the chest.    CT ABDOMEN AND PELVIS  Findings:  2.0 cm cyst in the lateral segment left hepatic lobe (series 2/image 55).  Additional scattered small hepatic cysts.  Spleen, pancreas, and adrenal glands are within normal limits.  Layering gallstone in the gallbladder fundus (series 2/image 74). No associated inflammatory changes.  No intrahepatic or extrahepatic ductal dilatation.  5.1 x 6.2 cm anterior upper pole renal cyst (series 2/image 39). 2.0 x 1.8 cm lateral left renal cyst.  1.8 x 2.0 cm complex cystic lesion along the right lower kidney (series 2/image 93), decreased from remote prior  studies, likely reflecting a hemorrhagic cyst. Additional small bilateral probable renal cyst.  No hydronephrosis.  No evidence of bowel obstruction.  Normal appendix.  No evidence of abdominal aortic aneurysm.  No pelvic ascites.  No suspicious abdominopelvic lymphadenopathy.  Prostate is unremarkable.  Bladder is mildly thick-walled anteriorly with possible enhancement along the anterior bladder dome (coronal image 64).  Degenerative changes of the lumbar spine.  IMPRESSION: No evidence of metastatic disease in the abdomen/pelvis.  Bilateral renal cysts, including a mildly complex/hemorrhagic 2.0 cm right lower pole renal cyst.  Cholelithiasis, without associated inflammatory changes.  Mildly thick walled bladder with possible enhancement along the anterior bladder dome.  Correlate for hematuria; consider cystoscopic correlation as clinically warranted.   Original Report Authenticated By: Charline Bills, M.D.   ASSESSMENT AND PLAN: This is a very pleasant 67 years old white male with recurrent non-small cell lung cancer status post several treatment regimen including neoadjuvant  chemotherapy followed by left upper lobectomy followed by systemic chemotherapy as well as maintenance treatment with Avastin and has been observation since June of 2010 and with no evidence for disease recurrence. I discussed the scan results with the patient and his wife today. I recommended for him to continue on observation with repeat CT scan of the chest, abdomen and pelvis in one year. For the bladder wall thickening, the patient is scheduled to see a urologist in the [facility] hospital next week. He was advised to call immediately if he has any concerning symptoms in the interval.  The patient voices understanding of current disease status and treatment options and is in agreement with the current care plan.  All questions were answered. The patient knows to call the clinic with any problems, questions or concerns. We can certainly see the patient much sooner if necessary.

## 2012-09-27 NOTE — Telephone Encounter (Signed)
Gave pt appt for lab and Md  and oral contrast  for September 2015

## 2012-09-29 NOTE — Patient Instructions (Signed)
Followup visit in one year with repeat CT scan of the chest, abdomen and pelvis. 

## 2012-10-01 ENCOUNTER — Ambulatory Visit (HOSPITAL_COMMUNITY): Payer: Federal, State, Local not specified - PPO

## 2012-10-01 ENCOUNTER — Other Ambulatory Visit: Payer: Medicare Other | Admitting: Lab

## 2012-10-03 ENCOUNTER — Ambulatory Visit: Payer: Medicare Other | Admitting: Internal Medicine

## 2012-10-21 ENCOUNTER — Emergency Department (HOSPITAL_BASED_OUTPATIENT_CLINIC_OR_DEPARTMENT_OTHER)
Admission: EM | Admit: 2012-10-21 | Discharge: 2012-10-21 | Disposition: A | Discharge status: Home / Self Care | Payer: Medicare Other | Attending: Emergency Medicine | Admitting: Emergency Medicine

## 2012-10-21 ENCOUNTER — Emergency Department (HOSPITAL_BASED_OUTPATIENT_CLINIC_OR_DEPARTMENT_OTHER): Payer: Medicare Other

## 2012-10-21 ENCOUNTER — Encounter (HOSPITAL_BASED_OUTPATIENT_CLINIC_OR_DEPARTMENT_OTHER): Payer: Self-pay | Admitting: Emergency Medicine

## 2012-10-21 DIAGNOSIS — K219 Gastro-esophageal reflux disease without esophagitis: Secondary | ICD-10-CM | POA: Insufficient documentation

## 2012-10-21 DIAGNOSIS — M109 Gout, unspecified: Secondary | ICD-10-CM | POA: Insufficient documentation

## 2012-10-21 DIAGNOSIS — Z85118 Personal history of other malignant neoplasm of bronchus and lung: Secondary | ICD-10-CM | POA: Insufficient documentation

## 2012-10-21 DIAGNOSIS — Z8669 Personal history of other diseases of the nervous system and sense organs: Secondary | ICD-10-CM | POA: Insufficient documentation

## 2012-10-21 DIAGNOSIS — Z7982 Long term (current) use of aspirin: Secondary | ICD-10-CM | POA: Insufficient documentation

## 2012-10-21 DIAGNOSIS — E785 Hyperlipidemia, unspecified: Secondary | ICD-10-CM | POA: Insufficient documentation

## 2012-10-21 DIAGNOSIS — I10 Essential (primary) hypertension: Secondary | ICD-10-CM | POA: Insufficient documentation

## 2012-10-21 DIAGNOSIS — M7042 Prepatellar bursitis, left knee: Secondary | ICD-10-CM

## 2012-10-21 DIAGNOSIS — Z87891 Personal history of nicotine dependence: Secondary | ICD-10-CM | POA: Insufficient documentation

## 2012-10-21 DIAGNOSIS — Z79899 Other long term (current) drug therapy: Secondary | ICD-10-CM | POA: Insufficient documentation

## 2012-10-21 DIAGNOSIS — M7090 Unspecified soft tissue disorder related to use, overuse and pressure of unspecified site: Secondary | ICD-10-CM | POA: Insufficient documentation

## 2012-10-21 HISTORY — DX: Hyperlipidemia, unspecified: E78.5

## 2012-10-21 HISTORY — DX: Gastro-esophageal reflux disease without esophagitis: K21.9

## 2012-10-21 HISTORY — DX: Polyneuropathy, unspecified: G62.9

## 2012-10-21 MED ORDER — COLCHICINE 0.6 MG PO TABS: 0.6000 mg | ORAL_TABLET | Freq: Two times a day (BID) | ORAL | Status: DC

## 2012-10-21 MED ORDER — METHYLPREDNISOLONE 4 MG PO KIT: PACK | ORAL | Status: DC

## 2012-10-21 MED ORDER — OXYCODONE-ACETAMINOPHEN 5-325 MG PO TABS: 2.0000 | ORAL_TABLET | ORAL | Status: DC | PRN

## 2012-10-21 NOTE — ED Provider Notes (Signed)
CSN: [id number]     Arrival date & time 10/21/12  0941 History   First MD Initiated Contact with Patient 10/21/12 1002     Chief Complaint  Patient presents with  . Knee Pain    HPI  Patient has been painting on his knees several days over the last week. His left knee has become swollen and painful. Had gout in the past. No direct injury to it.  Noticed it looked red and swollen this morning. No fevers. No swelling of the leg.  Past Medical History  Diagnosis Date  . lung ca dx'd 07/2004    chemo comp 06/2008  . Hypertension   . Neuropathy   . GERD (gastroesophageal reflux disease)   . Hyperlipemia    Past Surgical History  Procedure Laterality Date  . Cardiac surgery    . Hernia repair    . Arm surgery     History reviewed. No pertinent family history. History  Substance Use Topics  . Smoking status: Former Smoker -- 3.00 packs/day for 54 years    Quit date: 09/27/2004  . Smokeless tobacco: Not on file  . Alcohol Use: Not on file    Review of Systems  Musculoskeletal: Positive for joint swelling and arthralgias.  Skin: Positive for color change. Negative for rash and wound.  Hematological: Does not bruise/bleed easily.    Allergies  Review of patient's allergies indicates no known allergies.  Home Medications   Current Outpatient Rx  Name  Route  Sig  Dispense  Refill  . aspirin 81 MG tablet   Oral   Take 81 mg by mouth daily.           . chlorthalidone (HYGROTON) 25 MG tablet   Oral   Take 25 mg by mouth daily.           . colchicine 0.6 MG tablet   Oral   Take 1 tablet (0.6 mg total) by mouth 2 (two) times daily.   6 tablet   0   . gabapentin (NEURONTIN) 300 MG capsule      TAKE 1 CAPSULE 3 TIMES A   DAY   270 capsule   1   . methylPREDNISolone (MEDROL DOSEPAK) 4 MG tablet      6 po on day 1, then decrease by 1 per day   21 tablet   0   . Multiple Vitamin (MULTIVITAMIN) tablet   Oral   Take 1 tablet by mouth daily.           Marland Kitchen  omeprazole (PRILOSEC) 20 MG capsule   Oral   Take 20 mg by mouth daily.           Marland Kitchen oxyCODONE-acetaminophen (PERCOCET/ROXICET) 5-325 MG per tablet   Oral   Take 2 tablets by mouth every 4 (four) hours as needed for pain.   6 tablet   0   . polyethylene glycol (MIRALAX / GLYCOLAX) packet   Oral   Take 17 g by mouth daily.         . simvastatin (ZOCOR) 10 MG tablet   Oral   Take 10 mg by mouth at bedtime.            BP 158/93  Pulse 65  Temp(Src) 97.9 F (36.6 C) (Oral)  Resp 16  Ht 5\' 10"  (1.778 m)  Wt 255 lb (115.667 kg)  BMI 36.59 kg/m2  SpO2 100% Physical Exam  Musculoskeletal:  Zanaflex leg. His extensor mechanism is intact. Slight prominence of  the suprapatellar bursa. I cannot blot the patellar produce a clinical effusion within the knee. Erythema overlying the soft tissues anterior to the patella. Nontender in the infrapatellar region.  No soft tissue swelling or abnormalities above or below the knee. No Baker cyst.    ED Course  Procedures (including critical care time) Labs Review Labs Reviewed - No data to display Imaging Review No results found.  MDM   1. Gout   2. Housemaid's knee, left    Slight effusion on x-ray. Not large palpable effusion clinically.  Tenderness directly over the soft tissues overlying the patella with erythema. Extensor mechanism is intact. He has had gout in the past. Always is toe there is knee. This may be a component of housemaid's knee/prepatellar bursitis and acute gouty arthritis. He states that he did really well with a treated with Medrol. We'll prescribe a Medrol dose pack. Crutches. Percocet. Colchicine. Ice packs imminently at home. Avoid ambulating until doing so with less pain and less swelling.    Roney Marion, MD 10/21/12 1044

## 2012-10-21 NOTE — ED Notes (Signed)
Pt having left sided knee pain for several days.  Pt has been painting on knees this week with knee pads.  Noted swelling, redness and warmth to left knee.  Painful to touch.   PPP.

## 2013-01-02 DIAGNOSIS — C679 Malignant neoplasm of bladder, unspecified: Secondary | ICD-10-CM

## 2013-01-02 HISTORY — DX: Malignant neoplasm of bladder, unspecified: C67.9

## 2013-08-11 ENCOUNTER — Emergency Department (HOSPITAL_BASED_OUTPATIENT_CLINIC_OR_DEPARTMENT_OTHER)
Admission: EM | Admit: 2013-08-11 | Discharge: 2013-08-12 | Disposition: A | Discharge status: Other Acute Inpatient Hospital | Payer: Medicare Other | Attending: Emergency Medicine | Admitting: Emergency Medicine

## 2013-08-11 ENCOUNTER — Emergency Department (HOSPITAL_BASED_OUTPATIENT_CLINIC_OR_DEPARTMENT_OTHER): Payer: Medicare Other

## 2013-08-11 ENCOUNTER — Encounter (HOSPITAL_BASED_OUTPATIENT_CLINIC_OR_DEPARTMENT_OTHER): Payer: Self-pay | Admitting: Emergency Medicine

## 2013-08-11 DIAGNOSIS — G608 Other hereditary and idiopathic neuropathies: Secondary | ICD-10-CM | POA: Insufficient documentation

## 2013-08-11 DIAGNOSIS — Z8551 Personal history of malignant neoplasm of bladder: Secondary | ICD-10-CM | POA: Diagnosis not present

## 2013-08-11 DIAGNOSIS — Z85118 Personal history of other malignant neoplasm of bronchus and lung: Secondary | ICD-10-CM | POA: Diagnosis not present

## 2013-08-11 DIAGNOSIS — K219 Gastro-esophageal reflux disease without esophagitis: Secondary | ICD-10-CM | POA: Insufficient documentation

## 2013-08-11 DIAGNOSIS — Z79899 Other long term (current) drug therapy: Secondary | ICD-10-CM | POA: Diagnosis not present

## 2013-08-11 DIAGNOSIS — Z87891 Personal history of nicotine dependence: Secondary | ICD-10-CM | POA: Insufficient documentation

## 2013-08-11 DIAGNOSIS — K920 Hematemesis: Secondary | ICD-10-CM | POA: Diagnosis present

## 2013-08-11 DIAGNOSIS — I10 Essential (primary) hypertension: Secondary | ICD-10-CM | POA: Diagnosis not present

## 2013-08-11 DIAGNOSIS — E785 Hyperlipidemia, unspecified: Secondary | ICD-10-CM | POA: Insufficient documentation

## 2013-08-11 DIAGNOSIS — Z7982 Long term (current) use of aspirin: Secondary | ICD-10-CM | POA: Diagnosis not present

## 2013-08-11 DIAGNOSIS — K922 Gastrointestinal hemorrhage, unspecified: Secondary | ICD-10-CM | POA: Diagnosis not present

## 2013-08-11 HISTORY — DX: Malignant neoplasm of unspecified part of unspecified bronchus or lung: C34.90

## 2013-08-11 HISTORY — DX: Malignant neoplasm of bladder, unspecified: C67.9

## 2013-08-11 LAB — CBC WITH DIFFERENTIAL/PLATELET
Basophils Absolute: 0 10*3/uL (ref 0.0–0.1)
Basophils Relative: 1 % (ref 0–1)
EOS ABS: 0.3 10*3/uL (ref 0.0–0.7)
Eosinophils Relative: 4 % (ref 0–5)
HCT: 41.6 % (ref 39.0–52.0)
Hemoglobin: 14.3 g/dL (ref 13.0–17.0)
Lymphocytes Relative: 31 % (ref 12–46)
Lymphs Abs: 2.2 10*3/uL (ref 0.7–4.0)
MCH: 32.6 pg (ref 26.0–34.0)
MCHC: 34.4 g/dL (ref 30.0–36.0)
MCV: 95 fL (ref 78.0–100.0)
Monocytes Absolute: 0.7 10*3/uL (ref 0.1–1.0)
Monocytes Relative: 10 % (ref 3–12)
NEUTROS PCT: 54 % (ref 43–77)
Neutro Abs: 3.8 10*3/uL (ref 1.7–7.7)
PLATELETS: 172 10*3/uL (ref 150–400)
RBC: 4.38 MIL/uL (ref 4.22–5.81)
RDW: 13.1 % (ref 11.5–15.5)
WBC: 7 10*3/uL (ref 4.0–10.5)

## 2013-08-11 LAB — OCCULT BLOOD X 1 CARD TO LAB, STOOL: FECAL OCCULT BLD: NEGATIVE

## 2013-08-11 LAB — PROTIME-INR
INR: 0.96 (ref 0.00–1.49)
Prothrombin Time: 12.8 seconds (ref 11.6–15.2)

## 2013-08-11 LAB — APTT: aPTT: 33 seconds (ref 24–37)

## 2013-08-11 MED ORDER — SODIUM CHLORIDE 0.9 % IV SOLN
INTRAVENOUS | Status: DC
  Administered 2013-08-11: via INTRAVENOUS

## 2013-08-11 NOTE — ED Provider Notes (Signed)
CSN: [id number]     Arrival date & time 08/11/13  2240 History  This chart was scribed for Wesley Fines, MD by Vernell Barrier, ED scribe. This patient was seen in room MH07/MH07 and the patient's care was started at 11:14 PM.    Chief Complaint  Patient presents with  . Hemoptysis   The history is provided by the patient and the spouse. No language interpreter was used.   HPI Comments: Wesley Harmon is a 68 y.o. male w/ hx of lung cancer who is currently undergoing BCG treatment for bladder cancer presents to the Emergency Department for a hematemetic episode occuring approximately 1 hour PTA. States he vomited approximately 1/2 cup of bright red blood. No abdominal pain but states it feels tight. Reports 1 episode of hematuria 3 days ago following BCG treatment; none since. Baseline SOB from partial lobectomy when he had lung cancer. Currently has a port-a-catheter in the left upper chest. Denies hematochezia, lightheadedness, dizziness, or chest pain.   Past Medical History  Diagnosis Date  . lung ca dx'd 07/2004    chemo comp 06/2008  . Hypertension   . Neuropathy   . GERD (gastroesophageal reflux disease)   . Hyperlipemia   . Lung cancer   . Bladder cancer    Past Surgical History  Procedure Laterality Date  . Cardiac surgery    . Hernia repair    . Arm surgery     History reviewed. No pertinent family history. History  Substance Use Topics  . Smoking status: Former Smoker -- 3.00 packs/day for 47 years    Quit date: 09/27/2004  . Smokeless tobacco: Not on file  . Alcohol Use: Not on file    Review of Systems  Respiratory: Negative for shortness of breath.   Cardiovascular: Negative for chest pain.  Gastrointestinal: Positive for vomiting. Negative for blood in stool.  Neurological: Negative for dizziness.   A complete 10 system review of systems was obtained and all systems are negative except as noted in the HPI and PMH.    Allergies  Review of patient's  allergies indicates no known allergies.  Home Medications   Prior to Admission medications   Medication Sig Start Date End Date Taking? Authorizing Provider  aspirin 81 MG tablet Take 81 mg by mouth daily.     Yes Historical Provider, MD  colchicine 0.6 MG tablet Take 1 tablet (0.6 mg total) by mouth 2 (two) times daily. 10/21/12  Yes Tanna Furry, MD  gabapentin (NEURONTIN) 300 MG capsule TAKE 1 CAPSULE 3 TIMES A   DAY 07/24/12  Yes Curt Bears, MD  Multiple Vitamin (MULTIVITAMIN) tablet Take 1 tablet by mouth daily.     Yes Historical Provider, MD  omeprazole (PRILOSEC) 20 MG capsule Take 20 mg by mouth daily.     Yes Historical Provider, MD  polyethylene glycol (MIRALAX / GLYCOLAX) packet Take 17 g by mouth daily.   Yes Historical Provider, MD  simvastatin (ZOCOR) 10 MG tablet Take 10 mg by mouth at bedtime.     Yes Historical Provider, MD  chlorthalidone (HYGROTON) 25 MG tablet Take 25 mg by mouth daily.      Historical Provider, MD  methylPREDNISolone (MEDROL DOSEPAK) 4 MG tablet 6 po on day 1, then decrease by 1 per day 10/21/12   Tanna Furry, MD  oxyCODONE-acetaminophen (PERCOCET/ROXICET) 5-325 MG per tablet Take 2 tablets by mouth every 4 (four) hours as needed for pain. 10/21/12   Tanna Furry, MD   Triage  vitals: BP 187/95  Pulse 62  Temp(Src) 98.2 F (36.8 C) (Oral)  Resp 24  Ht 5\' 10"  (1.778 m)  Wt 245 lb (111.131 kg)  BMI 35.15 kg/m2  SpO2 96%  Physical Exam General: Well-developed, well-nourished male in no acute distress; appearance consistent with age of record HENT: normocephalic; atraumatic; pharynx normal Eyes: pupils equal, round and reactive to light; extraocular muscles intact Neck: supple Heart: regular rate and rhythm; no murmurs Lungs: clear to auscultation bilaterally; distant sounds. Chest: Port-a-catheter in left upper chest Abdomen: soft; nondistended; Mild tenderness above the umbilicus; no masses or hepatosplenomegaly; bowel sounds present and normal.   Rectal: normal sphincter tones; stool on examining glove brown Extremities: No deformity; full range of motion; pulses normal; trace edema of lower legs. Neurologic: Awake, alert and oriented; motor function intact in all extremities and symmetric; no facial droop Skin: Warm and dry Psychiatric: Normal mood and affect   ED Course  Procedures (including critical care time) DIAGNOSTIC STUDIES: Oxygen Saturation is 96% on room air, normal by my interpretation.    COORDINATION OF CARE: At 11:19 PM: Discussed treatment plan with patient which includes CXR and blood work. Patient agrees.     MDM   Nursing notes and vitals signs, including pulse oximetry, reviewed.  Summary of this visit's results, reviewed by myself:  Labs:  Results for orders placed during the hospital encounter of 08/11/13 (from the past 24 hour(s))  OCCULT BLOOD X 1 CARD TO LAB, STOOL     Status: None   Collection Time    08/11/13 11:30 PM      Result Value Ref Range   Fecal Occult Bld NEGATIVE  NEGATIVE  CBC WITH DIFFERENTIAL     Status: None   Collection Time    08/11/13 11:40 PM      Result Value Ref Range   WBC 7.0  4.0 - 10.5 K/uL   RBC 4.38  4.22 - 5.81 MIL/uL   Hemoglobin 14.3  13.0 - 17.0 g/dL   HCT 41.6  39.0 - 52.0 %   MCV 95.0  78.0 - 100.0 fL   MCH 32.6  26.0 - 34.0 pg   MCHC 34.4  30.0 - 36.0 g/dL   RDW 13.1  11.5 - 15.5 %   Platelets 172  150 - 400 K/uL   Neutrophils Relative % 54  43 - 77 %   Neutro Abs 3.8  1.7 - 7.7 K/uL   Lymphocytes Relative 31  12 - 46 %   Lymphs Abs 2.2  0.7 - 4.0 K/uL   Monocytes Relative 10  3 - 12 %   Monocytes Absolute 0.7  0.1 - 1.0 K/uL   Eosinophils Relative 4  0 - 5 %   Eosinophils Absolute 0.3  0.0 - 0.7 K/uL   Basophils Relative 1  0 - 1 %   Basophils Absolute 0.0  0.0 - 0.1 K/uL  COMPREHENSIVE METABOLIC PANEL     Status: Abnormal   Collection Time    08/11/13 11:40 PM      Result Value Ref Range   Sodium 142  137 - 147 mEq/L   Potassium 3.8  3.7 -  5.3 mEq/L   Chloride 105  96 - 112 mEq/L   CO2 27  19 - 32 mEq/L   Glucose, Bld 103 (*) 70 - 99 mg/dL   BUN 22  6 - 23 mg/dL   Creatinine, Ser 1.30  0.50 - 1.35 mg/dL   Calcium 9.0  8.4 -  10.5 mg/dL   Total Protein 6.8  6.0 - 8.3 g/dL   Albumin 3.7  3.5 - 5.2 g/dL   AST 21  0 - 37 U/L   ALT 16  0 - 53 U/L   Alkaline Phosphatase 97  39 - 117 U/L   Total Bilirubin 0.3  0.3 - 1.2 mg/dL   GFR calc non Af Amer 54 (*) >90 mL/min   GFR calc Af Amer 63 (*) >90 mL/min   Anion gap 10  5 - 15  PROTIME-INR     Status: None   Collection Time    08/11/13 11:40 PM      Result Value Ref Range   Prothrombin Time 12.8  11.6 - 15.2 seconds   INR 0.96  0.00 - 1.49  APTT     Status: None   Collection Time    08/11/13 11:40 PM      Result Value Ref Range   aPTT 33  24 - 37 seconds    Imaging Studies: Dg Chest 2 View  08/11/2013   CLINICAL DATA:  Vomiting bright red blood. History of lung cancer. With current treatment from bladder cancer.  EXAM: CHEST  2 VIEW  COMPARISON:  CT chest 09/26/2012  FINDINGS: Left central venous catheter with tip over the low SVC region. No pneumothorax. Postoperative changes is left thoracotomy and partial left pneumonectomy. No residual or recurrent mass is identified. No focal airspace disease or consolidation in the lungs. Normal heart size and pulmonary vascularity. Postoperative changes in the left humerus. Degenerative changes in the spine.  IMPRESSION: No active cardiopulmonary disease.   Electronically Signed   By: Lucienne Capers M.D.   On: 08/11/2013 23:36   1:01 AM Patient has remained hemodynamically stable without emesis in the ED. Dr. Doyne Keel accepts the patient for transfer to [facility].  I personally performed the services described in this documentation, which was scribed in my presence. The recorded information has been reviewed and is accurate.     Karen Chafe Johanne Mcglade, MD 08/12/13 [phone]

## 2013-08-11 NOTE — ED Notes (Addendum)
Pt states that at 22:10 this evening he vomited 1/2 cup of bright red blood. Pt has history of lung cancer (not active currently) and is being treated for bladder cancer currently.

## 2013-08-12 DIAGNOSIS — Z87891 Personal history of nicotine dependence: Secondary | ICD-10-CM | POA: Diagnosis not present

## 2013-08-12 DIAGNOSIS — Z79899 Other long term (current) drug therapy: Secondary | ICD-10-CM | POA: Diagnosis not present

## 2013-08-12 DIAGNOSIS — Z8551 Personal history of malignant neoplasm of bladder: Secondary | ICD-10-CM | POA: Diagnosis not present

## 2013-08-12 DIAGNOSIS — Z7982 Long term (current) use of aspirin: Secondary | ICD-10-CM | POA: Diagnosis not present

## 2013-08-12 DIAGNOSIS — Z85118 Personal history of other malignant neoplasm of bronchus and lung: Secondary | ICD-10-CM | POA: Diagnosis not present

## 2013-08-12 DIAGNOSIS — G608 Other hereditary and idiopathic neuropathies: Secondary | ICD-10-CM | POA: Diagnosis not present

## 2013-08-12 DIAGNOSIS — I10 Essential (primary) hypertension: Secondary | ICD-10-CM | POA: Diagnosis not present

## 2013-08-12 DIAGNOSIS — K922 Gastrointestinal hemorrhage, unspecified: Secondary | ICD-10-CM | POA: Diagnosis not present

## 2013-08-12 DIAGNOSIS — K219 Gastro-esophageal reflux disease without esophagitis: Secondary | ICD-10-CM | POA: Diagnosis not present

## 2013-08-12 DIAGNOSIS — E785 Hyperlipidemia, unspecified: Secondary | ICD-10-CM | POA: Diagnosis not present

## 2013-08-12 DIAGNOSIS — K920 Hematemesis: Secondary | ICD-10-CM | POA: Diagnosis present

## 2013-08-12 LAB — COMPREHENSIVE METABOLIC PANEL
ALBUMIN: 3.7 g/dL (ref 3.5–5.2)
ALK PHOS: 97 U/L (ref 39–117)
ALT: 16 U/L (ref 0–53)
AST: 21 U/L (ref 0–37)
Anion gap: 10 (ref 5–15)
BILIRUBIN TOTAL: 0.3 mg/dL (ref 0.3–1.2)
BUN: 22 mg/dL (ref 6–23)
CHLORIDE: 105 meq/L (ref 96–112)
CO2: 27 mEq/L (ref 19–32)
Calcium: 9 mg/dL (ref 8.4–10.5)
Creatinine, Ser: 1.3 mg/dL (ref 0.50–1.35)
GFR calc non Af Amer: 54 mL/min — ABNORMAL LOW (ref >90)
GFR, EST AFRICAN AMERICAN: 63 mL/min — AB (ref >90)
GLUCOSE: 103 mg/dL — AB (ref 70–99)
POTASSIUM: 3.8 meq/L (ref 3.7–5.3)
SODIUM: 142 meq/L (ref 137–147)
TOTAL PROTEIN: 6.8 g/dL (ref 6.0–8.3)

## 2013-08-12 MED ORDER — SODIUM CHLORIDE 0.9 % IV SOLN: 80.0000 mg | Freq: Once | INTRAVENOUS | Status: DC

## 2013-08-12 MED ORDER — PANTOPRAZOLE SODIUM 40 MG IV SOLR
INTRAVENOUS | Status: AC
  Administered 2013-08-12: 80 mg
  Filled 2013-08-12: qty 80

## 2013-08-12 NOTE — ED Notes (Signed)
[facility] arrived to transport patient to [facility].

## 2013-08-13 ENCOUNTER — Telehealth: Payer: Self-pay | Admitting: Medical Oncology

## 2013-08-13 ENCOUNTER — Other Ambulatory Visit: Payer: Self-pay | Admitting: Medical Oncology

## 2013-08-13 NOTE — Telephone Encounter (Signed)
Spit up blood Sunday -3 days ago . Had GI scoping on Monday that pt stated was negative . He is following up with PCP and was told to f/u with [facility]. He wants to move up his CT scan from sept to august. Note to [place].

## 2013-08-16 ENCOUNTER — Telehealth: Payer: Self-pay | Admitting: Internal Medicine

## 2013-08-16 NOTE — Telephone Encounter (Signed)
Spk w/pt advised of r/s time for Aug, mailed updated schedule...KJ

## 2013-08-22 ENCOUNTER — Ambulatory Visit (HOSPITAL_BASED_OUTPATIENT_CLINIC_OR_DEPARTMENT_OTHER): Payer: Medicare Other

## 2013-08-22 ENCOUNTER — Other Ambulatory Visit (HOSPITAL_BASED_OUTPATIENT_CLINIC_OR_DEPARTMENT_OTHER): Payer: Medicare Other

## 2013-08-22 ENCOUNTER — Ambulatory Visit (HOSPITAL_COMMUNITY)
Admission: RE | Admit: 2013-08-22 | Discharge: 2013-08-22 | Disposition: A | Discharge status: Home / Self Care | Payer: Medicare Other | Source: Ambulatory Visit | Attending: Internal Medicine | Admitting: Internal Medicine

## 2013-08-22 ENCOUNTER — Encounter (HOSPITAL_COMMUNITY): Payer: Self-pay

## 2013-08-22 DIAGNOSIS — K7689 Other specified diseases of liver: Secondary | ICD-10-CM | POA: Diagnosis not present

## 2013-08-22 DIAGNOSIS — R911 Solitary pulmonary nodule: Secondary | ICD-10-CM | POA: Diagnosis not present

## 2013-08-22 DIAGNOSIS — I2510 Atherosclerotic heart disease of native coronary artery without angina pectoris: Secondary | ICD-10-CM | POA: Insufficient documentation

## 2013-08-22 DIAGNOSIS — N400 Enlarged prostate without lower urinary tract symptoms: Secondary | ICD-10-CM | POA: Diagnosis not present

## 2013-08-22 DIAGNOSIS — C3490 Malignant neoplasm of unspecified part of unspecified bronchus or lung: Secondary | ICD-10-CM

## 2013-08-22 DIAGNOSIS — M47817 Spondylosis without myelopathy or radiculopathy, lumbosacral region: Secondary | ICD-10-CM | POA: Insufficient documentation

## 2013-08-22 DIAGNOSIS — Z85118 Personal history of other malignant neoplasm of bronchus and lung: Secondary | ICD-10-CM | POA: Insufficient documentation

## 2013-08-22 DIAGNOSIS — M47814 Spondylosis without myelopathy or radiculopathy, thoracic region: Secondary | ICD-10-CM | POA: Diagnosis not present

## 2013-08-22 DIAGNOSIS — Z9221 Personal history of antineoplastic chemotherapy: Secondary | ICD-10-CM | POA: Diagnosis not present

## 2013-08-22 DIAGNOSIS — N281 Cyst of kidney, acquired: Secondary | ICD-10-CM | POA: Diagnosis not present

## 2013-08-22 DIAGNOSIS — J438 Other emphysema: Secondary | ICD-10-CM | POA: Insufficient documentation

## 2013-08-22 DIAGNOSIS — Z452 Encounter for adjustment and management of vascular access device: Secondary | ICD-10-CM

## 2013-08-22 DIAGNOSIS — K8020 Calculus of gallbladder without cholecystitis without obstruction: Secondary | ICD-10-CM | POA: Insufficient documentation

## 2013-08-22 DIAGNOSIS — Z902 Acquired absence of lung [part of]: Secondary | ICD-10-CM | POA: Diagnosis not present

## 2013-08-22 DIAGNOSIS — J8410 Pulmonary fibrosis, unspecified: Secondary | ICD-10-CM | POA: Insufficient documentation

## 2013-08-22 DIAGNOSIS — C679 Malignant neoplasm of bladder, unspecified: Secondary | ICD-10-CM | POA: Insufficient documentation

## 2013-08-22 DIAGNOSIS — I700 Atherosclerosis of aorta: Secondary | ICD-10-CM | POA: Insufficient documentation

## 2013-08-22 DIAGNOSIS — Z95828 Presence of other vascular implants and grafts: Secondary | ICD-10-CM

## 2013-08-22 LAB — CBC WITH DIFFERENTIAL/PLATELET
BASO%: 1 % (ref 0.0–2.0)
BASOS ABS: 0.1 10*3/uL (ref 0.0–0.1)
EOS ABS: 0.2 10*3/uL (ref 0.0–0.5)
EOS%: 3.8 % (ref 0.0–7.0)
HCT: 45.6 % (ref 38.4–49.9)
HEMOGLOBIN: 15.1 g/dL (ref 13.0–17.1)
LYMPH#: 1.6 10*3/uL (ref 0.9–3.3)
LYMPH%: 28.5 % (ref 14.0–49.0)
MCH: 31.7 pg (ref 27.2–33.4)
MCHC: 33.2 g/dL (ref 32.0–36.0)
MCV: 95.7 fL (ref 79.3–98.0)
MONO#: 0.5 10*3/uL (ref 0.1–0.9)
MONO%: 8 % (ref 0.0–14.0)
NEUT%: 58.7 % (ref 39.0–75.0)
NEUTROS ABS: 3.4 10*3/uL (ref 1.5–6.5)
Platelets: 185 10*3/uL (ref 140–400)
RBC: 4.76 10*6/uL (ref 4.20–5.82)
RDW: 13.5 % (ref 11.0–14.6)
WBC: 5.8 10*3/uL (ref 4.0–10.3)

## 2013-08-22 LAB — COMPREHENSIVE METABOLIC PANEL (CC13)
ALBUMIN: 3.6 g/dL (ref 3.5–5.0)
ALT: 17 U/L (ref 0–55)
ANION GAP: 8 meq/L (ref 3–11)
AST: 20 U/L (ref 5–34)
Alkaline Phosphatase: 75 U/L (ref 40–150)
BUN: 19.7 mg/dL (ref 7.0–26.0)
CO2: 27 meq/L (ref 22–29)
Calcium: 9.5 mg/dL (ref 8.4–10.4)
Chloride: 106 mEq/L (ref 98–109)
Creatinine: 1.2 mg/dL (ref 0.7–1.3)
GLUCOSE: 110 mg/dL (ref 70–140)
POTASSIUM: 4 meq/L (ref 3.5–5.1)
Sodium: 141 mEq/L (ref 136–145)
Total Bilirubin: 0.86 mg/dL (ref 0.20–1.20)
Total Protein: 6.7 g/dL (ref 6.4–8.3)

## 2013-08-22 MED ORDER — IOHEXOL 300 MG/ML  SOLN
50.0000 mL | Freq: Once | INTRAMUSCULAR | Status: AC | PRN
  Administered 2013-08-22: 50 mL via ORAL

## 2013-08-22 MED ORDER — HEPARIN SOD (PORK) LOCK FLUSH 100 UNIT/ML IV SOLN
500.0000 [IU] | Freq: Once | INTRAVENOUS | Status: AC
  Administered 2013-08-22: 500 [IU] via INTRAVENOUS
  Filled 2013-08-22: qty 5

## 2013-08-22 MED ORDER — SODIUM CHLORIDE 0.9 % IJ SOLN
10.0000 mL | INTRAMUSCULAR | Status: DC | PRN
  Administered 2013-08-22: 10 mL via INTRAVENOUS
  Filled 2013-08-22: qty 10

## 2013-08-22 NOTE — Patient Instructions (Signed)

## 2013-09-02 ENCOUNTER — Telehealth: Payer: Self-pay | Admitting: *Deleted

## 2013-09-02 NOTE — Telephone Encounter (Signed)
Pt's wife called wanting to know the scan results.  Per Dr Vista Mink "nothing to worry about right now, will discuss at f/u appt".  Pt's wife verbalized understanding.  SLJ

## 2013-09-09 ENCOUNTER — Ambulatory Visit (HOSPITAL_BASED_OUTPATIENT_CLINIC_OR_DEPARTMENT_OTHER): Payer: Federal, State, Local not specified - PPO | Admitting: Internal Medicine

## 2013-09-09 VITALS — BP 157/78 | HR 60 | Temp 97.4°F | Resp 19 | Ht 70.0 in | Wt 246.3 lb

## 2013-09-09 DIAGNOSIS — Z8551 Personal history of malignant neoplasm of bladder: Secondary | ICD-10-CM

## 2013-09-09 DIAGNOSIS — K920 Hematemesis: Secondary | ICD-10-CM

## 2013-09-09 DIAGNOSIS — Z85118 Personal history of other malignant neoplasm of bronchus and lung: Secondary | ICD-10-CM

## 2013-09-09 DIAGNOSIS — C3490 Malignant neoplasm of unspecified part of unspecified bronchus or lung: Secondary | ICD-10-CM

## 2013-09-09 NOTE — Progress Notes (Signed)
[place] [phone]   [phone]  OFFICE PROGRESS NOTE  [name], MD [address] [place] [address]  PRINCIPAL DIAGNOSIS:  1) Recurrent non-small cell lung cancer initially diagnosed as stage IIIA September 2006.  2) diagnosis of early stage bladder cancer in 2014: Status post resection followed by 6 months of intravesical BCG.  PRIOR THERAPY:  1. Status post 3 cycles of neoadjuvant chemotherapy with carboplatin and docetaxel, last dose was given November 22, 2004. 2. Status post left upper lobectomy with lymph node dissection under the care of Dr. Arlyce Dice on January 11, 2005. 3. Status post pericardial window on February 02, 2005 for evacuation of postoperative pericardial tamponade and the fluid was negative for malignancy. 4. Status post 3 cycles of adjuvant chemotherapy with carboplatin and gemcitabine. Last dose was given May 13, 2005. 5. Status post 5 cycles of systemic chemotherapy with carboplatin, paclitaxel and Avastin for disease recurrence. Last dose was given January 04, 2006 and the patient had stable disease by the end of the last cycle. 6. Status post maintenance treatment with Avastin 15 mg/kg given every 3 weeks. The patient is status post 42 cycles, discontinued on July 02, 2008 after the patient had stable disease for more than 2 years.  CURRENT THERAPY: Observation.  INTERVAL HISTORY: Wesley Harmon 68 y.o. male returns to the clinic today for annual followup visit accompanied by his wife. The patient has no complaints today. He requested an earlier appointment because he weeks ago he has vomiting of blood. He was seen at the emergency department and was admitted to the hospital for further evaluation. EGD as well as colonoscopy were unremarkable and the patient was discharged home. He had no further episodes of hematemesis since that time. He was worried that this could be coming from the lung origin  and requested to be seen sooner and have repeat CT scans. He is feeling much better today. He denied having any significant shortness of breath, cough or hemoptysis. The patient denied having any significant weight loss or night sweats. He has no nausea or vomiting. The patient has repeat CT scan of the chest, abdomen and pelvis performed recently and he is here for evaluation and discussion of his scan results.  MEDICAL HISTORY: Past Medical History  Diagnosis Date  . Hypertension   . Neuropathy   . GERD (gastroesophageal reflux disease)   . Hyperlipemia   . lung ca dx'd 07/2004    chemo comp 06/2008  . Lung cancer   . Bladder cancer 01/02/13    ALLERGIES:  has No Known Allergies.  MEDICATIONS:  Current Outpatient Prescriptions  Medication Sig Dispense Refill  . aspirin 81 MG tablet Take 81 mg by mouth daily.        . chlorthalidone (HYGROTON) 25 MG tablet Take 25 mg by mouth daily.        . colchicine 0.6 MG tablet Take 1 tablet (0.6 mg total) by mouth 2 (two) times daily.  6 tablet  0  . gabapentin (NEURONTIN) 300 MG capsule TAKE 1 CAPSULE 3 TIMES A   DAY  270 capsule  1  . HYDROcodone-acetaminophen (NORCO) 10-325 MG per tablet Take by mouth.      . Multiple Vitamin (MULTIVITAMIN) tablet Take 1 tablet by mouth daily.        . phentermine (ADIPEX-P) 37.5 MG tablet Take 37.5 mg by mouth.      . polyethylene glycol (MIRALAX / GLYCOLAX)  packet Take 17 g by mouth daily.      . simvastatin (ZOCOR) 10 MG tablet Take 10 mg by mouth at bedtime.        Marland Kitchen omeprazole (PRILOSEC) 20 MG capsule Take 20 mg by mouth daily.        Marland Kitchen oxyCODONE-acetaminophen (PERCOCET/ROXICET) 5-325 MG per tablet Take 2 tablets by mouth every 4 (four) hours as needed for pain.  6 tablet  0  . Polyethylene Glycol 3350 POWD Take by mouth.       No current facility-administered medications for this visit.    REVIEW OF SYSTEMS:  A comprehensive review of systems was negative except for: Hematologic/lymphatic: positive  for Left lower rib cage pain   PHYSICAL EXAMINATION: General appearance: alert, cooperative and no distress Head: Normocephalic, without obvious abnormality, atraumatic Neck: no adenopathy Lymph nodes: Cervical, supraclavicular, and axillary nodes normal. Resp: clear to auscultation bilaterally Cardio: regular rate and rhythm, S1, S2 normal, no murmur, click, rub or gallop GI: soft, non-tender; bowel sounds normal; no masses,  no organomegaly Extremities: extremities normal, atraumatic, no cyanosis or edema  ECOG PERFORMANCE STATUS: 1 - Symptomatic but completely ambulatory  Blood pressure 157/78, pulse 60, temperature 97.4 F (36.3 C), temperature source Oral, resp. rate 19, height 5\' 10"  (1.778 m), weight 246 lb 4.8 oz (111.721 kg).  LABORATORY DATA: Lab Results  Component Value Date   WBC 5.8 08/22/2013   HGB 15.1 08/22/2013   HCT 45.6 08/22/2013   MCV 95.7 08/22/2013   PLT 185 08/22/2013      Chemistry      Component Value Date/Time   NA 141 08/22/2013 0841   NA 142 08/11/2013 2340   NA 140 04/04/2011 1318   K 4.0 08/22/2013 0841   K 3.8 08/11/2013 2340   K 3.6 04/04/2011 1318   CL 105 08/11/2013 2340   CL 102 09/30/2011 0843   CL 93* 04/04/2011 1318   CO2 27 08/22/2013 0841   CO2 27 08/11/2013 2340   CO2 31 04/04/2011 1318   BUN 19.7 08/22/2013 0841   BUN 22 08/11/2013 2340   BUN 22 04/04/2011 1318   CREATININE 1.2 08/22/2013 0841   CREATININE 1.30 08/11/2013 2340   CREATININE 1.2 04/04/2011 1318      Component Value Date/Time   CALCIUM 9.5 08/22/2013 0841   CALCIUM 9.0 08/11/2013 2340   CALCIUM 8.7 04/04/2011 1318   ALKPHOS 75 08/22/2013 0841   ALKPHOS 97 08/11/2013 2340   ALKPHOS 76 04/04/2011 1318   AST 20 08/22/2013 0841   AST 21 08/11/2013 2340   AST 26 04/04/2011 1318   ALT 17 08/22/2013 0841   ALT 16 08/11/2013 2340   ALT 19 04/04/2011 1318   BILITOT 0.86 08/22/2013 0841   BILITOT 0.3 08/11/2013 2340   BILITOT 0.80 04/04/2011 1318       RADIOGRAPHIC STUDIES: Ct Abdomen Pelvis Wo  Contrast  08/22/2013   CLINICAL DATA:  Lung cancer diagnosed 2006 status post left upper lobectomy. Bladder cancer diagnosed 12/2012. Chemotherapy complete.  EXAM: CT CHEST, ABDOMEN AND PELVIS WITHOUT CONTRAST  TECHNIQUE: Multidetector CT imaging of the chest, abdomen and pelvis was performed following the standard protocol without IV contrast.  COMPARISON:  MRI abdomen dated 05/22/2013. CT abdomen pelvis dated 05/13/2013. CT chest abdomen pelvis dated 09/26/2012.  FINDINGS: CT CHEST FINDINGS  Postsurgical changes related to left upper lobectomy. Subpleural reticulation/ fibrosis along the lateral left lower lung. Overlying left thoracotomy.  Moderate centrilobular and paraseptal emphysematous changes. 6 x 5  mm left lower lobe nodule, minimally increased from 2010 of previously measured 4 mm, but likely benign given relative stability across multiple years. No pleural effusion or pneumothorax.  Visualized thyroid is unremarkable.  The heart is normal in size. No pericardial effusion. Coronary atherosclerosis. Mild atherosclerotic calcifications of the aortic arch.  Left chest port terminates at the cavoatrial junction.  No suspicious mediastinal or axillary lymphadenopathy.  Degenerative changes of the thoracic spine.  CT ABDOMEN AND PELVIS FINDINGS  Small hepatic cysts, measuring up to 2.2 cm in the left hepatic dome (series 2/image 25).  Unenhanced spleen, pancreas, and adrenal glands are within normal limits.  Layering 1.8 cm gallstone (series 2/ image 71). No associated inflammatory changes. No intrahepatic or extrahepatic ductal dilatation.  Multiple bilateral renal cysts, incompletely evaluated, including:  --11 mm hyperdense/probable hemorrhagic cyst in the lateral left upper kidney (series 2/image 73)  --1.5 x 1.7 cm lateral interpolar cyst in the left kidney (series 2/image 76)  --5.1 x 6.5 cm simple cyst in the anterior interpolar right kidney (series 2/image 79)  --15 mm hyperdense/hemorrhagic lesion in  the right lower kidney (series 2/image 93)  No renal calculi or hydronephrosis.  Atherosclerotic calcifications of the abdominal aorta and branch vessels.  No abdominopelvic ascites.  No suspicious abdominopelvic lymphadenopathy.  Mild prostatomegaly.  Bladder is notable for mild left anterior bladder wall thickening, indeterminate in the setting of known bladder cancer.  Mild degenerative changes of the lumbar spine.  IMPRESSION: Mild left anterior bladder wall thickening, indeterminate in the setting of known bladder cancer. Consider cystoscopic correlation.  Bilateral renal cysts, some of which are hyperdense/mildly complicated, incompletely evaluated without intravenous contrast but grossly unchanged.  Status post left upper lobectomy with additional postsurgical changes in the left hemithorax. 6 x 5 mm left lower lobe nodule, minimally increased from 2010, but likely benign.  No evidence of metastatic disease in the chest, abdomen, or pelvis.  Additional ancillary findings as above.   Electronically Signed   By: Julian Hy M.D.   On: 08/22/2013 12:52   Dg Chest 2 View  08/11/2013   CLINICAL DATA:  Vomiting bright red blood. History of lung cancer. With current treatment from bladder cancer.  EXAM: CHEST  2 VIEW  COMPARISON:  CT chest 09/26/2012  FINDINGS: Left central venous catheter with tip over the low SVC region. No pneumothorax. Postoperative changes is left thoracotomy and partial left pneumonectomy. No residual or recurrent mass is identified. No focal airspace disease or consolidation in the lungs. Normal heart size and pulmonary vascularity. Postoperative changes in the left humerus. Degenerative changes in the spine.  IMPRESSION: No active cardiopulmonary disease.   Electronically Signed   By: Lucienne Capers M.D.   On: 08/11/2013 23:36   Ct Chest Wo Contrast  08/22/2013   CLINICAL DATA:  Lung cancer diagnosed 2006 status post left upper lobectomy. Bladder cancer diagnosed 12/2012.  Chemotherapy complete.  EXAM: CT CHEST, ABDOMEN AND PELVIS WITHOUT CONTRAST  TECHNIQUE: Multidetector CT imaging of the chest, abdomen and pelvis was performed following the standard protocol without IV contrast.  COMPARISON:  MRI abdomen dated 05/22/2013. CT abdomen pelvis dated 05/13/2013. CT chest abdomen pelvis dated 09/26/2012.  FINDINGS: CT CHEST FINDINGS  Postsurgical changes related to left upper lobectomy. Subpleural reticulation/ fibrosis along the lateral left lower lung. Overlying left thoracotomy.  Moderate centrilobular and paraseptal emphysematous changes. 6 x 5 mm left lower lobe nodule, minimally increased from 2010 of previously measured 4 mm, but likely benign given relative stability  across multiple years. No pleural effusion or pneumothorax.  Visualized thyroid is unremarkable.  The heart is normal in size. No pericardial effusion. Coronary atherosclerosis. Mild atherosclerotic calcifications of the aortic arch.  Left chest port terminates at the cavoatrial junction.  No suspicious mediastinal or axillary lymphadenopathy.  Degenerative changes of the thoracic spine.  CT ABDOMEN AND PELVIS FINDINGS  Small hepatic cysts, measuring up to 2.2 cm in the left hepatic dome (series 2/image 25).  Unenhanced spleen, pancreas, and adrenal glands are within normal limits.  Layering 1.8 cm gallstone (series 2/ image 71). No associated inflammatory changes. No intrahepatic or extrahepatic ductal dilatation.  Multiple bilateral renal cysts, incompletely evaluated, including:  --11 mm hyperdense/probable hemorrhagic cyst in the lateral left upper kidney (series 2/image 73)  --1.5 x 1.7 cm lateral interpolar cyst in the left kidney (series 2/image 76)  --5.1 x 6.5 cm simple cyst in the anterior interpolar right kidney (series 2/image 79)  --15 mm hyperdense/hemorrhagic lesion in the right lower kidney (series 2/image 93)  No renal calculi or hydronephrosis.  Atherosclerotic calcifications of the abdominal aorta  and branch vessels.  No abdominopelvic ascites.  No suspicious abdominopelvic lymphadenopathy.  Mild prostatomegaly.  Bladder is notable for mild left anterior bladder wall thickening, indeterminate in the setting of known bladder cancer.  Mild degenerative changes of the lumbar spine.  IMPRESSION: Mild left anterior bladder wall thickening, indeterminate in the setting of known bladder cancer. Consider cystoscopic correlation.  Bilateral renal cysts, some of which are hyperdense/mildly complicated, incompletely evaluated without intravenous contrast but grossly unchanged.  Status post left upper lobectomy with additional postsurgical changes in the left hemithorax. 6 x 5 mm left lower lobe nodule, minimally increased from 2010, but likely benign.  No evidence of metastatic disease in the chest, abdomen, or pelvis.  Additional ancillary findings as above.   Electronically Signed   By: Julian Hy M.D.   On: 08/22/2013 12:52   ASSESSMENT AND PLAN: This is a very pleasant 68 years old white male with: 1)  recurrent non-small cell lung cancer status post several treatment regimen including neoadjuvant chemotherapy followed by left upper lobectomy followed by systemic chemotherapy as well as maintenance treatment with Avastin and has been observation since June of 2010 and with no evidence for disease recurrence. I discussed the scan results with the patient and his wife today. I recommended for him to continue on observation with repeat CT scan of the chest, abdomen and pelvis in one year. 2) recent diagnosis of early stage bladder cancer: Status post resection followed by 6 months of intravesical BCG.  3) one episode of hematemesis of unclear etiology but it could be related to dental or mucosal bleeding in the mouth. No further episodes and the patient is currently asymptomatic. Continue on observation.  He was advised to call immediately if he has any concerning symptoms in the interval.  The  patient voices understanding of current disease status and treatment options and is in agreement with the current care plan.  All questions were answered. The patient knows to call the clinic with any problems, questions or concerns. We can certainly see the patient much sooner if necessary.  Disclaimer: This note was dictated with voice recognition software. Similar sounding words can inadvertently be transcribed and may be missed upon review.

## 2013-09-10 ENCOUNTER — Telehealth: Payer: Self-pay | Admitting: Internal Medicine

## 2013-09-10 NOTE — Telephone Encounter (Signed)
s.w pt wife and advised on Aug 2016 appt.Marland KitchenMarland KitchenMarland KitchenMarland Kitchenpt ok and aware

## 2013-09-25 ENCOUNTER — Other Ambulatory Visit: Payer: Federal, State, Local not specified - PPO

## 2013-09-25 ENCOUNTER — Other Ambulatory Visit (HOSPITAL_COMMUNITY): Payer: Federal, State, Local not specified - PPO

## 2013-09-26 ENCOUNTER — Ambulatory Visit: Payer: Federal, State, Local not specified - PPO | Admitting: Internal Medicine

## 2014-01-22 ENCOUNTER — Telehealth: Payer: Self-pay | Admitting: *Deleted

## 2014-01-22 NOTE — Telephone Encounter (Signed)
Pt's wife called stating that [facility] is requesting medical records to be faxed to [facility] at fax # [phone].  Information given to Carolin Guernsey in medical records.

## 2014-01-23 ENCOUNTER — Telehealth: Payer: Self-pay | Admitting: Internal Medicine

## 2014-01-23 NOTE — Telephone Encounter (Signed)
Faxed pt medical records to [facility]

## 2014-01-28 ENCOUNTER — Telehealth: Payer: Self-pay | Admitting: *Deleted

## 2014-01-28 NOTE — Telephone Encounter (Signed)
Pt's wife called stating pt saw pulmonologist Dr. Gwenyth Allegra at [facility] did a bronchoscopy and "the lung is full of cancer".  He had a PET scan done at [facility] and will be getting the results tomorrow.  Asked for her to have Dr Evert Kohl office call and fax [place] results tomorrow so we can all be on the same page.  Pt's wife stated that pt needs to have surgeon remove the lung ASAP.  Informed her that once we have all the information we can help determine what steps needs to be taken next.  She verbalized understanding.

## 2014-02-11 ENCOUNTER — Telehealth: Payer: Self-pay | Admitting: Medical Oncology

## 2014-02-11 NOTE — Telephone Encounter (Signed)
Left message for HIM to send records and left message for Sydell Axon to return my call

## 2014-02-13 ENCOUNTER — Encounter: Payer: Self-pay | Admitting: *Deleted

## 2014-02-13 NOTE — [facility] Oncology Navigator Note (Unsigned)
Received call from [facility] needing records.  Records faxed to [phone] for continuity of care.

## 2014-03-14 ENCOUNTER — Encounter: Payer: Self-pay | Admitting: *Deleted

## 2014-03-14 NOTE — [facility] Oncology Navigator Note (Unsigned)
[facility] contacted me regarding obtaining medical records on Mr. Wesley Harmon for continuity of care.  I fax with confirmation fax received.

## 2014-07-04 ENCOUNTER — Telehealth: Payer: Self-pay | Admitting: Internal Medicine

## 2014-07-04 NOTE — Telephone Encounter (Signed)
s.w. pt and advised appt moved from 8.29 to 8.31 due to md on pal...pt ok and aware

## 2014-07-11 ENCOUNTER — Telehealth: Payer: Self-pay | Admitting: *Deleted

## 2014-07-11 NOTE — Telephone Encounter (Signed)
Pt and wife called to voiced concern about upcoming lab/ct scan prior to appt with MD. Pt states, I just don't want all these scans if I don't need them.  I just want to talk with Dr. Julien Nordmann about what's going on with me. I have a bladder scan at [facility] on 6/29." Discussed with pt and wife to keep scheduled lab and CT as MD will need all available information to fully evaluate pt. I will give MD pt and wife concerns, desk nurse will cal pt on Monday to advise if MD would like pt to keep all appts or not. No further concerns at this time.

## 2014-07-12 ENCOUNTER — Other Ambulatory Visit: Payer: Self-pay | Admitting: Internal Medicine

## 2014-07-17 ENCOUNTER — Telehealth: Payer: Self-pay | Admitting: Medical Oncology

## 2014-07-17 NOTE — Telephone Encounter (Signed)
I left message for pt that he needs annual scans in aug. and the bladder scan he had at [facility] is different.

## 2014-08-12 ENCOUNTER — Telehealth: Payer: Self-pay | Admitting: *Deleted

## 2014-08-12 ENCOUNTER — Other Ambulatory Visit: Payer: Self-pay | Admitting: Medical Oncology

## 2014-08-12 ENCOUNTER — Telehealth: Payer: Self-pay | Admitting: Internal Medicine

## 2014-08-12 ENCOUNTER — Telehealth: Payer: Self-pay | Admitting: Medical Oncology

## 2014-08-12 ENCOUNTER — Encounter: Payer: Self-pay | Admitting: Medical Oncology

## 2014-08-12 NOTE — Telephone Encounter (Signed)
07/16/14-ct neck to knees. Surgery scheduled at [facility] 8/15-  remove bladder and prostate and kidney( s).Gallbladdder removed 07/28/2014.Pt offered to bring in scan . He also stated okay to look at [facility] records. Note to [place].

## 2014-08-12 NOTE — Telephone Encounter (Signed)
Pt appt to be moved up and no need for scan. Pt aware.

## 2014-08-12 NOTE — Telephone Encounter (Signed)
Scan done at [facility]

## 2014-08-12 NOTE — Telephone Encounter (Signed)
Confirmed appointment change to 08/10

## 2014-08-12 NOTE — Telephone Encounter (Signed)
Patient called asking if he can change his appointment with MD Julien Nordmann, preferably before 8/15 (surgery). Patient is also due to a CT scan of his body on 8/22. Patient received CT scan from neck down in June and can send MD [facility] the results. He would like to know if he can cancel the one in August. Message sent to MD [facility] and RN Diane.

## 2014-08-12 NOTE — Telephone Encounter (Signed)
-----   Message from Curt Bears, MD sent at 08/12/2014  9:38 AM EDT ----- OK to see him sooner and no need for scan. ----- Message -----    From: Ardeen Garland, RN    Sent: 08/12/2014   9:28 AM      To: Curt Bears, MD  CT 6/29 at [facility]. ( care everywhere) . Can he cancel CT you ordered for aug?  Next appt with you is 8/31 , however ,he has major surgery coming up Aug 11.  Do you want to see him before August 11?

## 2014-08-26 ENCOUNTER — Telehealth: Payer: Self-pay | Admitting: *Deleted

## 2014-08-26 NOTE — Telephone Encounter (Signed)
TC from patient's wife stating that he will not be able to make his appt tomorrow d/t to the fact that he is on bedrest pending an echocardiogram-he is having some heart issues. Then on Monday, 09/01/14 he will be having surgery to remove his bladder. His wife will call to re-schedule his appt with Dr. Julien Nordmann after he recovers from his surgery.

## 2014-08-27 ENCOUNTER — Ambulatory Visit: Payer: Medicare Other | Admitting: Internal Medicine

## 2014-08-27 ENCOUNTER — Other Ambulatory Visit: Payer: Medicare Other

## 2014-09-08 ENCOUNTER — Other Ambulatory Visit: Payer: Medicare Other

## 2014-09-08 ENCOUNTER — Ambulatory Visit (HOSPITAL_COMMUNITY): Payer: Medicare Other

## 2014-09-15 ENCOUNTER — Ambulatory Visit: Payer: Medicare Other | Admitting: Internal Medicine

## 2014-09-17 ENCOUNTER — Ambulatory Visit: Payer: Medicare Other | Admitting: Internal Medicine

## 2014-11-27 ENCOUNTER — Telehealth: Payer: Self-pay | Admitting: *Deleted

## 2014-11-27 DIAGNOSIS — C3490 Malignant neoplasm of unspecified part of unspecified bronchus or lung: Secondary | ICD-10-CM

## 2014-11-27 NOTE — Telephone Encounter (Signed)
Pt called requesting to r/s Aug 2016 appt.  Pt was to have CT scan prior to MD appt. Pt was unable to make it due to surgery.  POF to scheduling for lab provider appt. CT scan previously ordered.

## 2014-11-28 NOTE — Telephone Encounter (Signed)
TC today from patient asking about his appointment with Dr. Julien Nordmann. Advised pt that the order has been sent to scheduling and that scheduling will call him , most likely, the beginning of the week. He voiced understanding.

## 2015-01-05 ENCOUNTER — Ambulatory Visit (HOSPITAL_BASED_OUTPATIENT_CLINIC_OR_DEPARTMENT_OTHER): Payer: Medicare Other | Admitting: Internal Medicine

## 2015-01-05 ENCOUNTER — Telehealth: Payer: Self-pay | Admitting: Internal Medicine

## 2015-01-05 ENCOUNTER — Encounter: Payer: Self-pay | Admitting: Internal Medicine

## 2015-01-05 VITALS — BP 156/73 | HR 76 | Temp 98.6°F | Resp 18 | Ht 70.0 in | Wt 227.7 lb

## 2015-01-05 DIAGNOSIS — C3432 Malignant neoplasm of lower lobe, left bronchus or lung: Secondary | ICD-10-CM

## 2015-01-05 DIAGNOSIS — C679 Malignant neoplasm of bladder, unspecified: Secondary | ICD-10-CM | POA: Insufficient documentation

## 2015-01-05 DIAGNOSIS — C678 Malignant neoplasm of overlapping sites of bladder: Secondary | ICD-10-CM | POA: Diagnosis not present

## 2015-01-05 NOTE — Progress Notes (Signed)
[name] [phone]   Fax:(336) [phone]  OFFICE PROGRESS NOTE  Wesley Peppers, MD [place] [address]  PRINCIPAL DIAGNOSIS:  1) Recurrent non-small cell lung cancer initially diagnosed as stage IIIA September 2006.  2) diagnosis of early stage bladder cancer in 2014: Status post resection followed by 6 months of intravesical BCG. 3) He has recurrent hematuria. Repeat cystoscopy and biopsy 01/16/2013: CIS, normal RPGs. CT scan abdomen: bilateral renal cysts. No adenopathy. Cystoscopy and biopsy December 2015: Positive cytologies 4) recurrent non-small cell lung cancer, adenocarcinoma involving the left lower lobe diagnosed in November 2015.  PRIOR THERAPY:  1. Status post 3 cycles of neoadjuvant chemotherapy with carboplatin and docetaxel, last dose was given November 22, 2004. 2. Status post left upper lobectomy with lymph node dissection under the care of Dr. Arlyce Harmon on January 11, 2005. 3. Status post pericardial window on February 02, 2005 for evacuation of postoperative pericardial tamponade and the fluid was negative for malignancy. 4. Status post 3 cycles of adjuvant chemotherapy with carboplatin and gemcitabine. Last dose was given May 13, 2005. 5. Status post 5 cycles of systemic chemotherapy with carboplatin, paclitaxel and Avastin for disease recurrence. Last dose was given January 04, 2006 and the patient had stable disease by the end of the last cycle. 6. Status post maintenance treatment with Avastin 15 mg/kg given every 3 weeks. The patient is status post 42 cycles, discontinued on July 02, 2008 after the patient had stable disease for more than 2 years. 7. Status post Bilateral selective ureteral cytology, Bilateral retrograde pyelography, Transurethral resection of bladder tumor, Random bladder biopsies, Prostatic urethral biopsy and Transrectal biopsy of the prostate under the care of Dr. Rutherford Harmon at [facility]. 8. Status post redo thoracotomy with left pneumonectomy under the care of Dr. Lianne Harmon at [facility] on 03/05/2014.  CURRENT THERAPY: Observation.  INTERVAL HISTORY: Wesley Harmon 69 y.o. male returns to the clinic today for annual followup visit. The patient has no complaints today. The patient was last seen in August 2015 and repeat CT scan of the chest, abdomen and pelvis at that time showed no significant evidence for disease recurrence. In November 2015 he was evaluated for surgical resection of the bladder cancer and a PET scan was performed at that time that showed hypermetabolic activity in the left lower lobe in addition to the urinary bladder. The patient underwent resection of the bladder tumor under the care of Dr. Alford Harmon at [facility] in addition to redo thoracotomy and complete left pneumonectomy under the care of Dr. Lianne Harmon at [facility]. The final pathology showed moderately differentiated adenocarcinoma with papillary features and necrosis measuring 2.5 cm in greatest dimension and the completion pneumonectomy. There was carcinoma invading the bronchial wall into the bronchial lumen and intimately wraps around major blood vessels without invading them. One of 2 lymph nodes involved by carcinoma with squamous features. In July 2016 the patient also had cholecystectomy. He is feeling much better today and came for evaluation and follow-up visit of his disease. He is feeling fine today was no specific complaints except for mild fatigue. He has shortness of breath with exertion but no significant chest pain, cough or hemoptysis. He denied having any significant weight loss or night sweats. He has no nausea or vomiting, no fever or chills. He had repeat CT scan of the chest, abdomen and pelvis in early November 2016 that showed no  evidence for disease recurrence. He is also scheduled for repeat CT scan in early June 2017.  MEDICAL  HISTORY: Past Medical History  Diagnosis Date  . Hypertension   . Neuropathy   . GERD (gastroesophageal reflux disease)   . Hyperlipemia   . lung ca dx'd 07/2004    chemo comp 06/2008  . Lung cancer   . Bladder cancer 01/02/13    ALLERGIES:  has No Known Allergies.  MEDICATIONS:  Current Outpatient Prescriptions  Medication Sig Dispense Refill  . gabapentin (NEURONTIN) 300 MG capsule TAKE 1 CAPSULE 3 TIMES A   DAY 270 capsule 1  . Multiple Vitamin (MULTIVITAMIN) tablet Take 1 tablet by mouth daily.      Marland Kitchen omeprazole (PRILOSEC) 20 MG capsule Take 20 mg by mouth daily.      Marland Kitchen oxyCODONE-acetaminophen (PERCOCET/ROXICET) 5-325 MG per tablet Take 2 tablets by mouth every 4 (four) hours as needed for pain. 6 tablet 0  . polyethylene glycol (MIRALAX / GLYCOLAX) packet Take 17 g by mouth daily.    . simvastatin (ZOCOR) 10 MG tablet Take 10 mg by mouth at bedtime.      . colchicine 0.6 MG tablet Take 1 tablet (0.6 mg total) by mouth 2 (two) times daily. (Patient not taking: Reported on 01/05/2015) 6 tablet 0  . FLOVENT HFA 220 MCG/ACT inhaler     . HYDROcodone-acetaminophen (NORCO) 10-325 MG per tablet Take by mouth. Reported on 01/05/2015    . INCRUSE ELLIPTA 62.5 MCG/INH AEPB      No current facility-administered medications for this visit.    REVIEW OF SYSTEMS:  Constitutional: positive for fatigue Eyes: negative Ears, nose, mouth, throat, and face: negative Respiratory: positive for dyspnea on exertion Cardiovascular: negative Gastrointestinal: negative Genitourinary:negative Integument/breast: negative Hematologic/lymphatic: negative Musculoskeletal:negative Neurological: negative Behavioral/Psych: negative Endocrine: negative Allergic/Immunologic: negative   PHYSICAL EXAMINATION: General appearance: alert, cooperative and no distress Head: Normocephalic, without obvious abnormality, atraumatic Neck: no adenopathy Lymph nodes: Cervical, supraclavicular, and axillary nodes  normal. Resp: Clear to auscultation on the right and absent breath sound on the left Back: symmetric, no curvature. ROM normal. No CVA tenderness. Cardio: regular rate and rhythm, S1, S2 normal, no murmur, click, rub or gallop GI: soft, non-tender; bowel sounds normal; no masses,  no organomegaly Extremities: extremities normal, atraumatic, no cyanosis or edema Neurologic: Alert and oriented X 3, normal strength and tone. Normal symmetric reflexes. Normal coordination and gait  ECOG PERFORMANCE STATUS: 1 - Symptomatic but completely ambulatory  Blood pressure 156/73, pulse 76, temperature 98.6 F (37 C), temperature source Oral, resp. rate 18, height '5\' 10"'$  (1.778 m), weight 227 lb 11.2 oz (103.284 kg), SpO2 98 %.  LABORATORY DATA: Lab Results  Component Value Date   WBC 5.8 08/22/2013   HGB 15.1 08/22/2013   HCT 45.6 08/22/2013   MCV 95.7 08/22/2013   PLT 185 08/22/2013      Chemistry      Component Value Date/Time   NA 141 08/22/2013 0841   NA 142 08/11/2013 2340   NA 140 04/04/2011 1318   K 4.0 08/22/2013 0841   K 3.8 08/11/2013 2340   K 3.6 04/04/2011 1318   CL 105 08/11/2013 2340   CL 102 09/30/2011 0843   CL 93* 04/04/2011 1318   CO2 27 08/22/2013 0841   CO2 27 08/11/2013 2340   CO2 31 04/04/2011 1318   BUN 19.7 08/22/2013 0841   BUN 22 08/11/2013 2340   BUN 22 04/04/2011 1318   CREATININE 1.2 08/22/2013  4076   CREATININE 1.30 08/11/2013 2340   CREATININE 1.2 04/04/2011 1318      Component Value Date/Time   CALCIUM 9.5 08/22/2013 0841   CALCIUM 9.0 08/11/2013 2340   CALCIUM 8.7 04/04/2011 1318   ALKPHOS 75 08/22/2013 0841   ALKPHOS 97 08/11/2013 2340   ALKPHOS 76 04/04/2011 1318   AST 20 08/22/2013 0841   AST 21 08/11/2013 2340   AST 26 04/04/2011 1318   ALT 17 08/22/2013 0841   ALT 16 08/11/2013 2340   ALT 19 04/04/2011 1318   BILITOT 0.86 08/22/2013 0841   BILITOT 0.3 08/11/2013 2340   BILITOT 0.80 04/04/2011 1318       RADIOGRAPHIC  STUDIES:  ASSESSMENT AND PLAN: This is a very pleasant 69 years old white male with: 1)  recurrent non-small cell lung cancer status post several treatment regimen including neoadjuvant chemotherapy followed by left upper lobectomy followed by systemic chemotherapy as well as maintenance treatment with Avastin and has been observation since June of 2010 and with no evidence for disease recurrence until November 2015 when he was found to have hypermetabolic activity in the left lower lobe.. The patient underwent redo thoracotomy with complete left pneumonectomy. He is feeling fine today with no specific complaints. His last CT scan of the chest, abdomen and pelvis in November 2016 showed no evidence for disease recurrence. There is no indication for adjuvant chemotherapy at this point since his surgery were done in February 2016.  2) recent diagnosis of bladder cancer: Status post resection at [facility]. I had a lengthy discussion with the patient today about his current condition and treatment options. I recommended for the patient to continue on observation for now. I would see him back for follow-up visit in 6 months for reevaluation after he has repeat CT scan of the chest, abdomen and pelvis at [facility] as previously scheduled in June 2017.  He was advised to call immediately if he has any concerning symptoms in the interval.  The patient voices understanding of current disease status and treatment options and is in agreement with the current care plan.  All questions were answered. The patient knows to call the clinic with any problems, questions or concerns. We can certainly see the patient much sooner if necessary.  Disclaimer: This note was dictated with voice recognition software. Similar sounding words can inadvertently be transcribed and may be missed upon review.

## 2015-01-05 NOTE — Telephone Encounter (Signed)
per pof to sch pt appt-gave pt copy of avs °

## 2015-01-23 ENCOUNTER — Telehealth: Payer: Self-pay | Admitting: *Deleted

## 2015-01-23 NOTE — Telephone Encounter (Signed)
Pt called requesting new order from MD to have his PAC flushed every 6weeks at [facility] [place]. Discussed with pt MD out of office will review upon his return Monday. No further concerns.

## 2015-01-26 ENCOUNTER — Other Ambulatory Visit: Payer: Self-pay | Admitting: *Deleted

## 2015-01-26 ENCOUNTER — Telehealth: Payer: Self-pay | Admitting: *Deleted

## 2015-01-26 DIAGNOSIS — C3432 Malignant neoplasm of lower lobe, left bronchus or lung: Secondary | ICD-10-CM

## 2015-01-26 MED ORDER — SODIUM CHLORIDE 0.9 % IJ SOLN: 10.0000 mL | INTRAMUSCULAR | Status: DC | PRN

## 2015-01-26 NOTE — Telephone Encounter (Signed)
Pt called requesting to have PAC flush at [facility] [place]. This office has done his PAC flushes in the past and to continue will need new order. Reviewed with MD, call placed to [facility] for fax # ,order faxed to [phone]

## 2015-01-28 ENCOUNTER — Telehealth: Payer: Self-pay | Admitting: *Deleted

## 2015-01-28 NOTE — Telephone Encounter (Signed)
Received fax back from [place] at [facility] advised pt was not a patient at their facility. Called pt who agreed to come to [facility] for PAC flush q 6-8wks. Pt requested 1 PAC Flush on or before 1/16. POF sent

## 2015-02-01 ENCOUNTER — Telehealth: Payer: Self-pay | Admitting: Internal Medicine

## 2015-02-01 NOTE — Telephone Encounter (Signed)
Called and left a message with flush appointments

## 2015-02-02 ENCOUNTER — Ambulatory Visit (HOSPITAL_BASED_OUTPATIENT_CLINIC_OR_DEPARTMENT_OTHER): Payer: Medicare Other

## 2015-02-02 DIAGNOSIS — Z452 Encounter for adjustment and management of vascular access device: Secondary | ICD-10-CM

## 2015-02-02 DIAGNOSIS — Z85118 Personal history of other malignant neoplasm of bronchus and lung: Secondary | ICD-10-CM | POA: Diagnosis not present

## 2015-02-02 DIAGNOSIS — Z95828 Presence of other vascular implants and grafts: Secondary | ICD-10-CM

## 2015-02-02 MED ORDER — SODIUM CHLORIDE 0.9 % IJ SOLN
10.0000 mL | INTRAMUSCULAR | Status: DC | PRN
  Administered 2015-02-02: 10 mL via INTRAVENOUS
  Filled 2015-02-02: qty 10

## 2015-02-02 MED ORDER — HEPARIN SOD (PORK) LOCK FLUSH 100 UNIT/ML IV SOLN
500.0000 [IU] | Freq: Once | INTRAVENOUS | Status: AC
  Administered 2015-02-02: 500 [IU] via INTRAVENOUS
  Filled 2015-02-02: qty 5

## 2015-03-16 ENCOUNTER — Ambulatory Visit (HOSPITAL_BASED_OUTPATIENT_CLINIC_OR_DEPARTMENT_OTHER): Payer: Federal, State, Local not specified - PPO

## 2015-03-16 DIAGNOSIS — Z95828 Presence of other vascular implants and grafts: Secondary | ICD-10-CM

## 2015-03-16 DIAGNOSIS — Z452 Encounter for adjustment and management of vascular access device: Secondary | ICD-10-CM

## 2015-03-16 DIAGNOSIS — Z85118 Personal history of other malignant neoplasm of bronchus and lung: Secondary | ICD-10-CM | POA: Diagnosis not present

## 2015-03-16 MED ORDER — HEPARIN SOD (PORK) LOCK FLUSH 100 UNIT/ML IV SOLN
500.0000 [IU] | Freq: Once | INTRAVENOUS | Status: AC
  Administered 2015-03-16: 500 [IU] via INTRAVENOUS
  Filled 2015-03-16: qty 5

## 2015-03-16 MED ORDER — SODIUM CHLORIDE 0.9% FLUSH
10.0000 mL | INTRAVENOUS | Status: DC | PRN
  Administered 2015-03-16: 10 mL via INTRAVENOUS
  Filled 2015-03-16: qty 10

## 2015-03-16 NOTE — Patient Instructions (Signed)

## 2015-04-27 ENCOUNTER — Ambulatory Visit (HOSPITAL_BASED_OUTPATIENT_CLINIC_OR_DEPARTMENT_OTHER): Payer: Medicare Other

## 2015-04-27 VITALS — BP 164/81 | HR 58 | Temp 97.7°F | Resp 19

## 2015-04-27 DIAGNOSIS — Z85118 Personal history of other malignant neoplasm of bronchus and lung: Secondary | ICD-10-CM | POA: Diagnosis not present

## 2015-04-27 DIAGNOSIS — Z95828 Presence of other vascular implants and grafts: Secondary | ICD-10-CM

## 2015-04-27 DIAGNOSIS — Z452 Encounter for adjustment and management of vascular access device: Secondary | ICD-10-CM | POA: Diagnosis present

## 2015-04-27 MED ORDER — SODIUM CHLORIDE 0.9% FLUSH
10.0000 mL | INTRAVENOUS | Status: DC | PRN
  Administered 2015-04-27: 10 mL via INTRAVENOUS
  Filled 2015-04-27: qty 10

## 2015-04-27 MED ORDER — HEPARIN SOD (PORK) LOCK FLUSH 100 UNIT/ML IV SOLN
500.0000 [IU] | Freq: Once | INTRAVENOUS | Status: AC
  Administered 2015-04-27: 500 [IU] via INTRAVENOUS
  Filled 2015-04-27: qty 5

## 2015-04-30 ENCOUNTER — Telehealth: Payer: Self-pay | Admitting: Internal Medicine

## 2015-04-30 NOTE — Telephone Encounter (Signed)
retuened call adn s.w. pt and cx 5.22 flush....pt ok adn aware of new d.t

## 2015-05-10 IMAGING — CT CT CHEST W/O CM
1 of 2 series · 14 of 29 positions shown, 18 images · non-contrast
Comparison: MRI abdomen dated 05/22/2013. CT abdomen pelvis dated
05/13/2013. CT chest abdomen pelvis dated 09/26/2012.

CLINICAL DATA: Lung cancer diagnosed 1995 status post left upper
lobectomy. Bladder cancer diagnosed [DATE]. Chemotherapy complete.

EXAM:
CT CHEST, ABDOMEN AND PELVIS WITHOUT CONTRAST
TECHNIQUE: Multidetector CT imaging of the chest, abdomen and pelvis was
performed following the standard protocol without IV contrast.

[Series 2: cap w/o w/o st · axial · non-contrast · 0.83mm/px · z∈[-692,-87]mm · 14 of 137 slices shown, 18 images]
[im 8/137  mediastinal]
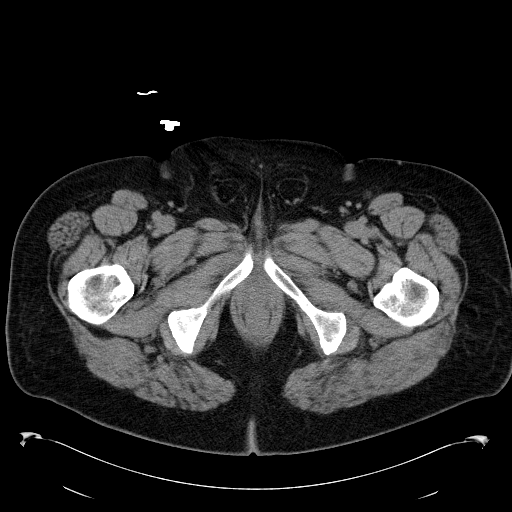
[im 8/137  lung]
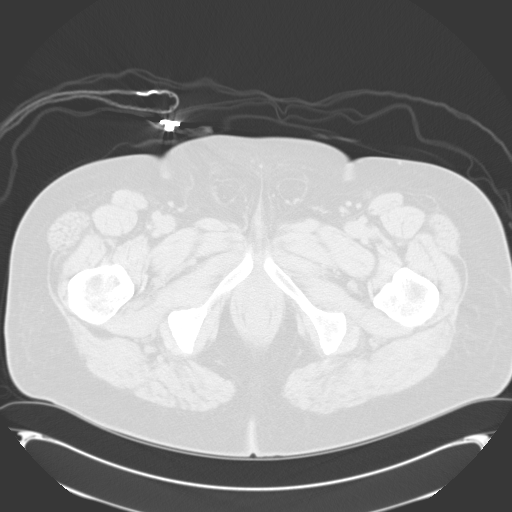
[im 16/137  lung]
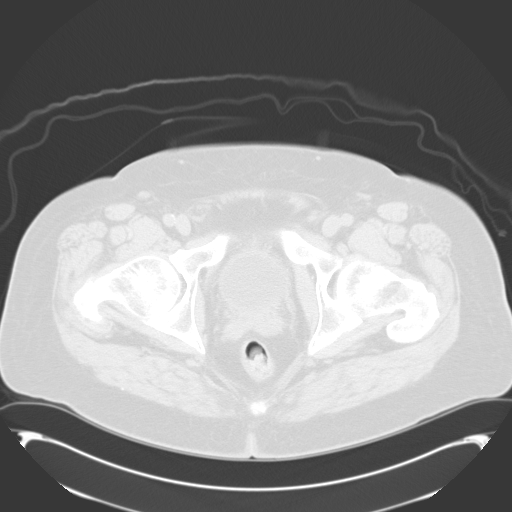
[im 31/137  lung]
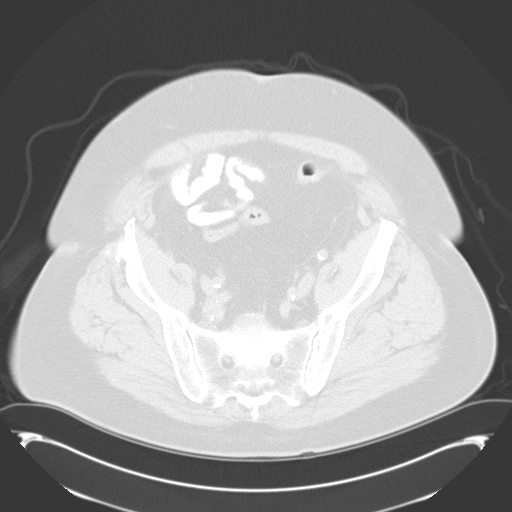
[im 38/137  lung]
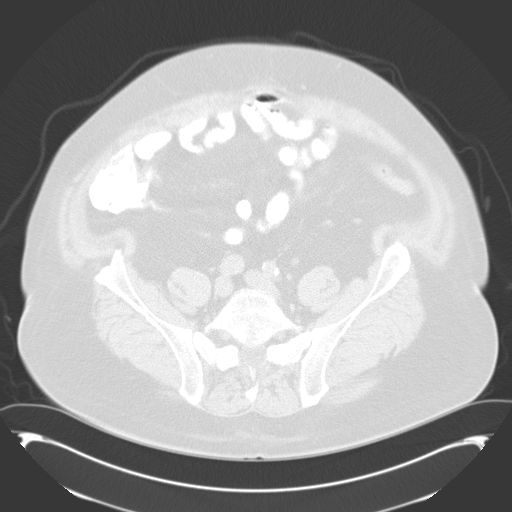
[im 46/137  mediastinal]
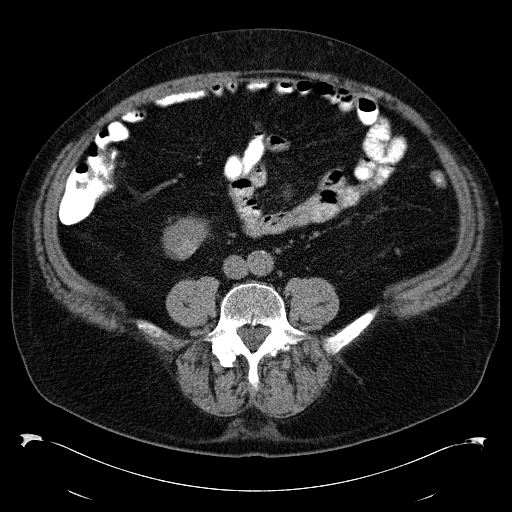
[im 46/137  lung]
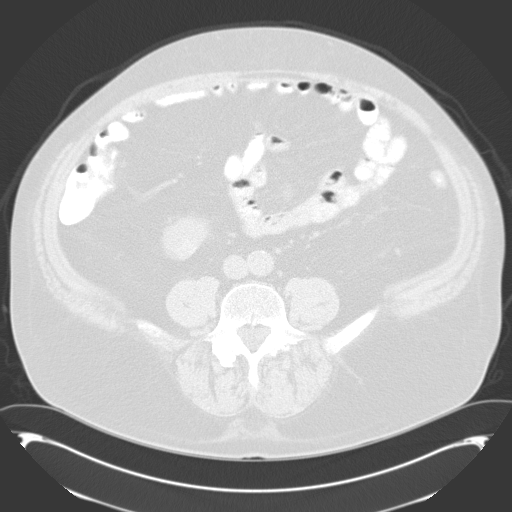
[im 61/137  lung]
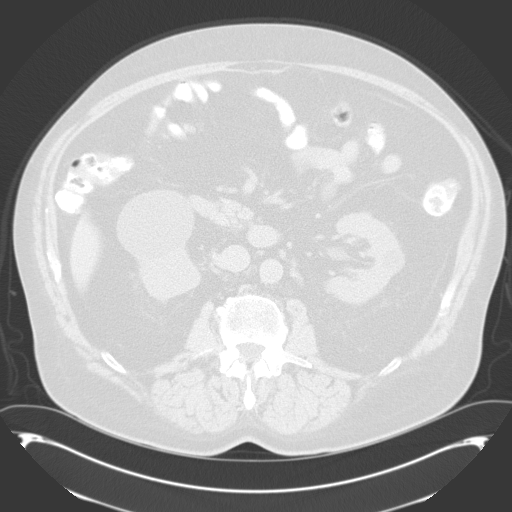
[im 67/137  lung]
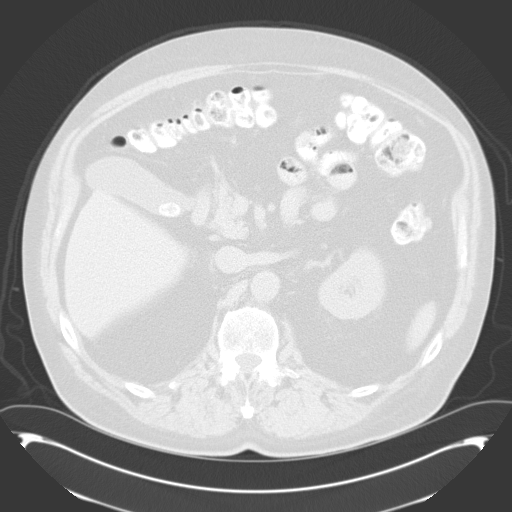
[im 69/137  lung]
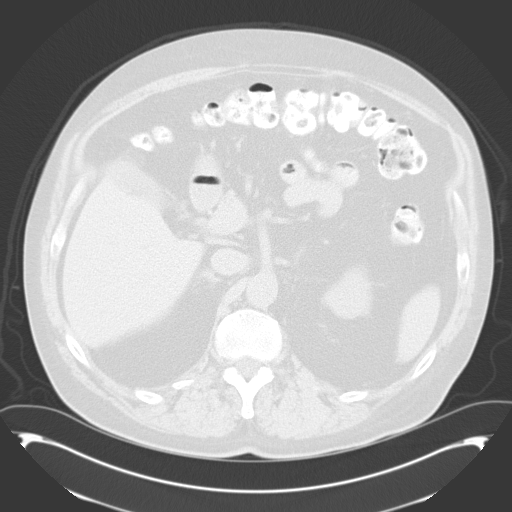
[im 76/137  mediastinal]
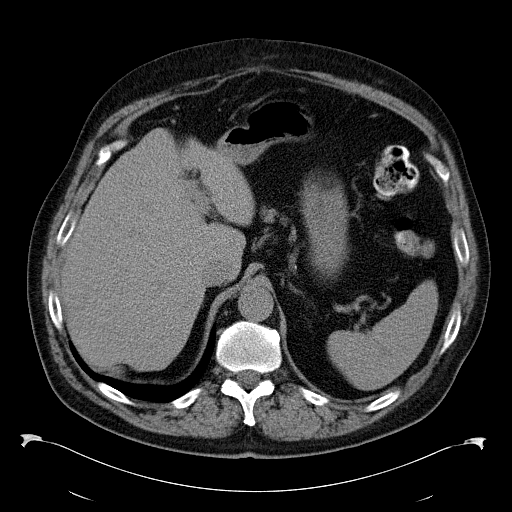
[im 76/137  lung]
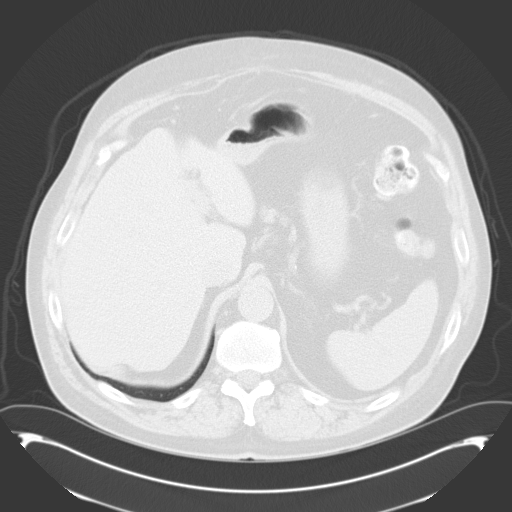
[im 91/137  lung]
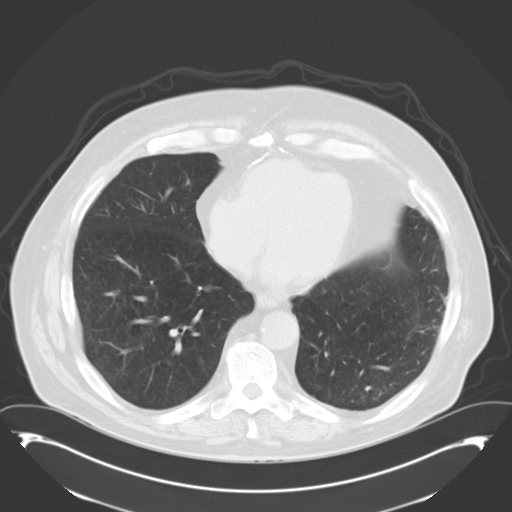
[im 99/137  lung]
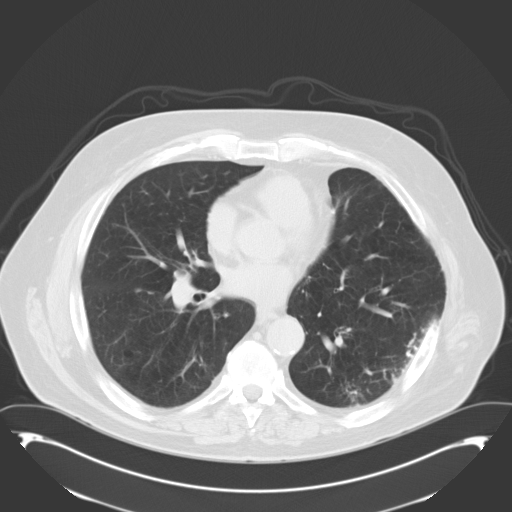
[im 106/137  lung]
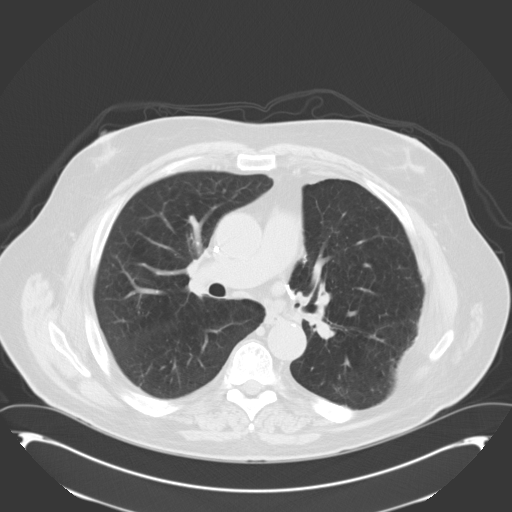
[im 121/137  mediastinal]
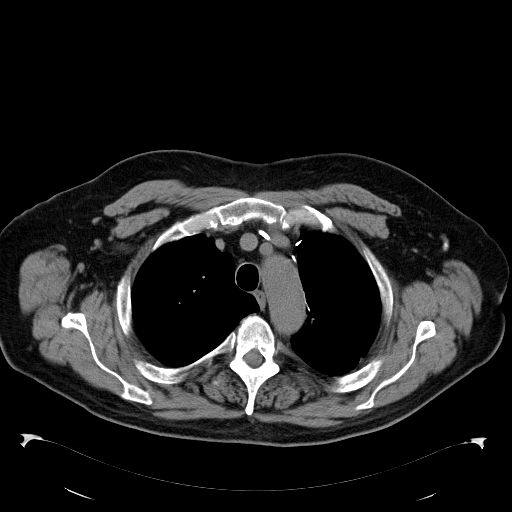
[im 121/137  lung]
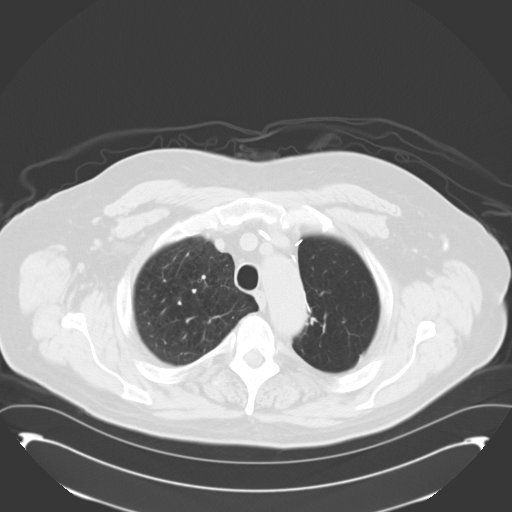
[im 129/137  lung]
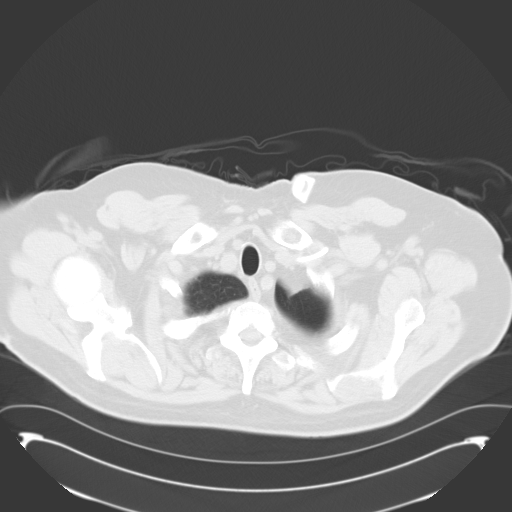

[14 of 29 positions shown; findings below may reference images not displayed]

FINDINGS: CT CHEST FINDINGS

Postsurgical changes related to left upper lobectomy. Subpleural
reticulation/ fibrosis along the lateral left lower lung. Overlying
left thoracotomy.

Moderate centrilobular and paraseptal emphysematous changes. 6 x 5
mm left lower lobe nodule, minimally increased from 2727 of
previously measured 4 mm, but likely benign given relative stability
across multiple years. No pleural effusion or pneumothorax.

Visualized thyroid is unremarkable.

The heart is normal in size. No pericardial effusion. Coronary
atherosclerosis. Mild atherosclerotic calcifications of the aortic
arch.

Left chest port terminates at the cavoatrial junction.

No suspicious mediastinal or axillary lymphadenopathy.

Degenerative changes of the thoracic spine.

CT ABDOMEN AND PELVIS FINDINGS

Small hepatic cysts, measuring up to 2.2 cm in the left hepatic dome
(series 2/image 25).

Unenhanced spleen, pancreas, and adrenal glands are within normal
limits.

Layering 1.8 cm gallstone (series 2/ image 71). No associated
inflammatory changes. No intrahepatic or extrahepatic ductal
dilatation.

Multiple bilateral renal cysts, incompletely evaluated, including:

--11 mm hyperdense/probable hemorrhagic cyst in the lateral left
upper kidney (series 2/image 73)

--1.5 x 1.7 cm lateral interpolar cyst in the left kidney (series
2/image 76)

--5.1 x 6.5 cm simple cyst in the anterior interpolar right kidney
(series 2/image 79)

--15 mm hyperdense/hemorrhagic lesion in the right lower kidney
(series 2/image 93)

No renal calculi or hydronephrosis.

Atherosclerotic calcifications of the abdominal aorta and branch
vessels.

No abdominopelvic ascites.

No suspicious abdominopelvic lymphadenopathy.

Mild prostatomegaly.

Bladder is notable for mild left anterior bladder wall thickening,
indeterminate in the setting of known bladder cancer.

Mild degenerative changes of the lumbar spine.
IMPRESSION: Mild left anterior bladder wall thickening, indeterminate in the
setting of known bladder cancer. Consider cystoscopic correlation.

Bilateral renal cysts, some of which are hyperdense/mildly
complicated, incompletely evaluated without intravenous contrast but
grossly unchanged.

Status post left upper lobectomy with additional postsurgical
changes in the left hemithorax. 6 x 5 mm left lower lobe nodule,
minimally increased from 2727, but likely benign.

No evidence of metastatic disease in the chest, abdomen, or pelvis.

Additional ancillary findings as above.

## 2015-05-27 ENCOUNTER — Telehealth: Payer: Self-pay | Admitting: Medical Oncology

## 2015-05-27 ENCOUNTER — Other Ambulatory Visit: Payer: Self-pay | Admitting: Medical Oncology

## 2015-05-27 NOTE — Telephone Encounter (Signed)
I left a message for Wesley Harmon to return my call. Dr Julien Nordmann wants to see him for his lung cancer . He wants CT C/A/P prior to visit. What scan is he having at [place] ?

## 2015-05-27 NOTE — Telephone Encounter (Signed)
Not going to [facility] until aug. Does Wesley Harmon want to wait until aug to see me instead of June as scheduled.Note to [place].

## 2015-05-28 ENCOUNTER — Telehealth: Payer: Self-pay | Admitting: Internal Medicine

## 2015-05-28 ENCOUNTER — Telehealth: Payer: Self-pay | Admitting: Medical Oncology

## 2015-05-28 NOTE — Telephone Encounter (Signed)
s.w. pt and advise don cx and appt and appt in aug

## 2015-05-28 NOTE — Telephone Encounter (Signed)
Pt asking about keeping June appt with [facility]. Per mohamed he will see pt after aug appt at [facility]. Pt notified and Onc tx request sent.

## 2015-07-08 ENCOUNTER — Ambulatory Visit (HOSPITAL_BASED_OUTPATIENT_CLINIC_OR_DEPARTMENT_OTHER): Payer: Medicare Other

## 2015-07-08 ENCOUNTER — Telehealth: Payer: Self-pay | Admitting: Internal Medicine

## 2015-07-08 ENCOUNTER — Ambulatory Visit: Payer: Medicare Other | Admitting: Internal Medicine

## 2015-07-08 ENCOUNTER — Other Ambulatory Visit: Payer: Medicare Other

## 2015-07-08 DIAGNOSIS — C3432 Malignant neoplasm of lower lobe, left bronchus or lung: Secondary | ICD-10-CM | POA: Diagnosis not present

## 2015-07-08 DIAGNOSIS — Z452 Encounter for adjustment and management of vascular access device: Secondary | ICD-10-CM

## 2015-07-08 DIAGNOSIS — Z95828 Presence of other vascular implants and grafts: Secondary | ICD-10-CM | POA: Insufficient documentation

## 2015-07-08 MED ORDER — HEPARIN SOD (PORK) LOCK FLUSH 100 UNIT/ML IV SOLN
500.0000 [IU] | Freq: Once | INTRAVENOUS | Status: AC | PRN
  Administered 2015-07-08: 500 [IU] via INTRAVENOUS
  Filled 2015-07-08: qty 5

## 2015-07-08 MED ORDER — SODIUM CHLORIDE 0.9 % IJ SOLN
10.0000 mL | INTRAMUSCULAR | Status: DC | PRN
  Administered 2015-07-08: 10 mL via INTRAVENOUS
  Filled 2015-07-08: qty 10

## 2015-07-08 NOTE — Telephone Encounter (Signed)
per pof to sch pt appt-gave pt copy of avs °

## 2015-07-08 NOTE — Patient Instructions (Signed)

## 2015-08-26 ENCOUNTER — Encounter: Payer: Self-pay | Admitting: Internal Medicine

## 2015-08-26 ENCOUNTER — Ambulatory Visit (HOSPITAL_BASED_OUTPATIENT_CLINIC_OR_DEPARTMENT_OTHER): Payer: Medicare Other | Admitting: Internal Medicine

## 2015-08-26 VITALS — BP 159/85 | HR 75 | Temp 97.7°F | Resp 17 | Ht 70.0 in | Wt 244.1 lb

## 2015-08-26 DIAGNOSIS — C3432 Malignant neoplasm of lower lobe, left bronchus or lung: Secondary | ICD-10-CM

## 2015-08-26 DIAGNOSIS — C678 Malignant neoplasm of overlapping sites of bladder: Secondary | ICD-10-CM

## 2015-08-26 DIAGNOSIS — Z8551 Personal history of malignant neoplasm of bladder: Secondary | ICD-10-CM | POA: Diagnosis present

## 2015-08-26 DIAGNOSIS — Z85118 Personal history of other malignant neoplasm of bronchus and lung: Secondary | ICD-10-CM

## 2015-08-26 NOTE — Progress Notes (Signed)
[place] Telephone:(336) [phone]   Fax:(336) [phone]  OFFICE PROGRESS NOTE  Maylon Peppers, MD [address] [place] [address]  PRINCIPAL DIAGNOSIS:  1) Recurrent non-small cell lung cancer initially diagnosed as stage IIIA September 2006.  2) diagnosis of early stage bladder cancer in 2014: Status post resection followed by 6 months of intravesical BCG. 3) He has recurrent hematuria. Repeat cystoscopy and biopsy 01/16/2013: CIS, normal RPGs. CT scan abdomen: bilateral renal cysts. No adenopathy. Cystoscopy and biopsy December 2015: Positive cytologies 4) recurrent non-small cell lung cancer, adenocarcinoma involving the left lower lobe diagnosed in November 2015.  PRIOR THERAPY:  1. Status post 3 cycles of neoadjuvant chemotherapy with carboplatin and docetaxel, last dose was given November 22, 2004. 2. Status post left upper lobectomy with lymph node dissection under the care of Dr. Arlyce Dice on January 11, 2005. 3. Status post pericardial window on February 02, 2005 for evacuation of postoperative pericardial tamponade and the fluid was negative for malignancy. 4. Status post 3 cycles of adjuvant chemotherapy with carboplatin and gemcitabine. Last dose was given May 13, 2005. 5. Status post 5 cycles of systemic chemotherapy with carboplatin, paclitaxel and Avastin for disease recurrence. Last dose was given January 04, 2006 and the patient had stable disease by the end of the last cycle. 6. Status post maintenance treatment with Avastin 15 mg/kg given every 3 weeks. The patient is status post 42 cycles, discontinued on July 02, 2008 after the patient had stable disease for more than 2 years. 7. Status post Bilateral selective ureteral cytology, Bilateral retrograde pyelography, Transurethral resection of bladder tumor, Random bladder biopsies, Prostatic urethral biopsy and Transrectal biopsy of the prostate under the care of Dr. Rutherford Limerick at [facility]. 8. Status post redo thoracotomy with left pneumonectomy under the care of Dr. Lianne Moris at [facility] on 03/05/2014.  CURRENT THERAPY: Observation.  INTERVAL HISTORY: Wesley Harmon 70 y.o. male returns to the clinic today for annual followup visit. The patient has no complaints today.  He has shortness of breath with exertion but no significant chest pain, cough or hemoptysis. He denied having any significant weight loss or night sweats. He has no nausea or vomiting, no fever or chills. He had repeat CT scan of the chest, abdomen and pelvis had [facility] on 08/20/2015 and it showed no evidence for disease recurrence. His blood work was also unremarkable. He is here today for routine evaluation.  MEDICAL HISTORY: Past Medical History:  Diagnosis Date  . Bladder cancer ([place]) 01/02/13  . GERD (gastroesophageal reflux disease)   . Hyperlipemia   . Hypertension   . lung ca dx'd 07/2004   chemo comp 06/2008  . Lung cancer ([place])   . Neuropathy ([facility])     ALLERGIES:  has No Known Allergies.  MEDICATIONS:  Current Outpatient Prescriptions  Medication Sig Dispense Refill  . colchicine 0.6 MG tablet Take 1 tablet (0.6 mg total) by mouth 2 (two) times daily. (Patient not taking: Reported on 01/05/2015) 6 tablet 0  . FLOVENT HFA 220 MCG/ACT inhaler     . gabapentin (NEURONTIN) 300 MG capsule TAKE 1 CAPSULE 3 TIMES A   DAY 270 capsule 1  . HYDROcodone-acetaminophen (NORCO) 10-325 MG per tablet Take by mouth. Reported on 01/05/2015    . INCRUSE ELLIPTA 62.5 MCG/INH AEPB     . Multiple Vitamin (MULTIVITAMIN) tablet Take 1 tablet by mouth daily.      Marland Kitchen omeprazole (PRILOSEC) 20  MG capsule Take 20 mg by mouth daily.      Marland Kitchen oxyCODONE-acetaminophen (PERCOCET/ROXICET) 5-325 MG per tablet Take 2 tablets by mouth every 4 (four) hours as needed for pain. 6 tablet 0  . polyethylene glycol (MIRALAX / GLYCOLAX) packet Take 17 g by mouth daily.    . simvastatin (ZOCOR) 10 MG tablet  Take 10 mg by mouth at bedtime.      . sodium chloride 0.9 % injection Inject 10 mLs into the vein as needed [facility] a Cath flush every 6-8 weeks until port has been removed.). 10 mL 0   No current facility-administered medications for this visit.     REVIEW OF SYSTEMS:  A comprehensive review of systems was negative except for: Respiratory: positive for dyspnea on exertion   PHYSICAL EXAMINATION: General appearance: alert, cooperative and no distress Head: Normocephalic, without obvious abnormality, atraumatic Neck: no adenopathy Lymph nodes: Cervical, supraclavicular, and axillary nodes normal. Resp: Clear to auscultation on the right and absent breath sound on the left Back: symmetric, no curvature. ROM normal. No CVA tenderness. Cardio: regular rate and rhythm, S1, S2 normal, no murmur, click, rub or gallop GI: soft, non-tender; bowel sounds normal; no masses,  no organomegaly Extremities: extremities normal, atraumatic, no cyanosis or edema Neurologic: Alert and oriented X 3, normal strength and tone. Normal symmetric reflexes. Normal coordination and gait  ECOG PERFORMANCE STATUS: 1 - Symptomatic but completely ambulatory  Blood pressure (!) 159/85, pulse 75, temperature 97.7 F (36.5 C), temperature source Oral, resp. rate 17, height '5\' 10"'$  (1.778 m), weight 244 lb 1.6 oz (110.7 kg), SpO2 96 %.  LABORATORY DATA: Lab Results  Component Value Date   WBC 5.8 08/22/2013   HGB 15.1 08/22/2013   HCT 45.6 08/22/2013   MCV 95.7 08/22/2013   PLT 185 08/22/2013      Chemistry      Component Value Date/Time   NA 141 08/22/2013 0841   K 4.0 08/22/2013 0841   CL 105 08/11/2013 2340   CL 102 09/30/2011 0843   CO2 27 08/22/2013 0841   BUN 19.7 08/22/2013 0841   CREATININE 1.2 08/22/2013 0841      Component Value Date/Time   CALCIUM 9.5 08/22/2013 0841   ALKPHOS 75 08/22/2013 0841   AST 20 08/22/2013 0841   ALT 17 08/22/2013 0841   BILITOT 0.86 08/22/2013 0841        RADIOGRAPHIC STUDIES: RADIOLOGY: CT abdomen/pelvis: 08/20/2015: Impression: 1. Status post cystoprostatectomy without evidence of recurrent or metastatic disease. 2. Status post left pneumonectomy with decreased fluid in the pneumonectomy Space.    ASSESSMENT AND PLAN: This is a very pleasant 70 years old white male with: 1)  recurrent non-small cell lung cancer status post several treatment regimen including neoadjuvant chemotherapy followed by left upper lobectomy followed by systemic chemotherapy as well as maintenance treatment with Avastin and has been observation since June of 2010 and with no evidence for disease recurrence until November 2015 when he was found to have hypermetabolic activity in the left lower lobe.. The patient underwent redo thoracotomy with complete left pneumonectomy. He is feeling fine today with no specific complaints. The recent CT scan of the chest showed no evidence for disease recurrence.  2) recent diagnosis of bladder cancer: Status post resection at [facility]. The recent CT scan of the abdomen and pelvis on 08/20/2015 showed no evidence for disease recurrence. He is scheduled for repeat CT scan of the chest, abdomen and pelvis at [facility]  in 6 months. I recommended for the patient to continue on observation. I will see him back for follow-up visit in one year for evaluation with repeat blood work.  He was advised to call immediately if he has any concerning symptoms in the interval.  The patient voices understanding of current disease status and treatment options and is in agreement with the current care plan.  All questions were answered. The patient knows to call the clinic with any problems, questions or concerns. We can certainly see the patient much sooner if necessary.  Disclaimer: This note was dictated with voice recognition software. Similar sounding words can inadvertently be transcribed and may be missed upon  review.

## 2015-10-15 ENCOUNTER — Ambulatory Visit (HOSPITAL_BASED_OUTPATIENT_CLINIC_OR_DEPARTMENT_OTHER): Payer: Medicare Other

## 2015-10-15 DIAGNOSIS — Z452 Encounter for adjustment and management of vascular access device: Secondary | ICD-10-CM

## 2015-10-15 DIAGNOSIS — Z85118 Personal history of other malignant neoplasm of bronchus and lung: Secondary | ICD-10-CM

## 2015-10-15 DIAGNOSIS — C3432 Malignant neoplasm of lower lobe, left bronchus or lung: Secondary | ICD-10-CM

## 2015-10-15 DIAGNOSIS — Z95828 Presence of other vascular implants and grafts: Secondary | ICD-10-CM

## 2015-10-15 MED ORDER — HEPARIN SOD (PORK) LOCK FLUSH 100 UNIT/ML IV SOLN
500.0000 [IU] | Freq: Once | INTRAVENOUS | Status: AC | PRN
  Administered 2015-10-15: 500 [IU] via INTRAVENOUS
  Filled 2015-10-15: qty 5

## 2015-10-15 MED ORDER — SODIUM CHLORIDE 0.9 % IJ SOLN
10.0000 mL | INTRAMUSCULAR | Status: DC | PRN
  Administered 2015-10-15: 10 mL via INTRAVENOUS
  Filled 2015-10-15: qty 10

## 2015-10-15 NOTE — Patient Instructions (Signed)

## 2016-02-23 ENCOUNTER — Telehealth: Payer: Self-pay | Admitting: Internal Medicine

## 2016-02-23 NOTE — Telephone Encounter (Signed)
Pt called to sch next flush appt. Gave pt appt 3/19 at 1130 am per request

## 2016-04-04 ENCOUNTER — Ambulatory Visit (HOSPITAL_BASED_OUTPATIENT_CLINIC_OR_DEPARTMENT_OTHER): Payer: Medicare Other

## 2016-04-04 VITALS — BP 186/76 | HR 56 | Temp 97.7°F | Resp 18

## 2016-04-04 DIAGNOSIS — Z452 Encounter for adjustment and management of vascular access device: Secondary | ICD-10-CM

## 2016-04-04 DIAGNOSIS — Z85118 Personal history of other malignant neoplasm of bronchus and lung: Secondary | ICD-10-CM | POA: Diagnosis not present

## 2016-04-04 DIAGNOSIS — C3432 Malignant neoplasm of lower lobe, left bronchus or lung: Secondary | ICD-10-CM

## 2016-04-04 DIAGNOSIS — Z95828 Presence of other vascular implants and grafts: Secondary | ICD-10-CM

## 2016-04-04 MED ORDER — HEPARIN SOD (PORK) LOCK FLUSH 100 UNIT/ML IV SOLN
500.0000 [IU] | Freq: Once | INTRAVENOUS | Status: AC | PRN
  Administered 2016-04-04: 500 [IU] via INTRAVENOUS
  Filled 2016-04-04: qty 5

## 2016-04-04 MED ORDER — SODIUM CHLORIDE 0.9 % IJ SOLN
10.0000 mL | INTRAMUSCULAR | Status: DC | PRN
  Administered 2016-04-04: 10 mL via INTRAVENOUS
  Filled 2016-04-04: qty 10

## 2016-04-04 NOTE — Patient Instructions (Signed)
Implanted Port Home Guide An implanted port is a type of central line that is placed under the skin. Central lines are used to provide IV access when treatment or nutrition needs to be given through a person's veins. Implanted ports are used for long-term IV access. An implanted port may be placed because:  You need IV medicine that would be irritating to the small veins in your hands or arms.  You need long-term IV medicines, such as antibiotics.  You need IV nutrition for a long period.  You need frequent blood draws for lab tests.  You need dialysis.  Implanted ports are usually placed in the chest area, but they can also be placed in the upper arm, the abdomen, or the leg. An implanted port has two main parts:  Reservoir. The reservoir is round and will appear as a small, raised area under your skin. The reservoir is the part where a needle is inserted to give medicines or draw blood.  Catheter. The catheter is a thin, flexible tube that extends from the reservoir. The catheter is placed into a large vein. Medicine that is inserted into the reservoir goes into the catheter and then into the vein.  How will I care for my incision site? Do not get the incision site wet. Bathe or shower as directed by your health care provider. How is my port accessed? Special steps must be taken to access the port:  Before the port is accessed, a numbing cream can be placed on the skin. This helps numb the skin over the port site.  Your health care provider uses a sterile technique to access the port. ? Your health care provider must put on a mask and sterile gloves. ? The skin over your port is cleaned carefully with an antiseptic and allowed to dry. ? The port is gently pinched between sterile gloves, and a needle is inserted into the port.  Only "non-coring" port needles should be used to access the port. Once the port is accessed, a blood return should be checked. This helps ensure that the port  is in the vein and is not clogged.  If your port needs to remain accessed for a constant infusion, a clear (transparent) bandage will be placed over the needle site. The bandage and needle will need to be changed every week, or as directed by your health care provider.  Keep the bandage covering the needle clean and dry. Do not get it wet. Follow your health care provider's instructions on how to take a shower or bath while the port is accessed.  If your port does not need to stay accessed, no bandage is needed over the port.  What is flushing? Flushing helps keep the port from getting clogged. Follow your health care provider's instructions on how and when to flush the port. Ports are usually flushed with saline solution or a medicine called heparin. The need for flushing will depend on how the port is used.  If the port is used for intermittent medicines or blood draws, the port will need to be flushed: ? After medicines have been given. ? After blood has been drawn. ? As part of routine maintenance.  If a constant infusion is running, the port may not need to be flushed.  How long will my port stay implanted? The port can stay in for as long as your health care provider thinks it is needed. When it is time for the port to come out, surgery will be   done to remove it. The procedure is similar to the one performed when the port was put in. When should I seek immediate medical care? When you have an implanted port, you should seek immediate medical care if:  You notice a bad smell coming from the incision site.  You have swelling, redness, or drainage at the incision site.  You have more swelling or pain at the port site or the surrounding area.  You have a fever that is not controlled with medicine.  This information is not intended to replace advice given to you by your health care provider. Make sure you discuss any questions you have with your health care provider. Document  Released: 01/03/2005 Document Revised: 06/11/2015 Document Reviewed: 09/10/2012 Elsevier Interactive Patient Education  2017 [employer]  

## 2016-05-16 ENCOUNTER — Ambulatory Visit (HOSPITAL_BASED_OUTPATIENT_CLINIC_OR_DEPARTMENT_OTHER): Payer: Medicare Other

## 2016-05-16 VITALS — BP 170/86 | HR 55 | Temp 97.6°F | Resp 18

## 2016-05-16 DIAGNOSIS — Z452 Encounter for adjustment and management of vascular access device: Secondary | ICD-10-CM

## 2016-05-16 DIAGNOSIS — C3432 Malignant neoplasm of lower lobe, left bronchus or lung: Secondary | ICD-10-CM

## 2016-05-16 DIAGNOSIS — Z95828 Presence of other vascular implants and grafts: Secondary | ICD-10-CM

## 2016-05-16 MED ORDER — HEPARIN SOD (PORK) LOCK FLUSH 100 UNIT/ML IV SOLN
500.0000 [IU] | Freq: Once | INTRAVENOUS | Status: AC | PRN
  Administered 2016-05-16: 500 [IU] via INTRAVENOUS
  Filled 2016-05-16: qty 5

## 2016-05-16 MED ORDER — SODIUM CHLORIDE 0.9 % IJ SOLN
10.0000 mL | INTRAMUSCULAR | Status: DC | PRN
  Administered 2016-05-16: 10 mL via INTRAVENOUS
  Filled 2016-05-16: qty 10

## 2016-05-16 NOTE — Patient Instructions (Signed)
Implanted Port Home Guide An implanted port is a type of central line that is placed under the skin. Central lines are used to provide IV access when treatment or nutrition needs to be given through a person's veins. Implanted ports are used for long-term IV access. An implanted port may be placed because:  You need IV medicine that would be irritating to the small veins in your hands or arms.  You need long-term IV medicines, such as antibiotics.  You need IV nutrition for a long period.  You need frequent blood draws for lab tests.  You need dialysis.  Implanted ports are usually placed in the chest area, but they can also be placed in the upper arm, the abdomen, or the leg. An implanted port has two main parts:  Reservoir. The reservoir is round and will appear as a small, raised area under your skin. The reservoir is the part where a needle is inserted to give medicines or draw blood.  Catheter. The catheter is a thin, flexible tube that extends from the reservoir. The catheter is placed into a large vein. Medicine that is inserted into the reservoir goes into the catheter and then into the vein.  How will I care for my incision site? Do not get the incision site wet. Bathe or shower as directed by your health care provider. How is my port accessed? Special steps must be taken to access the port:  Before the port is accessed, a numbing cream can be placed on the skin. This helps numb the skin over the port site.  Your health care provider uses a sterile technique to access the port. ? Your health care provider must put on a mask and sterile gloves. ? The skin over your port is cleaned carefully with an antiseptic and allowed to dry. ? The port is gently pinched between sterile gloves, and a needle is inserted into the port.  Only "non-coring" port needles should be used to access the port. Once the port is accessed, a blood return should be checked. This helps ensure that the port  is in the vein and is not clogged.  If your port needs to remain accessed for a constant infusion, a clear (transparent) bandage will be placed over the needle site. The bandage and needle will need to be changed every week, or as directed by your health care provider.  Keep the bandage covering the needle clean and dry. Do not get it wet. Follow your health care provider's instructions on how to take a shower or bath while the port is accessed.  If your port does not need to stay accessed, no bandage is needed over the port.  What is flushing? Flushing helps keep the port from getting clogged. Follow your health care provider's instructions on how and when to flush the port. Ports are usually flushed with saline solution or a medicine called heparin. The need for flushing will depend on how the port is used.  If the port is used for intermittent medicines or blood draws, the port will need to be flushed: ? After medicines have been given. ? After blood has been drawn. ? As part of routine maintenance.  If a constant infusion is running, the port may not need to be flushed.  How long will my port stay implanted? The port can stay in for as long as your health care provider thinks it is needed. When it is time for the port to come out, surgery will be   done to remove it. The procedure is similar to the one performed when the port was put in. When should I seek immediate medical care? When you have an implanted port, you should seek immediate medical care if:  You notice a bad smell coming from the incision site.  You have swelling, redness, or drainage at the incision site.  You have more swelling or pain at the port site or the surrounding area.  You have a fever that is not controlled with medicine.  This information is not intended to replace advice given to you by your health care provider. Make sure you discuss any questions you have with your health care provider. Document  Released: 01/03/2005 Document Revised: 06/11/2015 Document Reviewed: 09/10/2012 Elsevier Interactive Patient Education  2017 [employer]  

## 2016-07-11 ENCOUNTER — Telehealth: Payer: Self-pay | Admitting: Internal Medicine

## 2016-07-11 ENCOUNTER — Ambulatory Visit (HOSPITAL_BASED_OUTPATIENT_CLINIC_OR_DEPARTMENT_OTHER): Payer: Medicare Other

## 2016-07-11 DIAGNOSIS — Z452 Encounter for adjustment and management of vascular access device: Secondary | ICD-10-CM

## 2016-07-11 DIAGNOSIS — Z95828 Presence of other vascular implants and grafts: Secondary | ICD-10-CM

## 2016-07-11 DIAGNOSIS — C3432 Malignant neoplasm of lower lobe, left bronchus or lung: Secondary | ICD-10-CM | POA: Diagnosis not present

## 2016-07-11 MED ORDER — SODIUM CHLORIDE 0.9 % IJ SOLN
10.0000 mL | INTRAMUSCULAR | Status: DC | PRN
  Administered 2016-07-11: 10 mL via INTRAVENOUS
  Filled 2016-07-11: qty 10

## 2016-07-11 MED ORDER — HEPARIN SOD (PORK) LOCK FLUSH 100 UNIT/ML IV SOLN
500.0000 [IU] | Freq: Once | INTRAVENOUS | Status: AC | PRN
  Administered 2016-07-11: 500 [IU] via INTRAVENOUS
  Filled 2016-07-11: qty 5

## 2016-07-11 NOTE — Telephone Encounter (Signed)
Rescheduled appt per patient wanted to add flush appt for 08/23/16. Patient aware - gave calender.

## 2016-08-23 ENCOUNTER — Ambulatory Visit: Payer: Medicare Other | Admitting: Internal Medicine

## 2016-08-23 ENCOUNTER — Other Ambulatory Visit: Payer: Medicare Other

## 2016-08-24 ENCOUNTER — Other Ambulatory Visit (HOSPITAL_BASED_OUTPATIENT_CLINIC_OR_DEPARTMENT_OTHER): Payer: Medicare Other

## 2016-08-24 ENCOUNTER — Encounter: Payer: Self-pay | Admitting: Internal Medicine

## 2016-08-24 ENCOUNTER — Ambulatory Visit (HOSPITAL_BASED_OUTPATIENT_CLINIC_OR_DEPARTMENT_OTHER): Payer: Medicare Other | Admitting: Internal Medicine

## 2016-08-24 DIAGNOSIS — C3432 Malignant neoplasm of lower lobe, left bronchus or lung: Secondary | ICD-10-CM

## 2016-08-24 DIAGNOSIS — Z8551 Personal history of malignant neoplasm of bladder: Secondary | ICD-10-CM | POA: Diagnosis not present

## 2016-08-24 DIAGNOSIS — Z85118 Personal history of other malignant neoplasm of bronchus and lung: Secondary | ICD-10-CM | POA: Diagnosis not present

## 2016-08-24 DIAGNOSIS — C678 Malignant neoplasm of overlapping sites of bladder: Secondary | ICD-10-CM

## 2016-08-24 LAB — CBC WITH DIFFERENTIAL/PLATELET
BASO%: 0.5 % (ref 0.0–2.0)
Basophils Absolute: 0 10*3/uL (ref 0.0–0.1)
EOS%: 2.4 % (ref 0.0–7.0)
Eosinophils Absolute: 0.2 10*3/uL (ref 0.0–0.5)
HCT: 40.4 % (ref 38.4–49.9)
HGB: 13.5 g/dL (ref 13.0–17.1)
LYMPH%: 30.5 % (ref 14.0–49.0)
MCH: 31.5 pg (ref 27.2–33.4)
MCHC: 33.4 g/dL (ref 32.0–36.0)
MCV: 94.2 fL (ref 79.3–98.0)
MONO#: 0.5 10*3/uL (ref 0.1–0.9)
MONO%: 7.5 % (ref 0.0–14.0)
NEUT#: 3.9 10*3/uL (ref 1.5–6.5)
NEUT%: 59.1 % (ref 39.0–75.0)
Platelets: 152 10*3/uL (ref 140–400)
RBC: 4.29 10*6/uL (ref 4.20–5.82)
RDW: 13.6 % (ref 11.0–14.6)
WBC: 6.6 10*3/uL (ref 4.0–10.3)
lymph#: 2 10*3/uL (ref 0.9–3.3)

## 2016-08-24 LAB — COMPREHENSIVE METABOLIC PANEL
ALT: 16 U/L (ref 0–55)
ANION GAP: 8 meq/L (ref 3–11)
AST: 19 U/L (ref 5–34)
Albumin: 3.6 g/dL (ref 3.5–5.0)
Alkaline Phosphatase: 111 U/L (ref 40–150)
BUN: 23 mg/dL (ref 7.0–26.0)
CHLORIDE: 104 meq/L (ref 98–109)
CO2: 26 meq/L (ref 22–29)
CREATININE: 1.1 mg/dL (ref 0.7–1.3)
Calcium: 9.3 mg/dL (ref 8.4–10.4)
EGFR: 68 mL/min/{1.73_m2} — ABNORMAL LOW (ref >90)
Glucose: 102 mg/dl (ref 70–140)
POTASSIUM: 4 meq/L (ref 3.5–5.1)
Sodium: 138 mEq/L (ref 136–145)
Total Bilirubin: 0.72 mg/dL (ref 0.20–1.20)
Total Protein: 6.5 g/dL (ref 6.4–8.3)

## 2016-08-24 NOTE — Progress Notes (Signed)
[place] [phone]   [phone]  OFFICE PROGRESS NOTE  Maylon Peppers, MD [place] [address]  PRINCIPAL DIAGNOSIS:  1) Recurrent non-small cell lung cancer initially diagnosed as stage IIIA September 2006.  2) diagnosis of early stage bladder cancer in 2014: Status post resection followed by 6 months of intravesical BCG. 3) He has recurrent hematuria. Repeat cystoscopy and biopsy 01/16/2013: CIS, normal RPGs. CT scan abdomen: bilateral renal cysts. No adenopathy. Cystoscopy and biopsy December 2015: Positive cytologies 4) recurrent non-small cell lung cancer, adenocarcinoma involving the left lower lobe diagnosed in November 2015.  PRIOR THERAPY:  1. Status post 3 cycles of neoadjuvant chemotherapy with carboplatin and docetaxel, last dose was given November 22, 2004. 2. Status post left upper lobectomy with lymph node dissection under the care of Dr. Arlyce Dice on January 11, 2005. 3. Status post pericardial window on February 02, 2005 for evacuation of postoperative pericardial tamponade and the fluid was negative for malignancy. 4. Status post 3 cycles of adjuvant chemotherapy with carboplatin and gemcitabine. Last dose was given May 13, 2005. 5. Status post 5 cycles of systemic chemotherapy with carboplatin, paclitaxel and Avastin for disease recurrence. Last dose was given January 04, 2006 and the patient had stable disease by the end of the last cycle. 6. Status post maintenance treatment with Avastin 15 mg/kg given every 3 weeks. The patient is status post 42 cycles, discontinued on July 02, 2008 after the patient had stable disease for more than 2 years. 7. Status post Bilateral selective ureteral cytology, Bilateral retrograde pyelography, Transurethral resection of bladder tumor, Random bladder biopsies, Prostatic urethral biopsy and Transrectal biopsy of the prostate under the care of Dr. Rutherford Limerick at [facility]. 8. Status post redo thoracotomy with left pneumonectomy under the care of Dr. Lianne Moris at [facility] on 03/05/2014.  CURRENT THERAPY: Observation.  INTERVAL HISTORY: Wesley Harmon 71 y.o. male returns to the clinic today for follow-up visit. The patient is feeling fine today with no specific complaints. He is currently work on weight loss and he lost around 10 pounds since his last visit. He denied having any chest pain, shortness breath, cough or hemoptysis. He denied having any fever or chills. The patient denied having any nausea, vomiting, diarrhea or constipation. He was seen recently at [facility] and had chest x-ray performed on 08/18/2016 that showed persistent complete opacification of the left hemothorax with postsurgical changes with increased left toward mediastinal shift possibly secondary to decreased fluid in the left hemothorax. The hyperextended the right lung is without consolidation.  MEDICAL HISTORY: Past Medical History:  Diagnosis Date  . Bladder cancer ([place]) 01/02/13  . GERD (gastroesophageal reflux disease)   . Hyperlipemia   . Hypertension   . lung ca dx'd 07/2004   chemo comp 06/2008  . Lung cancer ([place])   . Neuropathy ([facility])     ALLERGIES:  is allergic to dilaudid [hydromorphone hcl].  MEDICATIONS:  Current Outpatient Prescriptions  Medication Sig Dispense Refill  . Albuterol Sulfate 108 (90 Base) MCG/ACT AEPB Inhale 2 puffs into the lungs every 6 (six) hours as needed.    Marland Kitchen allopurinol (ZYLOPRIM) 100 MG tablet Take 100 mg by mouth daily as needed.    Marland Kitchen aspirin EC 81 MG tablet Take 81 mg by mouth.    Marland Kitchen atorvastatin (LIPITOR) 20 MG tablet Take 20 mg by mouth daily.    . Cholecalciferol (  VITAMIN D3) 1000 units CAPS Take 1,000 mg by mouth daily.    . colchicine 0.6 MG tablet Take 1 tablet (0.6 mg total) by mouth 2 (two) times daily. 6 tablet 0  . cyanocobalamin 1000 MCG tablet Take 1,000 mcg by mouth daily.      Marland Kitchen FLOVENT HFA 220 MCG/ACT inhaler     . gabapentin (NEURONTIN) 300 MG capsule TAKE 1 CAPSULE 3 TIMES A   DAY 270 capsule 1  . INCRUSE ELLIPTA 62.5 MCG/INH AEPB     . Multiple Vitamin (MULTI-VITAMINS) TABS Take 1 tablet by mouth daily.    Marland Kitchen omeprazole (PRILOSEC) 20 MG capsule Take 20 mg by mouth daily.    . polyethylene glycol (MIRALAX / GLYCOLAX) packet Take 17 g by mouth daily.    Marland Kitchen umeclidinium bromide (INCRUSE ELLIPTA) 62.5 MCG/INH AEPB Inhale 1 puff into the lungs daily.     No current facility-administered medications for this visit.     REVIEW OF SYSTEMS:  A comprehensive review of systems was negative.   PHYSICAL EXAMINATION: General appearance: alert, cooperative and no distress Head: Normocephalic, without obvious abnormality, atraumatic Neck: no adenopathy Lymph nodes: Cervical, supraclavicular, and axillary nodes normal. Resp: Clear to auscultation on the right and absent breath sound on the left Back: negative Cardio: regular rate and rhythm, S1, S2 normal, no murmur, click, rub or gallop GI: soft, non-tender; bowel sounds normal; no masses,  no organomegaly Extremities: extremities normal, atraumatic, no cyanosis or edema  ECOG PERFORMANCE STATUS: 1 - Symptomatic but completely ambulatory  There were no vitals taken for this visit.  LABORATORY DATA: Lab Results  Component Value Date   WBC 6.6 08/24/2016   HGB 13.5 08/24/2016   HCT 40.4 08/24/2016   MCV 94.2 08/24/2016   PLT 152 08/24/2016      Chemistry      Component Value Date/Time   NA 138 08/24/2016 1501   K 4.0 08/24/2016 1501   CL 105 08/11/2013 2340   CL 102 09/30/2011 0843   CO2 26 08/24/2016 1501   BUN 23.0 08/24/2016 1501   CREATININE 1.1 08/24/2016 1501      Component Value Date/Time   CALCIUM 9.3 08/24/2016 1501   ALKPHOS 111 08/24/2016 1501   AST 19 08/24/2016 1501   ALT 16 08/24/2016 1501   BILITOT 0.72 08/24/2016 1501       RADIOGRAPHIC STUDIES:  Impression: Persistent complete  opacification of the left hemithorax with  postsurgical changes. Slightly increased leftward mediastinal shift  possibly secondary to decreased fluid in the left hemithorax. The  hyperexpanded right lung is without consolidation.    Electronically Signed [place]:OVZC Ranae Pila, MD, [facility]  Electronically Signed on:08/18/2016 10:41 AM          ASSESSMENT AND PLAN: This is a very pleasant 71 years old white male with: 1)  recurrent non-small cell lung cancer status post several treatment regimen including neoadjuvant chemotherapy followed by left upper lobectomy followed by systemic chemotherapy as well as maintenance treatment with Avastin and has been observation since June of 2010 and with no evidence for disease recurrence until November 2015 when he was found to have hypermetabolic activity in the left lower lobe.. The patient underwent redo thoracotomy with complete left pneumonectomy The patient is currently on observation and he is feeling fine. His most recent chest x-ray showed no concerning finding for recurrence. I recommended for him to continue on observation with repeat CT scan of the chest, abdomen and pelvis in one year.  2)  recent diagnosis of bladder cancer: Status post resection at [facility].I recommended for the patient to continue on observation. He was advised to call immediately if he has any concerning symptoms in the interval.  The patient voices understanding of current disease status and treatment options and is in agreement with the current care plan.  All questions were answered. The patient knows to call the clinic with any problems, questions or concerns. We can certainly see the patient much sooner if necessary.  Disclaimer: This note was dictated with voice recognition software. Similar sounding words can inadvertently be transcribed and may be missed upon review.

## 2016-08-29 ENCOUNTER — Telehealth: Payer: Self-pay | Admitting: Internal Medicine

## 2016-08-29 NOTE — Telephone Encounter (Signed)
Called in regards to appointments and left a message

## 2016-10-24 ENCOUNTER — Ambulatory Visit (HOSPITAL_BASED_OUTPATIENT_CLINIC_OR_DEPARTMENT_OTHER): Payer: Federal, State, Local not specified - PPO

## 2016-10-24 VITALS — BP 148/84 | HR 51 | Temp 97.8°F | Resp 18

## 2016-10-24 DIAGNOSIS — C3432 Malignant neoplasm of lower lobe, left bronchus or lung: Secondary | ICD-10-CM

## 2016-10-24 DIAGNOSIS — Z452 Encounter for adjustment and management of vascular access device: Secondary | ICD-10-CM

## 2016-10-24 DIAGNOSIS — Z95828 Presence of other vascular implants and grafts: Secondary | ICD-10-CM

## 2016-10-24 MED ORDER — SODIUM CHLORIDE 0.9 % IJ SOLN
10.0000 mL | INTRAMUSCULAR | Status: DC | PRN
  Administered 2016-10-24: 10 mL via INTRAVENOUS
  Filled 2016-10-24: qty 10

## 2016-10-24 MED ORDER — HEPARIN SOD (PORK) LOCK FLUSH 100 UNIT/ML IV SOLN
500.0000 [IU] | Freq: Once | INTRAVENOUS | Status: AC | PRN
  Administered 2016-10-24: 500 [IU] via INTRAVENOUS
  Filled 2016-10-24: qty 5

## 2016-10-24 NOTE — Patient Instructions (Signed)
Implanted Port Home Guide An implanted port is a type of central line that is placed under the skin. Central lines are used to provide IV access when treatment or nutrition needs to be given through a person's veins. Implanted ports are used for long-term IV access. An implanted port may be placed because:  You need IV medicine that would be irritating to the small veins in your hands or arms.  You need long-term IV medicines, such as antibiotics.  You need IV nutrition for a long period.  You need frequent blood draws for lab tests.  You need dialysis.  Implanted ports are usually placed in the chest area, but they can also be placed in the upper arm, the abdomen, or the leg. An implanted port has two main parts:  Reservoir. The reservoir is round and will appear as a small, raised area under your skin. The reservoir is the part where a needle is inserted to give medicines or draw blood.  Catheter. The catheter is a thin, flexible tube that extends from the reservoir. The catheter is placed into a large vein. Medicine that is inserted into the reservoir goes into the catheter and then into the vein.  How will I care for my incision site? Do not get the incision site wet. Bathe or shower as directed by your health care provider. How is my port accessed? Special steps must be taken to access the port:  Before the port is accessed, a numbing cream can be placed on the skin. This helps numb the skin over the port site.  Your health care provider uses a sterile technique to access the port. ? Your health care provider must put on a mask and sterile gloves. ? The skin over your port is cleaned carefully with an antiseptic and allowed to dry. ? The port is gently pinched between sterile gloves, and a needle is inserted into the port.  Only "non-coring" port needles should be used to access the port. Once the port is accessed, a blood return should be checked. This helps ensure that the port  is in the vein and is not clogged.  If your port needs to remain accessed for a constant infusion, a clear (transparent) bandage will be placed over the needle site. The bandage and needle will need to be changed every week, or as directed by your health care provider.  Keep the bandage covering the needle clean and dry. Do not get it wet. Follow your health care provider's instructions on how to take a shower or bath while the port is accessed.  If your port does not need to stay accessed, no bandage is needed over the port.  What is flushing? Flushing helps keep the port from getting clogged. Follow your health care provider's instructions on how and when to flush the port. Ports are usually flushed with saline solution or a medicine called heparin. The need for flushing will depend on how the port is used.  If the port is used for intermittent medicines or blood draws, the port will need to be flushed: ? After medicines have been given. ? After blood has been drawn. ? As part of routine maintenance.  If a constant infusion is running, the port may not need to be flushed.  How long will my port stay implanted? The port can stay in for as long as your health care provider thinks it is needed. When it is time for the port to come out, surgery will be   done to remove it. The procedure is similar to the one performed when the port was put in. When should I seek immediate medical care? When you have an implanted port, you should seek immediate medical care if:  You notice a bad smell coming from the incision site.  You have swelling, redness, or drainage at the incision site.  You have more swelling or pain at the port site or the surrounding area.  You have a fever that is not controlled with medicine.  This information is not intended to replace advice given to you by your health care provider. Make sure you discuss any questions you have with your health care provider. Document  Released: 01/03/2005 Document Revised: 06/11/2015 Document Reviewed: 09/10/2012 Elsevier Interactive Patient Education  2017 [employer]  

## 2017-02-01 ENCOUNTER — Inpatient Hospital Stay: Payer: Medicare Other | Attending: Internal Medicine

## 2017-02-01 DIAGNOSIS — Z95828 Presence of other vascular implants and grafts: Secondary | ICD-10-CM

## 2017-02-01 DIAGNOSIS — C3432 Malignant neoplasm of lower lobe, left bronchus or lung: Secondary | ICD-10-CM

## 2017-02-01 DIAGNOSIS — Z452 Encounter for adjustment and management of vascular access device: Secondary | ICD-10-CM | POA: Diagnosis present

## 2017-02-01 MED ORDER — HEPARIN SOD (PORK) LOCK FLUSH 100 UNIT/ML IV SOLN
500.0000 [IU] | Freq: Once | INTRAVENOUS | Status: AC | PRN
  Administered 2017-02-01: 500 [IU] via INTRAVENOUS
  Filled 2017-02-01: qty 5

## 2017-02-01 MED ORDER — SODIUM CHLORIDE 0.9 % IJ SOLN
10.0000 mL | INTRAMUSCULAR | Status: DC | PRN
  Administered 2017-02-01: 10 mL via INTRAVENOUS
  Filled 2017-02-01: qty 10

## 2017-03-29 ENCOUNTER — Inpatient Hospital Stay: Payer: Medicare Other | Attending: Internal Medicine

## 2017-03-29 DIAGNOSIS — C3432 Malignant neoplasm of lower lobe, left bronchus or lung: Secondary | ICD-10-CM | POA: Diagnosis present

## 2017-03-29 DIAGNOSIS — Z95828 Presence of other vascular implants and grafts: Secondary | ICD-10-CM | POA: Diagnosis not present

## 2017-03-29 MED ORDER — HEPARIN SOD (PORK) LOCK FLUSH 100 UNIT/ML IV SOLN
500.0000 [IU] | Freq: Once | INTRAVENOUS | Status: AC | PRN
  Administered 2017-03-29: 500 [IU] via INTRAVENOUS
  Filled 2017-03-29: qty 5

## 2017-03-29 MED ORDER — SODIUM CHLORIDE 0.9 % IJ SOLN
10.0000 mL | INTRAMUSCULAR | Status: DC | PRN
  Administered 2017-03-29: 10 mL via INTRAVENOUS
  Filled 2017-03-29: qty 10

## 2017-05-24 ENCOUNTER — Inpatient Hospital Stay: Payer: Medicare Other | Attending: Internal Medicine

## 2017-05-24 DIAGNOSIS — C3432 Malignant neoplasm of lower lobe, left bronchus or lung: Secondary | ICD-10-CM | POA: Diagnosis present

## 2017-05-24 DIAGNOSIS — Z452 Encounter for adjustment and management of vascular access device: Secondary | ICD-10-CM | POA: Diagnosis present

## 2017-05-24 DIAGNOSIS — Z95828 Presence of other vascular implants and grafts: Secondary | ICD-10-CM

## 2017-05-24 MED ORDER — HEPARIN SOD (PORK) LOCK FLUSH 100 UNIT/ML IV SOLN
500.0000 [IU] | Freq: Once | INTRAVENOUS | Status: AC | PRN
  Administered 2017-05-24: 500 [IU] via INTRAVENOUS
  Filled 2017-05-24: qty 5

## 2017-05-24 MED ORDER — SODIUM CHLORIDE 0.9 % IJ SOLN
10.0000 mL | INTRAMUSCULAR | Status: DC | PRN
  Administered 2017-05-24: 10 mL via INTRAVENOUS
  Filled 2017-05-24: qty 10

## 2017-05-24 NOTE — Patient Instructions (Signed)

## 2017-07-19 ENCOUNTER — Inpatient Hospital Stay: Payer: Medicare Other | Attending: Internal Medicine

## 2017-07-19 DIAGNOSIS — C3432 Malignant neoplasm of lower lobe, left bronchus or lung: Secondary | ICD-10-CM | POA: Diagnosis present

## 2017-07-19 DIAGNOSIS — Z452 Encounter for adjustment and management of vascular access device: Secondary | ICD-10-CM | POA: Diagnosis present

## 2017-07-19 DIAGNOSIS — Z95828 Presence of other vascular implants and grafts: Secondary | ICD-10-CM

## 2017-07-19 MED ORDER — SODIUM CHLORIDE 0.9 % IJ SOLN
10.0000 mL | INTRAMUSCULAR | Status: DC | PRN
  Administered 2017-07-19: 10 mL via INTRAVENOUS
  Filled 2017-07-19: qty 10

## 2017-07-19 MED ORDER — HEPARIN SOD (PORK) LOCK FLUSH 100 UNIT/ML IV SOLN
500.0000 [IU] | Freq: Once | INTRAVENOUS | Status: AC | PRN
  Administered 2017-07-19: 500 [IU] via INTRAVENOUS
  Filled 2017-07-19: qty 5

## 2017-08-18 ENCOUNTER — Inpatient Hospital Stay: Payer: Medicare Other | Attending: Internal Medicine

## 2017-08-18 ENCOUNTER — Ambulatory Visit (HOSPITAL_COMMUNITY)
Admission: RE | Admit: 2017-08-18 | Discharge: 2017-08-18 | Disposition: A | Discharge status: Home / Self Care | Payer: Medicare Other | Source: Ambulatory Visit | Attending: Internal Medicine | Admitting: Internal Medicine

## 2017-08-18 DIAGNOSIS — C678 Malignant neoplasm of overlapping sites of bladder: Secondary | ICD-10-CM

## 2017-08-18 DIAGNOSIS — Z902 Acquired absence of lung [part of]: Secondary | ICD-10-CM | POA: Insufficient documentation

## 2017-08-18 DIAGNOSIS — Z9221 Personal history of antineoplastic chemotherapy: Secondary | ICD-10-CM | POA: Insufficient documentation

## 2017-08-18 DIAGNOSIS — N2889 Other specified disorders of kidney and ureter: Secondary | ICD-10-CM | POA: Insufficient documentation

## 2017-08-18 DIAGNOSIS — Z85118 Personal history of other malignant neoplasm of bronchus and lung: Secondary | ICD-10-CM | POA: Diagnosis not present

## 2017-08-18 DIAGNOSIS — Z452 Encounter for adjustment and management of vascular access device: Secondary | ICD-10-CM | POA: Diagnosis present

## 2017-08-18 DIAGNOSIS — N289 Disorder of kidney and ureter, unspecified: Secondary | ICD-10-CM | POA: Diagnosis not present

## 2017-08-18 DIAGNOSIS — C3432 Malignant neoplasm of lower lobe, left bronchus or lung: Secondary | ICD-10-CM | POA: Diagnosis not present

## 2017-08-18 DIAGNOSIS — K435 Parastomal hernia without obstruction or  gangrene: Secondary | ICD-10-CM | POA: Insufficient documentation

## 2017-08-18 DIAGNOSIS — R918 Other nonspecific abnormal finding of lung field: Secondary | ICD-10-CM | POA: Insufficient documentation

## 2017-08-18 DIAGNOSIS — Z906 Acquired absence of other parts of urinary tract: Secondary | ICD-10-CM | POA: Insufficient documentation

## 2017-08-18 DIAGNOSIS — Z8551 Personal history of malignant neoplasm of bladder: Secondary | ICD-10-CM | POA: Diagnosis not present

## 2017-08-18 LAB — CBC WITH DIFFERENTIAL/PLATELET
Basophils Absolute: 0.1 10*3/uL (ref 0.0–0.1)
Basophils Relative: 1 %
EOS ABS: 0.2 10*3/uL (ref 0.0–0.5)
Eosinophils Relative: 3 %
HEMATOCRIT: 43.6 % (ref 38.4–49.9)
HEMOGLOBIN: 14.8 g/dL (ref 13.0–17.1)
LYMPHS ABS: 1.4 10*3/uL (ref 0.9–3.3)
Lymphocytes Relative: 21 %
MCH: 31.7 pg (ref 27.2–33.4)
MCHC: 33.9 g/dL (ref 32.0–36.0)
MCV: 93.4 fL (ref 79.3–98.0)
MONOS PCT: 8 %
Monocytes Absolute: 0.5 10*3/uL (ref 0.1–0.9)
NEUTROS PCT: 67 %
Neutro Abs: 4.5 10*3/uL (ref 1.5–6.5)
Platelets: 169 10*3/uL (ref 140–400)
RBC: 4.67 MIL/uL (ref 4.20–5.82)
RDW: 14.4 % (ref 11.0–14.6)
WBC: 6.6 10*3/uL (ref 4.0–10.3)

## 2017-08-18 LAB — COMPREHENSIVE METABOLIC PANEL
ALT: 15 U/L (ref 0–44)
AST: 16 U/L (ref 15–41)
Albumin: 3.9 g/dL (ref 3.5–5.0)
Alkaline Phosphatase: 120 U/L (ref 38–126)
Anion gap: 11 (ref 5–15)
BUN: 22 mg/dL (ref 8–23)
CHLORIDE: 105 mmol/L (ref 98–111)
CO2: 23 mmol/L (ref 22–32)
CREATININE: 1.14 mg/dL (ref 0.61–1.24)
Calcium: 9.1 mg/dL (ref 8.9–10.3)
GFR calc non Af Amer: >60 mL/min (ref >60)
Glucose, Bld: 98 mg/dL (ref 70–99)
POTASSIUM: 4.8 mmol/L (ref 3.5–5.1)
SODIUM: 139 mmol/L (ref 135–145)
Total Bilirubin: 0.8 mg/dL (ref 0.3–1.2)
Total Protein: 7.5 g/dL (ref 6.5–8.1)

## 2017-08-18 MED ORDER — IOPAMIDOL (ISOVUE-300) INJECTION 61%
100.0000 mL | Freq: Once | INTRAVENOUS | Status: AC | PRN
  Administered 2017-08-18: 100 mL via INTRAVENOUS

## 2017-08-21 ENCOUNTER — Inpatient Hospital Stay (HOSPITAL_BASED_OUTPATIENT_CLINIC_OR_DEPARTMENT_OTHER): Payer: Medicare Other | Admitting: Internal Medicine

## 2017-08-21 ENCOUNTER — Telehealth: Payer: Self-pay | Admitting: Internal Medicine

## 2017-08-21 ENCOUNTER — Encounter: Payer: Self-pay | Admitting: Internal Medicine

## 2017-08-21 VITALS — BP 141/71 | HR 69 | Temp 97.6°F | Resp 17 | Ht 70.0 in | Wt 245.0 lb

## 2017-08-21 DIAGNOSIS — R918 Other nonspecific abnormal finding of lung field: Secondary | ICD-10-CM | POA: Diagnosis not present

## 2017-08-21 DIAGNOSIS — N289 Disorder of kidney and ureter, unspecified: Secondary | ICD-10-CM | POA: Diagnosis not present

## 2017-08-21 DIAGNOSIS — Z9221 Personal history of antineoplastic chemotherapy: Secondary | ICD-10-CM | POA: Diagnosis not present

## 2017-08-21 DIAGNOSIS — Z8551 Personal history of malignant neoplasm of bladder: Secondary | ICD-10-CM

## 2017-08-21 DIAGNOSIS — Z85118 Personal history of other malignant neoplasm of bronchus and lung: Secondary | ICD-10-CM

## 2017-08-21 DIAGNOSIS — Z902 Acquired absence of lung [part of]: Secondary | ICD-10-CM

## 2017-08-21 DIAGNOSIS — C678 Malignant neoplasm of overlapping sites of bladder: Secondary | ICD-10-CM

## 2017-08-21 DIAGNOSIS — Z906 Acquired absence of other parts of urinary tract: Secondary | ICD-10-CM

## 2017-08-21 DIAGNOSIS — C3490 Malignant neoplasm of unspecified part of unspecified bronchus or lung: Secondary | ICD-10-CM

## 2017-08-21 DIAGNOSIS — C3432 Malignant neoplasm of lower lobe, left bronchus or lung: Secondary | ICD-10-CM

## 2017-08-21 NOTE — Telephone Encounter (Signed)
Scheduled appt per 8/5 los - sent reminder letter in the mail - lab and f/u in 1 year

## 2017-08-21 NOTE — Progress Notes (Signed)
[facility] [phone]   [phone]  OFFICE PROGRESS NOTE  Maylon Peppers, MD [address] [place] [address]  PRINCIPAL DIAGNOSIS:  1) Recurrent non-small cell lung cancer initially diagnosed as stage IIIA September 2006.  2) diagnosis of early stage bladder cancer in 2014: Status post resection followed by 6 months of intravesical BCG. 3) He has recurrent hematuria. Repeat cystoscopy and biopsy 01/16/2013: CIS, normal RPGs. CT scan abdomen: bilateral renal cysts. No adenopathy. Cystoscopy and biopsy December 2015: Positive cytologies 4) recurrent non-small cell lung cancer, adenocarcinoma involving the left lower lobe diagnosed in November 2015.  PRIOR THERAPY:  1. Status post 3 cycles of neoadjuvant chemotherapy with carboplatin and docetaxel, last dose was given November 22, 2004. 2. Status post left upper lobectomy with lymph node dissection under the care of Dr. Arlyce Dice on January 11, 2005. 3. Status post pericardial window on February 02, 2005 for evacuation of postoperative pericardial tamponade and the fluid was negative for malignancy. 4. Status post 3 cycles of adjuvant chemotherapy with carboplatin and gemcitabine. Last dose was given May 13, 2005. 5. Status post 5 cycles of systemic chemotherapy with carboplatin, paclitaxel and Avastin for disease recurrence. Last dose was given January 04, 2006 and the patient had stable disease by the end of the last cycle. 6. Status post maintenance treatment with Avastin 15 mg/kg given every 3 weeks. The patient is status post 42 cycles, discontinued on July 02, 2008 after the patient had stable disease for more than 2 years. 7. Status post Bilateral selective ureteral cytology, Bilateral retrograde pyelography, Transurethral resection of bladder tumor, Random bladder biopsies, Prostatic urethral biopsy and Transrectal biopsy of the prostate under the care of Dr. Rutherford Limerick at [facility]. 8. Status post redo thoracotomy with left pneumonectomy under the care of Dr. Lianne Moris at [facility] on 03/05/2014.  CURRENT THERAPY: Observation.  INTERVAL HISTORY: Wesley Harmon 72 y.o. male returns to the clinic today for annual follow-up visit.  The patient is feeling fine with no concerning complaints.  He celebrated his 50th anniversary with his wife recently.  He denied having any chest pain, shortness of breath, cough or hemoptysis.  He denied having any fever or chills.  He has no nausea, vomiting, diarrhea or constipation.  He has no recent weight loss or night sweats.  The patient had repeat CT scan of the chest, abdomen and pelvis performed recently and he is here for evaluation and discussion of his discuss results.  MEDICAL HISTORY: Past Medical History:  Diagnosis Date  . Bladder cancer ([place]) 01/02/13  . GERD (gastroesophageal reflux disease)   . Hyperlipemia   . Hypertension   . lung ca dx'd 07/2004   chemo comp 06/2008  . Lung cancer ([place])   . Neuropathy     ALLERGIES:  is allergic to dilaudid [hydromorphone hcl].  MEDICATIONS:  Current Outpatient Medications  Medication Sig Dispense Refill  . amLODipine (NORVASC) 10 MG tablet Take 10 mg by mouth daily.    Marland Kitchen aspirin EC 81 MG tablet Take 81 mg by mouth.    . Cholecalciferol (VITAMIN D3) 1000 units CAPS Take 1,000 mg by mouth daily.    . cyanocobalamin 1000 MCG tablet Take 1,000 mcg by mouth daily.    Marland Kitchen FLOVENT HFA 220 MCG/ACT inhaler     . INCRUSE ELLIPTA 62.5 MCG/INH AEPB     . umeclidinium bromide (INCRUSE ELLIPTA) 62.5 MCG/INH AEPB Inhale 1 puff  into the lungs daily.    . Albuterol Sulfate 108 (90 Base) MCG/ACT AEPB Inhale 2 puffs into the lungs every 6 (six) hours as needed.    Marland Kitchen allopurinol (ZYLOPRIM) 100 MG tablet Take 100 mg by mouth daily as needed.    . polyethylene glycol (MIRALAX / GLYCOLAX) packet Take 17 g by mouth daily.     No current facility-administered medications for  this visit.     REVIEW OF SYSTEMS:  A comprehensive review of systems was negative.   PHYSICAL EXAMINATION: General appearance: alert, cooperative and no distress Head: Normocephalic, without obvious abnormality, atraumatic Neck: no adenopathy Lymph nodes: Cervical, supraclavicular, and axillary nodes normal. Resp: Clear to auscultation on the right and absent breath sound on the left Back: symmetric, no curvature. ROM normal. No CVA tenderness. Cardio: regular rate and rhythm, S1, S2 normal, no murmur, click, rub or gallop GI: soft, non-tender; bowel sounds normal; no masses,  no organomegaly Extremities: extremities normal, atraumatic, no cyanosis or edema  ECOG PERFORMANCE STATUS: 1 - Symptomatic but completely ambulatory  Blood pressure (!) 141/71, pulse 69, temperature 97.6 F (36.4 C), temperature source Oral, resp. rate 17, height 5\' 10"  (1.778 m), weight 245 lb (111.1 kg), SpO2 94 %.  LABORATORY DATA: Lab Results  Component Value Date   WBC 6.6 08/18/2017   HGB 14.8 08/18/2017   HCT 43.6 08/18/2017   MCV 93.4 08/18/2017   PLT 169 08/18/2017      Chemistry      Component Value Date/Time   NA 139 08/18/2017 1042   NA 138 08/24/2016 1501   K 4.8 08/18/2017 1042   K 4.0 08/24/2016 1501   CL 105 08/18/2017 1042   CL 102 09/30/2011 0843   CO2 23 08/18/2017 1042   CO2 26 08/24/2016 1501   BUN 22 08/18/2017 1042   BUN 23.0 08/24/2016 1501   CREATININE 1.14 08/18/2017 1042   CREATININE 1.1 08/24/2016 1501      Component Value Date/Time   CALCIUM 9.1 08/18/2017 1042   CALCIUM 9.3 08/24/2016 1501   ALKPHOS 120 08/18/2017 1042   ALKPHOS 111 08/24/2016 1501   AST 16 08/18/2017 1042   AST 19 08/24/2016 1501   ALT 15 08/18/2017 1042   ALT 16 08/24/2016 1501   BILITOT 0.8 08/18/2017 1042   BILITOT 0.72 08/24/2016 1501       RADIOGRAPHIC STUDIES: Ct Chest W Contrast  Result Date: 08/18/2017 CLINICAL DATA:  History of bladder cancer diagnosed in 2016. Prior left  lung resection. EXAM: CT CHEST, ABDOMEN, AND PELVIS WITH CONTRAST TECHNIQUE: Multidetector CT imaging of the chest, abdomen and pelvis was performed following the standard protocol during bolus administration of intravenous contrast. CONTRAST:  140mL ISOVUE-300 IOPAMIDOL (ISOVUE-300) INJECTION 61% COMPARISON:  Chest CT 05/30/2016; PE CT 04/24/2014; PET-CT 01/28/2014. FINDINGS: CT CHEST FINDINGS Cardiovascular: Left anterior chest wall Port-A-Cath is present with tip terminating in the superior vena cava. Leftward shift of the mediastinum. Heart is mildly enlarged. Coronary arterial and thoracic aortic vascular calcifications. Mediastinum/Nodes: No enlarged axillary, mediastinal or hilar lymphadenopathy. Normal appearance of the esophagus. Lungs/Pleura: Central airways are patent. Centrilobular and paraseptal emphysematous change involving the right lung. Prior left pneumonectomy. New 5 mm nodule medial right lower lobe (image 87; series 4). New adjacent 4 mm medial right lower lobe nodule (image 87; series 4). Musculoskeletal: Thoracic spine degenerative changes. Postsurgical changes multiple left ribs. No aggressive or acute appearing osseous lesions. CT ABDOMEN PELVIS FINDINGS Hepatobiliary: Unchanged cyst left hepatic lobe. Liver is normal  in size and contour. Pancreas: Unremarkable Spleen: Unremarkable Adrenals/Urinary Tract: Normal adrenal glands. Kidneys enhance symmetrically with contrast. Bilateral renal cysts. Additionally, within the superior pole of the left kidney there is an 8 mm too small to characterize low-attenuation lesion (image 74; series 2) and within the inferior pole left kidney there is an 8 mm too small to characterize low-attenuation lesion (image 81; series 2). Both of these lesions demonstrate increased internal density which may be secondary to size of the lesion or intraparenchymal location. Indeterminate 1.4 cm lesion inferior pole right kidney (image 91; series 2). No hydronephrosis.  Patient status post cystectomy. Ileal conduit formation. Stomach/Bowel: No evidence for small bowel obstruction. No free fluid or free intraperitoneal air. Small bowel containing parastomal hernia without evidence for obstruction. Colon is unremarkable. Vascular/Lymphatic: Infrarenal abdominal aortic ectasia measuring 2.8 cm. Peripheral calcified atherosclerotic plaque. No retroperitoneal lymphadenopathy. Reproductive: Status post cystoprostatectomy. Other: Small bowel containing parastomal hernia right anterior abdomen. No evidence for obstruction. Musculoskeletal: Lumbar spine degenerative changes. No aggressive or acute appearing osseous lesions. IMPRESSION: 1. No definite evidence to suggest localized recurrence within the chest. Unchanged postsurgical changes left hemithorax. 2. Two new small nodules within the right lung as described above, potentially infectious/inflammatory in etiology. Recommend attention on follow-up. 3. Interval cystoprostatectomy and ileal conduit formation. No evidence to suggest localized recurrence or metastatic disease within the abdomen or pelvis. 4. Indeterminate 1.4 cm lesion inferior pole right kidney. Recommend dedicated evaluation with pre and post contrast-enhanced abdominal MRI. 5. There are two 8 mm low-attenuation lesions within the left kidney which demonstrate internal density greater than that of fluid. These are incompletely characterized on current exam given lack of precontrast imaging and small size. Recommend attention on above recommended MRI. 6. Small bowel containing parastomal hernia without evidence for obstruction. Electronically Signed   By: Lovey Newcomer M.D.   On: 08/18/2017 15:24   Ct Abdomen Pelvis W Contrast  Result Date: 08/18/2017 CLINICAL DATA:  History of bladder cancer diagnosed in 2016. Prior left lung resection. EXAM: CT CHEST, ABDOMEN, AND PELVIS WITH CONTRAST TECHNIQUE: Multidetector CT imaging of the chest, abdomen and pelvis was performed  following the standard protocol during bolus administration of intravenous contrast. CONTRAST:  11mL ISOVUE-300 IOPAMIDOL (ISOVUE-300) INJECTION 61% COMPARISON:  Chest CT 05/30/2016; PE CT 04/24/2014; PET-CT 01/28/2014. FINDINGS: CT CHEST FINDINGS Cardiovascular: Left anterior chest wall Port-A-Cath is present with tip terminating in the superior vena cava. Leftward shift of the mediastinum. Heart is mildly enlarged. Coronary arterial and thoracic aortic vascular calcifications. Mediastinum/Nodes: No enlarged axillary, mediastinal or hilar lymphadenopathy. Normal appearance of the esophagus. Lungs/Pleura: Central airways are patent. Centrilobular and paraseptal emphysematous change involving the right lung. Prior left pneumonectomy. New 5 mm nodule medial right lower lobe (image 87; series 4). New adjacent 4 mm medial right lower lobe nodule (image 87; series 4). Musculoskeletal: Thoracic spine degenerative changes. Postsurgical changes multiple left ribs. No aggressive or acute appearing osseous lesions. CT ABDOMEN PELVIS FINDINGS Hepatobiliary: Unchanged cyst left hepatic lobe. Liver is normal in size and contour. Pancreas: Unremarkable Spleen: Unremarkable Adrenals/Urinary Tract: Normal adrenal glands. Kidneys enhance symmetrically with contrast. Bilateral renal cysts. Additionally, within the superior pole of the left kidney there is an 8 mm too small to characterize low-attenuation lesion (image 74; series 2) and within the inferior pole left kidney there is an 8 mm too small to characterize low-attenuation lesion (image 81; series 2). Both of these lesions demonstrate increased internal density which may be secondary to size of the  lesion or intraparenchymal location. Indeterminate 1.4 cm lesion inferior pole right kidney (image 91; series 2). No hydronephrosis. Patient status post cystectomy. Ileal conduit formation. Stomach/Bowel: No evidence for small bowel obstruction. No free fluid or free  intraperitoneal air. Small bowel containing parastomal hernia without evidence for obstruction. Colon is unremarkable. Vascular/Lymphatic: Infrarenal abdominal aortic ectasia measuring 2.8 cm. Peripheral calcified atherosclerotic plaque. No retroperitoneal lymphadenopathy. Reproductive: Status post cystoprostatectomy. Other: Small bowel containing parastomal hernia right anterior abdomen. No evidence for obstruction. Musculoskeletal: Lumbar spine degenerative changes. No aggressive or acute appearing osseous lesions. IMPRESSION: 1. No definite evidence to suggest localized recurrence within the chest. Unchanged postsurgical changes left hemithorax. 2. Two new small nodules within the right lung as described above, potentially infectious/inflammatory in etiology. Recommend attention on follow-up. 3. Interval cystoprostatectomy and ileal conduit formation. No evidence to suggest localized recurrence or metastatic disease within the abdomen or pelvis. 4. Indeterminate 1.4 cm lesion inferior pole right kidney. Recommend dedicated evaluation with pre and post contrast-enhanced abdominal MRI. 5. There are two 8 mm low-attenuation lesions within the left kidney which demonstrate internal density greater than that of fluid. These are incompletely characterized on current exam given lack of precontrast imaging and small size. Recommend attention on above recommended MRI. 6. Small bowel containing parastomal hernia without evidence for obstruction. Electronically Signed   By: Lovey Newcomer M.D.   On: 08/18/2017 15:24   ASSESSMENT AND PLAN: This is a very pleasant 72 years old white male with: 1)  recurrent non-small cell lung cancer status post several treatment regimen including neoadjuvant chemotherapy followed by left upper lobectomy followed by systemic chemotherapy as well as maintenance treatment with Avastin and has been observation since June of 2010 and with no evidence for disease recurrence until November 2015  when he was found to have hypermetabolic activity in the left lower lobe.. The patient underwent redo thoracotomy with complete left pneumonectomy The patient is currently on observation and he is feeling fine. Repeat CT scan of the chest, abdomen and pelvis showed no concerning findings for disease recurrence but there was [age]-year-old small pulmonary nodules in the right lung suspicious for inflammatory process.  2) recent diagnosis of bladder cancer: Status post resection at [facility]I recommended for the patient to continue on observation. No concerning findings on the recent CT scan of the abdomen except for 2 low-attenuation lesions in the left kidney as well as the lesion in the inferior pole of the right kidney. I recommended for the patient to discuss these results with his urologist at [facility]. I will arrange for the patient to come back for follow-up visit in 1 year after repeating CT scan of the chest, abdomen and pelvis for restaging of his disease. He was advised to call immediately if he has any concerning symptoms in the interval.  The patient voices understanding of current disease status and treatment options and is in agreement with the current care plan.  All questions were answered. The patient knows to call the clinic with any problems, questions or concerns. We can certainly see the patient much sooner if necessary.  Disclaimer: This note was dictated with voice recognition software. Similar sounding words can inadvertently be transcribed and may be missed upon review.

## 2017-08-22 ENCOUNTER — Ambulatory Visit: Payer: Medicare Other | Admitting: Internal Medicine

## 2017-08-23 ENCOUNTER — Telehealth: Payer: Self-pay | Admitting: Medical Oncology

## 2017-08-23 NOTE — Telephone Encounter (Signed)
[facility] notified with message on triage line.

## 2017-08-23 NOTE — Telephone Encounter (Signed)
I asked Tedra Coupe to push CT scans to [facility] so [facility] can view images. Tedra Coupe will do this.

## 2017-09-13 ENCOUNTER — Inpatient Hospital Stay: Payer: Medicare Other

## 2017-09-13 DIAGNOSIS — C3432 Malignant neoplasm of lower lobe, left bronchus or lung: Secondary | ICD-10-CM

## 2017-09-13 DIAGNOSIS — Z95828 Presence of other vascular implants and grafts: Secondary | ICD-10-CM

## 2017-09-13 DIAGNOSIS — Z85118 Personal history of other malignant neoplasm of bronchus and lung: Secondary | ICD-10-CM | POA: Diagnosis not present

## 2017-09-13 MED ORDER — SODIUM CHLORIDE 0.9 % IJ SOLN
10.0000 mL | INTRAMUSCULAR | Status: DC | PRN
  Administered 2017-09-13: 10 mL via INTRAVENOUS
  Filled 2017-09-13: qty 10

## 2017-09-13 MED ORDER — HEPARIN SOD (PORK) LOCK FLUSH 100 UNIT/ML IV SOLN
500.0000 [IU] | Freq: Once | INTRAVENOUS | Status: AC | PRN
  Administered 2017-09-13: 500 [IU] via INTRAVENOUS
  Filled 2017-09-13: qty 5

## 2017-09-13 NOTE — Patient Instructions (Signed)
Implanted Port Home Guide An implanted port is a type of central line that is placed under the skin. Central lines are used to provide IV access when treatment or nutrition needs to be given through a person's veins. Implanted ports are used for long-term IV access. An implanted port may be placed because:  You need IV medicine that would be irritating to the small veins in your hands or arms.  You need long-term IV medicines, such as antibiotics.  You need IV nutrition for a long period.  You need frequent blood draws for lab tests.  You need dialysis.  Implanted ports are usually placed in the chest area, but they can also be placed in the upper arm, the abdomen, or the leg. An implanted port has two main parts:  Reservoir. The reservoir is round and will appear as a small, raised area under your skin. The reservoir is the part where a needle is inserted to give medicines or draw blood.  Catheter. The catheter is a thin, flexible tube that extends from the reservoir. The catheter is placed into a large vein. Medicine that is inserted into the reservoir goes into the catheter and then into the vein.  How will I care for my incision site? Do not get the incision site wet. Bathe or shower as directed by your health care provider. How is my port accessed? Special steps must be taken to access the port:  Before the port is accessed, a numbing cream can be placed on the skin. This helps numb the skin over the port site.  Your health care provider uses a sterile technique to access the port. ? Your health care provider must put on a mask and sterile gloves. ? The skin over your port is cleaned carefully with an antiseptic and allowed to dry. ? The port is gently pinched between sterile gloves, and a needle is inserted into the port.  Only "non-coring" port needles should be used to access the port. Once the port is accessed, a blood return should be checked. This helps ensure that the port  is in the vein and is not clogged.  If your port needs to remain accessed for a constant infusion, a clear (transparent) bandage will be placed over the needle site. The bandage and needle will need to be changed every week, or as directed by your health care provider.  Keep the bandage covering the needle clean and dry. Do not get it wet. Follow your health care provider's instructions on how to take a shower or bath while the port is accessed.  If your port does not need to stay accessed, no bandage is needed over the port.  What is flushing? Flushing helps keep the port from getting clogged. Follow your health care provider's instructions on how and when to flush the port. Ports are usually flushed with saline solution or a medicine called heparin. The need for flushing will depend on how the port is used.  If the port is used for intermittent medicines or blood draws, the port will need to be flushed: ? After medicines have been given. ? After blood has been drawn. ? As part of routine maintenance.  If a constant infusion is running, the port may not need to be flushed.  How long will my port stay implanted? The port can stay in for as long as your health care provider thinks it is needed. When it is time for the port to come out, surgery will be   done to remove it. The procedure is similar to the one performed when the port was put in. When should I seek immediate medical care? When you have an implanted port, you should seek immediate medical care if:  You notice a bad smell coming from the incision site.  You have swelling, redness, or drainage at the incision site.  You have more swelling or pain at the port site or the surrounding area.  You have a fever that is not controlled with medicine.  This information is not intended to replace advice given to you by your health care provider. Make sure you discuss any questions you have with your health care provider. Document  Released: 01/03/2005 Document Revised: 06/11/2015 Document Reviewed: 09/10/2012 Elsevier Interactive Patient Education  2017 [employer]  

## 2017-11-08 ENCOUNTER — Inpatient Hospital Stay: Payer: Medicare Other | Attending: Internal Medicine

## 2017-11-08 DIAGNOSIS — Z452 Encounter for adjustment and management of vascular access device: Secondary | ICD-10-CM | POA: Insufficient documentation

## 2017-11-08 DIAGNOSIS — Z85118 Personal history of other malignant neoplasm of bronchus and lung: Secondary | ICD-10-CM | POA: Insufficient documentation

## 2017-11-08 DIAGNOSIS — C3432 Malignant neoplasm of lower lobe, left bronchus or lung: Secondary | ICD-10-CM

## 2017-11-08 DIAGNOSIS — Z95828 Presence of other vascular implants and grafts: Secondary | ICD-10-CM

## 2017-11-08 MED ORDER — HEPARIN SOD (PORK) LOCK FLUSH 100 UNIT/ML IV SOLN
500.0000 [IU] | Freq: Once | INTRAVENOUS | Status: AC | PRN
  Administered 2017-11-08: 500 [IU] via INTRAVENOUS
  Filled 2017-11-08: qty 5

## 2017-11-08 MED ORDER — SODIUM CHLORIDE 0.9 % IJ SOLN
10.0000 mL | INTRAMUSCULAR | Status: DC | PRN
  Administered 2017-11-08: 10 mL via INTRAVENOUS
  Filled 2017-11-08: qty 10

## 2018-01-03 ENCOUNTER — Inpatient Hospital Stay: Payer: Medicare Other | Attending: Internal Medicine

## 2018-01-03 DIAGNOSIS — Z85118 Personal history of other malignant neoplasm of bronchus and lung: Secondary | ICD-10-CM | POA: Diagnosis present

## 2018-01-03 DIAGNOSIS — C3432 Malignant neoplasm of lower lobe, left bronchus or lung: Secondary | ICD-10-CM

## 2018-01-03 DIAGNOSIS — Z452 Encounter for adjustment and management of vascular access device: Secondary | ICD-10-CM | POA: Insufficient documentation

## 2018-01-03 DIAGNOSIS — Z95828 Presence of other vascular implants and grafts: Secondary | ICD-10-CM

## 2018-01-03 MED ORDER — HEPARIN SOD (PORK) LOCK FLUSH 100 UNIT/ML IV SOLN
500.0000 [IU] | Freq: Once | INTRAVENOUS | Status: AC | PRN
  Administered 2018-01-03: 500 [IU] via INTRAVENOUS
  Filled 2018-01-03: qty 5

## 2018-01-03 NOTE — Patient Instructions (Signed)

## 2018-02-14 ENCOUNTER — Inpatient Hospital Stay: Payer: Medicare Other | Attending: Internal Medicine

## 2018-02-14 DIAGNOSIS — Z452 Encounter for adjustment and management of vascular access device: Secondary | ICD-10-CM | POA: Diagnosis present

## 2018-02-14 DIAGNOSIS — Z85118 Personal history of other malignant neoplasm of bronchus and lung: Secondary | ICD-10-CM | POA: Diagnosis present

## 2018-02-14 DIAGNOSIS — Z95828 Presence of other vascular implants and grafts: Secondary | ICD-10-CM | POA: Insufficient documentation

## 2018-02-14 DIAGNOSIS — C3432 Malignant neoplasm of lower lobe, left bronchus or lung: Secondary | ICD-10-CM

## 2018-02-14 MED ORDER — SODIUM CHLORIDE 0.9% FLUSH
10.0000 mL | Freq: Once | INTRAVENOUS | Status: AC
  Administered 2018-02-14: 10 mL
  Filled 2018-02-14: qty 10

## 2018-02-14 MED ORDER — HEPARIN SOD (PORK) LOCK FLUSH 100 UNIT/ML IV SOLN
500.0000 [IU] | Freq: Once | INTRAVENOUS | Status: AC
  Administered 2018-02-14: 500 [IU]
  Filled 2018-02-14: qty 5

## 2018-04-11 ENCOUNTER — Inpatient Hospital Stay: Payer: Federal, State, Local not specified - PPO | Attending: Internal Medicine

## 2018-04-11 ENCOUNTER — Other Ambulatory Visit: Payer: Self-pay

## 2018-05-14 ENCOUNTER — Inpatient Hospital Stay: Payer: Medicare Other | Attending: Internal Medicine

## 2018-05-14 ENCOUNTER — Other Ambulatory Visit: Payer: Self-pay

## 2018-05-14 DIAGNOSIS — Z85118 Personal history of other malignant neoplasm of bronchus and lung: Secondary | ICD-10-CM | POA: Diagnosis present

## 2018-05-14 DIAGNOSIS — Z452 Encounter for adjustment and management of vascular access device: Secondary | ICD-10-CM | POA: Diagnosis present

## 2018-05-14 DIAGNOSIS — C3432 Malignant neoplasm of lower lobe, left bronchus or lung: Secondary | ICD-10-CM

## 2018-05-14 DIAGNOSIS — Z95828 Presence of other vascular implants and grafts: Secondary | ICD-10-CM

## 2018-05-14 MED ORDER — SODIUM CHLORIDE 0.9% FLUSH
10.0000 mL | Freq: Once | INTRAVENOUS | Status: AC
  Administered 2018-05-14: 09:00:00 10 mL
  Filled 2018-05-14: qty 10

## 2018-05-14 MED ORDER — HEPARIN SOD (PORK) LOCK FLUSH 100 UNIT/ML IV SOLN
500.0000 [IU] | Freq: Once | INTRAVENOUS | Status: AC
  Administered 2018-05-14: 500 [IU]
  Filled 2018-05-14: qty 5

## 2018-06-06 ENCOUNTER — Other Ambulatory Visit: Payer: Self-pay

## 2018-06-06 ENCOUNTER — Inpatient Hospital Stay: Payer: Medicare Other | Attending: Internal Medicine

## 2018-06-06 DIAGNOSIS — Z95828 Presence of other vascular implants and grafts: Secondary | ICD-10-CM

## 2018-06-06 DIAGNOSIS — C3432 Malignant neoplasm of lower lobe, left bronchus or lung: Secondary | ICD-10-CM

## 2018-06-06 MED ORDER — SODIUM CHLORIDE 0.9% FLUSH
10.0000 mL | Freq: Once | INTRAVENOUS | Status: DC
  Filled 2018-06-06: qty 10

## 2018-06-06 MED ORDER — HEPARIN SOD (PORK) LOCK FLUSH 100 UNIT/ML IV SOLN
500.0000 [IU] | Freq: Once | INTRAVENOUS | Status: DC
  Filled 2018-06-06: qty 5

## 2018-08-17 ENCOUNTER — Inpatient Hospital Stay: Payer: Medicare Other

## 2018-08-17 ENCOUNTER — Other Ambulatory Visit: Payer: Self-pay

## 2018-08-17 ENCOUNTER — Inpatient Hospital Stay: Payer: Medicare Other | Attending: Internal Medicine

## 2018-08-17 ENCOUNTER — Ambulatory Visit (HOSPITAL_COMMUNITY)
Admission: RE | Admit: 2018-08-17 | Discharge: 2018-08-17 | Disposition: A | Discharge status: Home / Self Care | Payer: Medicare Other | Source: Ambulatory Visit | Attending: Internal Medicine | Admitting: Internal Medicine

## 2018-08-17 ENCOUNTER — Encounter (HOSPITAL_COMMUNITY): Payer: Self-pay

## 2018-08-17 DIAGNOSIS — C3490 Malignant neoplasm of unspecified part of unspecified bronchus or lung: Secondary | ICD-10-CM

## 2018-08-17 DIAGNOSIS — Z452 Encounter for adjustment and management of vascular access device: Secondary | ICD-10-CM | POA: Insufficient documentation

## 2018-08-17 DIAGNOSIS — Z95828 Presence of other vascular implants and grafts: Secondary | ICD-10-CM

## 2018-08-17 DIAGNOSIS — C3432 Malignant neoplasm of lower lobe, left bronchus or lung: Secondary | ICD-10-CM

## 2018-08-17 DIAGNOSIS — Z85118 Personal history of other malignant neoplasm of bronchus and lung: Secondary | ICD-10-CM | POA: Diagnosis present

## 2018-08-17 LAB — CMP (CANCER CENTER ONLY)
ALT: 16 U/L (ref 0–44)
AST: 15 U/L (ref 15–41)
Albumin: 3.7 g/dL (ref 3.5–5.0)
Alkaline Phosphatase: 125 U/L (ref 38–126)
Anion gap: 8 (ref 5–15)
BUN: 19 mg/dL (ref 8–23)
CO2: 26 mmol/L (ref 22–32)
Calcium: 9.1 mg/dL (ref 8.9–10.3)
Chloride: 107 mmol/L (ref 98–111)
Creatinine: 1.14 mg/dL (ref 0.61–1.24)
GFR, Est AFR Am: >60 mL/min (ref >60)
GFR, Estimated: >60 mL/min (ref >60)
Glucose, Bld: 108 mg/dL — ABNORMAL HIGH (ref 70–99)
Potassium: 4.1 mmol/L (ref 3.5–5.1)
Sodium: 141 mmol/L (ref 135–145)
Total Bilirubin: 0.6 mg/dL (ref 0.3–1.2)
Total Protein: 6.9 g/dL (ref 6.5–8.1)

## 2018-08-17 LAB — CBC WITH DIFFERENTIAL (CANCER CENTER ONLY)
Abs Immature Granulocytes: 0.02 10*3/uL (ref 0.00–0.07)
Basophils Absolute: 0.1 10*3/uL (ref 0.0–0.1)
Basophils Relative: 1 %
Eosinophils Absolute: 0.2 10*3/uL (ref 0.0–0.5)
Eosinophils Relative: 2 %
HCT: 42.3 % (ref 39.0–52.0)
Hemoglobin: 14.4 g/dL (ref 13.0–17.0)
Immature Granulocytes: 0 %
Lymphocytes Relative: 22 %
Lymphs Abs: 1.7 10*3/uL (ref 0.7–4.0)
MCH: 31.1 pg (ref 26.0–34.0)
MCHC: 34 g/dL (ref 30.0–36.0)
MCV: 91.4 fL (ref 80.0–100.0)
Monocytes Absolute: 0.6 10*3/uL (ref 0.1–1.0)
Monocytes Relative: 8 %
Neutro Abs: 5.1 10*3/uL (ref 1.7–7.7)
Neutrophils Relative %: 67 %
Platelet Count: 169 10*3/uL (ref 150–400)
RBC: 4.63 MIL/uL (ref 4.22–5.81)
RDW: 13.4 % (ref 11.5–15.5)
WBC Count: 7.6 10*3/uL (ref 4.0–10.5)
nRBC: 0 % (ref 0.0–0.2)

## 2018-08-17 MED ORDER — SODIUM CHLORIDE 0.9% FLUSH
10.0000 mL | Freq: Once | INTRAVENOUS | Status: AC
  Administered 2018-08-17: 10 mL
  Filled 2018-08-17: qty 10

## 2018-08-17 MED ORDER — IOHEXOL 300 MG/ML  SOLN
100.0000 mL | Freq: Once | INTRAMUSCULAR | Status: AC | PRN
  Administered 2018-08-17: 100 mL via INTRAVENOUS

## 2018-08-17 MED ORDER — SODIUM CHLORIDE (PF) 0.9 % IJ SOLN
INTRAMUSCULAR | Status: AC
  Filled 2018-08-17: qty 50

## 2018-08-20 ENCOUNTER — Inpatient Hospital Stay: Payer: Medicare Other | Attending: Internal Medicine | Admitting: Internal Medicine

## 2018-08-20 ENCOUNTER — Encounter: Payer: Self-pay | Admitting: Internal Medicine

## 2018-08-20 ENCOUNTER — Other Ambulatory Visit: Payer: Self-pay

## 2018-08-20 VITALS — BP 154/64 | HR 71 | Temp 97.7°F | Resp 18 | Ht 70.0 in | Wt 251.2 lb

## 2018-08-20 DIAGNOSIS — I77811 Abdominal aortic ectasia: Secondary | ICD-10-CM | POA: Diagnosis not present

## 2018-08-20 DIAGNOSIS — K7689 Other specified diseases of liver: Secondary | ICD-10-CM | POA: Diagnosis not present

## 2018-08-20 DIAGNOSIS — R911 Solitary pulmonary nodule: Secondary | ICD-10-CM | POA: Insufficient documentation

## 2018-08-20 DIAGNOSIS — Z9049 Acquired absence of other specified parts of digestive tract: Secondary | ICD-10-CM | POA: Insufficient documentation

## 2018-08-20 DIAGNOSIS — Z885 Allergy status to narcotic agent status: Secondary | ICD-10-CM | POA: Diagnosis not present

## 2018-08-20 DIAGNOSIS — K435 Parastomal hernia without obstruction or  gangrene: Secondary | ICD-10-CM | POA: Insufficient documentation

## 2018-08-20 DIAGNOSIS — Z79899 Other long term (current) drug therapy: Secondary | ICD-10-CM | POA: Diagnosis not present

## 2018-08-20 DIAGNOSIS — R0602 Shortness of breath: Secondary | ICD-10-CM | POA: Insufficient documentation

## 2018-08-20 DIAGNOSIS — C678 Malignant neoplasm of overlapping sites of bladder: Secondary | ICD-10-CM | POA: Diagnosis not present

## 2018-08-20 DIAGNOSIS — N029 Recurrent and persistent hematuria with unspecified morphologic changes: Secondary | ICD-10-CM | POA: Diagnosis not present

## 2018-08-20 DIAGNOSIS — Z85118 Personal history of other malignant neoplasm of bronchus and lung: Secondary | ICD-10-CM | POA: Insufficient documentation

## 2018-08-20 DIAGNOSIS — Z8551 Personal history of malignant neoplasm of bladder: Secondary | ICD-10-CM | POA: Diagnosis present

## 2018-08-20 DIAGNOSIS — C3432 Malignant neoplasm of lower lobe, left bronchus or lung: Secondary | ICD-10-CM

## 2018-08-20 DIAGNOSIS — C3490 Malignant neoplasm of unspecified part of unspecified bronchus or lung: Secondary | ICD-10-CM | POA: Diagnosis not present

## 2018-08-20 DIAGNOSIS — N281 Cyst of kidney, acquired: Secondary | ICD-10-CM | POA: Diagnosis not present

## 2018-08-20 NOTE — Progress Notes (Signed)
[place] [phone]   [phone]  OFFICE PROGRESS NOTE  [name], [address] [place]  [id number]  PRINCIPAL DIAGNOSIS:  1) Recurrent non-small cell lung cancer initially diagnosed as stage IIIA September 2006.  2) diagnosis of early stage bladder cancer in 2014: Status post resection followed by 6 months of intravesical BCG. 3) He has recurrent hematuria. Repeat cystoscopy and biopsy 01/16/2013: CIS, normal RPGs. CT scan abdomen: bilateral renal cysts. No adenopathy. Cystoscopy and biopsy December 2015: Positive cytologies 4) recurrent non-small cell lung cancer, adenocarcinoma involving the left lower lobe diagnosed in November 2015.  PRIOR THERAPY:  1. Status post 3 cycles of neoadjuvant chemotherapy with carboplatin and docetaxel, last dose was given November 22, 2004. 2. Status post left upper lobectomy with lymph node dissection under the care of Dr. Arlyce Dice on January 11, 2005. 3. Status post pericardial window on February 02, 2005 for evacuation of postoperative pericardial tamponade and the fluid was negative for malignancy. 4. Status post 3 cycles of adjuvant chemotherapy with carboplatin and gemcitabine. Last dose was given May 13, 2005. 5. Status post 5 cycles of systemic chemotherapy with carboplatin, paclitaxel and Avastin for disease recurrence. Last dose was given January 04, 2006 and the patient had stable disease by the end of the last cycle. 6. Status post maintenance treatment with Avastin 15 mg/kg given every 3 weeks. The patient is status post 42 cycles, discontinued on July 02, 2008 after the patient had stable disease for more than 2 years. 7. Status post Bilateral selective ureteral cytology, Bilateral retrograde pyelography, Transurethral resection of bladder tumor, Random bladder biopsies, Prostatic urethral biopsy and Transrectal biopsy of the prostate under the care of Dr. Rutherford Limerick  at [facility]. 8. Status post redo thoracotomy with left pneumonectomy under the care of Dr. Lianne Moris at [facility] on 03/05/2014.  CURRENT THERAPY: Observation.  INTERVAL HISTORY: Wesley Harmon 73 y.o. male returns to the clinic today for annual follow-up visit.  The patient is feeling fine today with no concerning complaints except for occasional shortness of breath with exertion.  He denied having any chest pain, cough or hemoptysis.  He denied having any fever or chills.  He has no nausea, vomiting, diarrhea or constipation.  He denied having any headache or visual changes.  He had repeat CT scan of the chest, abdomen pelvis performed recently and he is here for evaluation and discussion of his scan results.  MEDICAL HISTORY: Past Medical History:  Diagnosis Date  . Bladder cancer ([place]) 01/02/13  . GERD (gastroesophageal reflux disease)   . Hyperlipemia   . Hypertension   . lung ca dx'd 07/2004   chemo comp 06/2008  . Lung cancer ([place])   . Neuropathy     ALLERGIES:  is allergic to dilaudid [hydromorphone hcl].  MEDICATIONS:  Current Outpatient Medications  Medication Sig Dispense Refill  . Albuterol Sulfate 108 (90 Base) MCG/ACT AEPB Inhale 2 puffs into the lungs every 6 (six) hours as needed.    Marland Kitchen allopurinol (ZYLOPRIM) 100 MG tablet Take 100 mg by mouth daily as needed.    Marland Kitchen amLODipine (NORVASC) 10 MG tablet Take 10 mg by mouth daily.    Marland Kitchen aspirin EC 81 MG tablet Take 81 mg by mouth.    . Cholecalciferol (VITAMIN D3) 1000 units CAPS Take 1,000 mg by mouth daily.    . cyanocobalamin 1000 MCG tablet Take 1,000 mcg by mouth daily.    Marland Kitchen  FLOVENT HFA 220 MCG/ACT inhaler     . INCRUSE ELLIPTA 62.5 MCG/INH AEPB     . polyethylene glycol (MIRALAX / GLYCOLAX) packet Take 17 g by mouth daily.    Marland Kitchen umeclidinium bromide (INCRUSE ELLIPTA) 62.5 MCG/INH AEPB Inhale 1 puff into the lungs daily.     No current facility-administered medications for this visit.     REVIEW  OF SYSTEMS:  A comprehensive review of systems was negative except for: Respiratory: positive for dyspnea on exertion   PHYSICAL EXAMINATION: General appearance: alert, cooperative and no distress Head: Normocephalic, without obvious abnormality, atraumatic Neck: no adenopathy Lymph nodes: Cervical, supraclavicular, and axillary nodes normal. Resp: Clear to auscultation on the right and absent breath sound on the left Back: symmetric, no curvature. ROM normal. No CVA tenderness. Cardio: regular rate and rhythm, S1, S2 normal, no murmur, click, rub or gallop GI: soft, non-tender; bowel sounds normal; no masses,  no organomegaly Extremities: extremities normal, atraumatic, no cyanosis or edema  ECOG PERFORMANCE STATUS: 1 - Symptomatic but completely ambulatory  Blood pressure (!) 154/64, pulse 71, temperature 97.7 F (36.5 C), temperature source Oral, resp. rate 18, height 5\' 10"  (1.778 m), weight 251 lb 3.2 oz (113.9 kg), SpO2 97 %.  LABORATORY DATA: Lab Results  Component Value Date   WBC 7.6 08/17/2018   HGB 14.4 08/17/2018   HCT 42.3 08/17/2018   MCV 91.4 08/17/2018   PLT 169 08/17/2018      Chemistry      Component Value Date/Time   NA 141 08/17/2018 0951   NA 138 08/24/2016 1501   K 4.1 08/17/2018 0951   K 4.0 08/24/2016 1501   CL 107 08/17/2018 0951   CL 102 09/30/2011 0843   CO2 26 08/17/2018 0951   CO2 26 08/24/2016 1501   BUN 19 08/17/2018 0951   BUN 23.0 08/24/2016 1501   CREATININE 1.14 08/17/2018 0951   CREATININE 1.1 08/24/2016 1501      Component Value Date/Time   CALCIUM 9.1 08/17/2018 0951   CALCIUM 9.3 08/24/2016 1501   ALKPHOS 125 08/17/2018 0951   ALKPHOS 111 08/24/2016 1501   AST 15 08/17/2018 0951   AST 19 08/24/2016 1501   ALT 16 08/17/2018 0951   ALT 16 08/24/2016 1501   BILITOT 0.6 08/17/2018 0951   BILITOT 0.72 08/24/2016 1501       RADIOGRAPHIC STUDIES: Ct Chest W Contrast  Result Date: 08/17/2018 CLINICAL DATA:  Patient with  history of lung cancer diagnosed in 2006. Follow-up exam. EXAM: CT CHEST, ABDOMEN, AND PELVIS WITH CONTRAST TECHNIQUE: Multidetector CT imaging of the chest, abdomen and pelvis was performed following the standard protocol during bolus administration of intravenous contrast. CONTRAST:  16mL OMNIPAQUE IOHEXOL 300 MG/ML  SOLN COMPARISON:  CT CAP 08/18/2017 FINDINGS: CT CHEST FINDINGS Cardiovascular: Left anterior chest wall Port-A-Cath is present with tip terminating in the superior vena cava. Normal heart size. Thoracic aortic vascular calcifications. Mediastinum/Nodes: No enlarged axillary, mediastinal or hilar lymphadenopathy. Leftward shift of the mediastinum. Normal appearance of the esophagus. Lungs/Pleura: Patient status post left pneumonectomy. Centrilobular and paraseptal emphysematous changes involving the right lung. Interval increase in size of right lower lobe nodule measuring 6 mm (image 77; series 7), previously 4 mm. Interval development of a patchy area of architectural distortion within the subpleural right lower lobe measuring 2.0 x 1.4 cm (image 84; series 7). No pleural effusion or pneumothorax. Musculoskeletal: Thoracic spine degenerative changes. No aggressive or acute appearing osseous lesions. Postsurgical changes left ribs. CT  ABDOMEN PELVIS FINDINGS Hepatobiliary: Stable cyst left hepatic lobe. Stable contour of the liver. Prior cholecystectomy. No intrahepatic or extrahepatic biliary ductal dilatation. Pancreas: Unremarkable Spleen: Unremarkable Adrenals/Urinary Tract: Normal adrenal glands. Redemonstrated bilateral renal cyst. Stable subcentimeter too small to characterize low-attenuation lesion within the superior pole of the left kidney (image 73; series 4) and inferior pole of the left kidney (image 81; series 4). Slight interval decrease in size of partially exophytic dense lesion off the inferior pole of the right kidney measuring 11 mm (image 98; series 5), previously 13 mm.  Postsurgical changes compatible with cystectomy. Ileal conduit formation. Stomach/Bowel: Normal morphology of the stomach. No evidence for bowel obstruction. No free fluid or free intraperitoneal air. Vascular/Lymphatic: Infrarenal abdominal aortic ectasia measuring 2.5 cm. Peripheral calcified atherosclerotic plaque involving the abdominal aorta. No retroperitoneal lymphadenopathy. Reproductive: Status post cystoprostatectomy. Other: Small bowel containing parastomal hernia. No evidence for obstruction. Musculoskeletal: Lumbar spine degenerative changes. Lower thoracic spine degenerative changes. No aggressive or acute appearing osseous lesions. IMPRESSION: 1. No definite evidence for metastatic disease within the chest. Stable postsurgical changes left hemithorax. 2. Interval development of a 2.0 cm region of architectural distortion within the subpleural right lower lobe which may be infectious/inflammatory in etiology. Recommend attention on follow-up. 3. Slight interval increase in size of right lower lobe nodule, recommend attention on follow-up. 4. Slight interval decrease in size of indeterminate partially exophytic inferior pole right renal lesion. Recommend continued attention on follow-up. 5. Stable postsurgical changes compatible with cystoprostatectomy and ileal conduit formation. No evidence to suggest localized recurrence or metastatic disease. 6. Small bowel containing parastomal hernia. Electronically Signed   By: Lovey Newcomer M.D.   On: 08/17/2018 13:38   Ct Abdomen Pelvis W Contrast  Result Date: 08/17/2018 CLINICAL DATA:  Patient with history of lung cancer diagnosed in 2006. Follow-up exam. EXAM: CT CHEST, ABDOMEN, AND PELVIS WITH CONTRAST TECHNIQUE: Multidetector CT imaging of the chest, abdomen and pelvis was performed following the standard protocol during bolus administration of intravenous contrast. CONTRAST:  186mL OMNIPAQUE IOHEXOL 300 MG/ML  SOLN COMPARISON:  CT CAP 08/18/2017  FINDINGS: CT CHEST FINDINGS Cardiovascular: Left anterior chest wall Port-A-Cath is present with tip terminating in the superior vena cava. Normal heart size. Thoracic aortic vascular calcifications. Mediastinum/Nodes: No enlarged axillary, mediastinal or hilar lymphadenopathy. Leftward shift of the mediastinum. Normal appearance of the esophagus. Lungs/Pleura: Patient status post left pneumonectomy. Centrilobular and paraseptal emphysematous changes involving the right lung. Interval increase in size of right lower lobe nodule measuring 6 mm (image 77; series 7), previously 4 mm. Interval development of a patchy area of architectural distortion within the subpleural right lower lobe measuring 2.0 x 1.4 cm (image 84; series 7). No pleural effusion or pneumothorax. Musculoskeletal: Thoracic spine degenerative changes. No aggressive or acute appearing osseous lesions. Postsurgical changes left ribs. CT ABDOMEN PELVIS FINDINGS Hepatobiliary: Stable cyst left hepatic lobe. Stable contour of the liver. Prior cholecystectomy. No intrahepatic or extrahepatic biliary ductal dilatation. Pancreas: Unremarkable Spleen: Unremarkable Adrenals/Urinary Tract: Normal adrenal glands. Redemonstrated bilateral renal cyst. Stable subcentimeter too small to characterize low-attenuation lesion within the superior pole of the left kidney (image 73; series 4) and inferior pole of the left kidney (image 81; series 4). Slight interval decrease in size of partially exophytic dense lesion off the inferior pole of the right kidney measuring 11 mm (image 98; series 5), previously 13 mm. Postsurgical changes compatible with cystectomy. Ileal conduit formation. Stomach/Bowel: Normal morphology of the stomach. No evidence for bowel obstruction. No  free fluid or free intraperitoneal air. Vascular/Lymphatic: Infrarenal abdominal aortic ectasia measuring 2.5 cm. Peripheral calcified atherosclerotic plaque involving the abdominal aorta. No  retroperitoneal lymphadenopathy. Reproductive: Status post cystoprostatectomy. Other: Small bowel containing parastomal hernia. No evidence for obstruction. Musculoskeletal: Lumbar spine degenerative changes. Lower thoracic spine degenerative changes. No aggressive or acute appearing osseous lesions. IMPRESSION: 1. No definite evidence for metastatic disease within the chest. Stable postsurgical changes left hemithorax. 2. Interval development of a 2.0 cm region of architectural distortion within the subpleural right lower lobe which may be infectious/inflammatory in etiology. Recommend attention on follow-up. 3. Slight interval increase in size of right lower lobe nodule, recommend attention on follow-up. 4. Slight interval decrease in size of indeterminate partially exophytic inferior pole right renal lesion. Recommend continued attention on follow-up. 5. Stable postsurgical changes compatible with cystoprostatectomy and ileal conduit formation. No evidence to suggest localized recurrence or metastatic disease. 6. Small bowel containing parastomal hernia. Electronically Signed   By: Lovey Newcomer M.D.   On: 08/17/2018 13:38   ASSESSMENT AND PLAN: This is a very pleasant 73 years old white male with: 1)  recurrent non-small cell lung cancer status post several treatment regimen including neoadjuvant chemotherapy followed by left upper lobectomy followed by systemic chemotherapy as well as maintenance treatment with Avastin and has been observation since June of 2010 and with no evidence for disease recurrence until November 2015 when he was found to have hypermetabolic activity in the left lower lobe.. The patient underwent redo thoracotomy with complete left pneumonectomy The patient has no complaints today except for shortness of breath with exertion. He had repeat CT scan of the chest, abdomen pelvis performed recently.  I personally and independently reviewed the scans and discussed the results with the  patient today. He has a scan showed no concerning findings for disease progression except for a slightly increased right lower lobe pulmonary nodule that currently measures 6 mm. I recommended for the patient to continue on observation with repeat CT scan of the chest, abdomen pelvis in 1 year. He was advised to call immediately if he has any concerning symptoms in the interval.  2) recent diagnosis of bladder cancer: Status post resection at [facility].I recommended for the patient to continue on observation. He will also continue to have Port-A-Cath flush every 2 months.  The patient voices understanding of current disease status and treatment options and is in agreement with the current care plan.  All questions were answered. The patient knows to call the clinic with any problems, questions or concerns. We can certainly see the patient much sooner if necessary.  Disclaimer: This note was dictated with voice recognition software. Similar sounding words can inadvertently be transcribed and may be missed upon review.

## 2018-08-21 ENCOUNTER — Telehealth: Payer: Self-pay | Admitting: Internal Medicine

## 2018-08-21 NOTE — Telephone Encounter (Signed)
Scheduled appt per 8/03 los - mailed letter with appt date and time

## 2018-08-27 ENCOUNTER — Telehealth: Payer: Self-pay | Admitting: Internal Medicine

## 2018-08-27 NOTE — Telephone Encounter (Signed)
Returned patient's phone call regarding rescheduling a port flush appointment in October, appointment has been changed.

## 2018-10-19 ENCOUNTER — Telehealth: Payer: Self-pay | Admitting: Internal Medicine

## 2018-10-19 ENCOUNTER — Inpatient Hospital Stay: Payer: Medicare Other

## 2018-10-19 NOTE — Telephone Encounter (Signed)
Returned patient's phone call regarding rescheduling missed 10/02 appointment, per patient's request appointment moved to 10/16.

## 2018-10-19 NOTE — Telephone Encounter (Signed)
Returned patient's phone call regarding rescheduling an appointment, left a voicemail. 

## 2018-11-02 ENCOUNTER — Other Ambulatory Visit: Payer: Self-pay

## 2018-11-02 ENCOUNTER — Inpatient Hospital Stay: Payer: Medicare Other | Attending: Internal Medicine

## 2018-11-02 DIAGNOSIS — C679 Malignant neoplasm of bladder, unspecified: Secondary | ICD-10-CM | POA: Diagnosis not present

## 2018-11-02 DIAGNOSIS — R0602 Shortness of breath: Secondary | ICD-10-CM | POA: Insufficient documentation

## 2018-11-02 DIAGNOSIS — Z95828 Presence of other vascular implants and grafts: Secondary | ICD-10-CM

## 2018-11-02 DIAGNOSIS — Z452 Encounter for adjustment and management of vascular access device: Secondary | ICD-10-CM | POA: Diagnosis not present

## 2018-11-02 DIAGNOSIS — Z8551 Personal history of malignant neoplasm of bladder: Secondary | ICD-10-CM | POA: Diagnosis not present

## 2018-11-02 DIAGNOSIS — Z85118 Personal history of other malignant neoplasm of bronchus and lung: Secondary | ICD-10-CM | POA: Diagnosis not present

## 2018-11-02 DIAGNOSIS — C3432 Malignant neoplasm of lower lobe, left bronchus or lung: Secondary | ICD-10-CM | POA: Diagnosis present

## 2018-11-02 MED ORDER — SODIUM CHLORIDE 0.9% FLUSH
10.0000 mL | Freq: Once | INTRAVENOUS | Status: AC
  Administered 2018-11-02: 10 mL
  Filled 2018-11-02: qty 10

## 2018-11-02 MED ORDER — HEPARIN SOD (PORK) LOCK FLUSH 100 UNIT/ML IV SOLN
500.0000 [IU] | Freq: Once | INTRAVENOUS | Status: AC
  Administered 2018-11-02: 500 [IU]
  Filled 2018-11-02: qty 5

## 2018-11-02 NOTE — Patient Instructions (Signed)

## 2018-12-21 ENCOUNTER — Other Ambulatory Visit: Payer: Self-pay

## 2018-12-21 ENCOUNTER — Inpatient Hospital Stay: Payer: Medicare Other | Attending: Internal Medicine

## 2018-12-21 DIAGNOSIS — Z79899 Other long term (current) drug therapy: Secondary | ICD-10-CM | POA: Insufficient documentation

## 2018-12-21 DIAGNOSIS — C679 Malignant neoplasm of bladder, unspecified: Secondary | ICD-10-CM | POA: Insufficient documentation

## 2018-12-21 DIAGNOSIS — C3432 Malignant neoplasm of lower lobe, left bronchus or lung: Secondary | ICD-10-CM | POA: Diagnosis present

## 2018-12-21 DIAGNOSIS — Z452 Encounter for adjustment and management of vascular access device: Secondary | ICD-10-CM | POA: Diagnosis not present

## 2018-12-21 DIAGNOSIS — Z95828 Presence of other vascular implants and grafts: Secondary | ICD-10-CM

## 2018-12-21 DIAGNOSIS — R319 Hematuria, unspecified: Secondary | ICD-10-CM | POA: Insufficient documentation

## 2018-12-21 MED ORDER — SODIUM CHLORIDE 0.9% FLUSH
10.0000 mL | Freq: Once | INTRAVENOUS | Status: AC
  Administered 2018-12-21: 14:00:00 10 mL
  Filled 2018-12-21: qty 10

## 2018-12-21 MED ORDER — HEPARIN SOD (PORK) LOCK FLUSH 100 UNIT/ML IV SOLN
500.0000 [IU] | Freq: Once | INTRAVENOUS | Status: AC
  Administered 2018-12-21: 500 [IU]
  Filled 2018-12-21: qty 5

## 2019-01-09 ENCOUNTER — Telehealth: Payer: Self-pay | Admitting: Internal Medicine

## 2019-01-09 NOTE — Telephone Encounter (Signed)
Returned phone call regarding rescheduling 02/05 appointment, per patient's request appointment has moved to 02/06.

## 2019-02-08 ENCOUNTER — Telehealth: Payer: Self-pay | Admitting: Medical Oncology

## 2019-02-08 NOTE — Telephone Encounter (Signed)
Confirmed aug appts.

## 2019-02-22 ENCOUNTER — Other Ambulatory Visit: Payer: Self-pay

## 2019-02-22 ENCOUNTER — Inpatient Hospital Stay: Payer: Medicare Other | Attending: Internal Medicine

## 2019-02-22 DIAGNOSIS — C3432 Malignant neoplasm of lower lobe, left bronchus or lung: Secondary | ICD-10-CM

## 2019-02-22 DIAGNOSIS — Z95828 Presence of other vascular implants and grafts: Secondary | ICD-10-CM

## 2019-02-22 MED ORDER — HEPARIN SOD (PORK) LOCK FLUSH 100 UNIT/ML IV SOLN
500.0000 [IU] | Freq: Once | INTRAVENOUS | Status: AC
  Administered 2019-02-22: 500 [IU]
  Filled 2019-02-22: qty 5

## 2019-02-22 MED ORDER — SODIUM CHLORIDE 0.9% FLUSH
10.0000 mL | Freq: Once | INTRAVENOUS | Status: AC
  Administered 2019-02-22: 13:00:00 10 mL
  Filled 2019-02-22: qty 10

## 2019-04-22 ENCOUNTER — Inpatient Hospital Stay: Payer: Medicare Other | Attending: Internal Medicine

## 2019-04-22 ENCOUNTER — Other Ambulatory Visit: Payer: Self-pay

## 2019-04-22 DIAGNOSIS — Z95828 Presence of other vascular implants and grafts: Secondary | ICD-10-CM

## 2019-04-22 DIAGNOSIS — C3432 Malignant neoplasm of lower lobe, left bronchus or lung: Secondary | ICD-10-CM | POA: Diagnosis present

## 2019-04-22 MED ORDER — HEPARIN SOD (PORK) LOCK FLUSH 100 UNIT/ML IV SOLN
500.0000 [IU] | Freq: Once | INTRAVENOUS | Status: AC
  Administered 2019-04-22: 500 [IU]
  Filled 2019-04-22: qty 5

## 2019-04-22 MED ORDER — SODIUM CHLORIDE 0.9% FLUSH
10.0000 mL | Freq: Once | INTRAVENOUS | Status: AC
  Administered 2019-04-22: 10 mL
  Filled 2019-04-22: qty 10

## 2019-06-21 ENCOUNTER — Other Ambulatory Visit: Payer: Self-pay

## 2019-06-21 ENCOUNTER — Inpatient Hospital Stay: Payer: Medicare Other | Attending: Internal Medicine

## 2019-08-19 ENCOUNTER — Other Ambulatory Visit: Payer: Self-pay

## 2019-08-19 ENCOUNTER — Inpatient Hospital Stay: Payer: Medicare Other | Attending: Internal Medicine

## 2019-08-19 ENCOUNTER — Inpatient Hospital Stay: Payer: Medicare Other

## 2019-08-19 ENCOUNTER — Encounter (HOSPITAL_COMMUNITY): Payer: Self-pay

## 2019-08-19 ENCOUNTER — Ambulatory Visit (HOSPITAL_COMMUNITY)
Admission: RE | Admit: 2019-08-19 | Discharge: 2019-08-19 | Disposition: A | Discharge status: Home / Self Care | Payer: Medicare Other | Source: Ambulatory Visit | Attending: Internal Medicine | Admitting: Internal Medicine

## 2019-08-19 DIAGNOSIS — N029 Recurrent and persistent hematuria with unspecified morphologic changes: Secondary | ICD-10-CM | POA: Diagnosis not present

## 2019-08-19 DIAGNOSIS — Z9049 Acquired absence of other specified parts of digestive tract: Secondary | ICD-10-CM | POA: Diagnosis not present

## 2019-08-19 DIAGNOSIS — M549 Dorsalgia, unspecified: Secondary | ICD-10-CM | POA: Insufficient documentation

## 2019-08-19 DIAGNOSIS — Z885 Allergy status to narcotic agent status: Secondary | ICD-10-CM | POA: Diagnosis not present

## 2019-08-19 DIAGNOSIS — Z85118 Personal history of other malignant neoplasm of bronchus and lung: Secondary | ICD-10-CM | POA: Diagnosis not present

## 2019-08-19 DIAGNOSIS — C3490 Malignant neoplasm of unspecified part of unspecified bronchus or lung: Secondary | ICD-10-CM

## 2019-08-19 DIAGNOSIS — Z9079 Acquired absence of other genital organ(s): Secondary | ICD-10-CM | POA: Diagnosis not present

## 2019-08-19 DIAGNOSIS — C679 Malignant neoplasm of bladder, unspecified: Secondary | ICD-10-CM | POA: Diagnosis not present

## 2019-08-19 DIAGNOSIS — I700 Atherosclerosis of aorta: Secondary | ICD-10-CM | POA: Insufficient documentation

## 2019-08-19 DIAGNOSIS — Z936 Other artificial openings of urinary tract status: Secondary | ICD-10-CM | POA: Insufficient documentation

## 2019-08-19 DIAGNOSIS — C3432 Malignant neoplasm of lower lobe, left bronchus or lung: Secondary | ICD-10-CM | POA: Insufficient documentation

## 2019-08-19 DIAGNOSIS — N281 Cyst of kidney, acquired: Secondary | ICD-10-CM | POA: Insufficient documentation

## 2019-08-19 DIAGNOSIS — I2510 Atherosclerotic heart disease of native coronary artery without angina pectoris: Secondary | ICD-10-CM | POA: Diagnosis not present

## 2019-08-19 DIAGNOSIS — N1330 Unspecified hydronephrosis: Secondary | ICD-10-CM | POA: Diagnosis not present

## 2019-08-19 DIAGNOSIS — Z79899 Other long term (current) drug therapy: Secondary | ICD-10-CM | POA: Insufficient documentation

## 2019-08-19 DIAGNOSIS — J439 Emphysema, unspecified: Secondary | ICD-10-CM | POA: Insufficient documentation

## 2019-08-19 DIAGNOSIS — K435 Parastomal hernia without obstruction or  gangrene: Secondary | ICD-10-CM | POA: Diagnosis not present

## 2019-08-19 DIAGNOSIS — Z95828 Presence of other vascular implants and grafts: Secondary | ICD-10-CM

## 2019-08-19 LAB — CBC WITH DIFFERENTIAL (CANCER CENTER ONLY)
Abs Immature Granulocytes: 0.02 10*3/uL (ref 0.00–0.07)
Basophils Absolute: 0.1 10*3/uL (ref 0.0–0.1)
Basophils Relative: 1 %
Eosinophils Absolute: 0.2 10*3/uL (ref 0.0–0.5)
Eosinophils Relative: 3 %
HCT: 37.9 % — ABNORMAL LOW (ref 39.0–52.0)
Hemoglobin: 12.3 g/dL — ABNORMAL LOW (ref 13.0–17.0)
Immature Granulocytes: 0 %
Lymphocytes Relative: 20 %
Lymphs Abs: 1.4 10*3/uL (ref 0.7–4.0)
MCH: 30.5 pg (ref 26.0–34.0)
MCHC: 32.5 g/dL (ref 30.0–36.0)
MCV: 94 fL (ref 80.0–100.0)
Monocytes Absolute: 0.7 10*3/uL (ref 0.1–1.0)
Monocytes Relative: 9 %
Neutro Abs: 4.8 10*3/uL (ref 1.7–7.7)
Neutrophils Relative %: 67 %
Platelet Count: 185 10*3/uL (ref 150–400)
RBC: 4.03 MIL/uL — ABNORMAL LOW (ref 4.22–5.81)
RDW: 13.2 % (ref 11.5–15.5)
WBC Count: 7.2 10*3/uL (ref 4.0–10.5)
nRBC: 0 % (ref 0.0–0.2)

## 2019-08-19 LAB — CMP (CANCER CENTER ONLY)
ALT: 11 U/L (ref 0–44)
AST: 15 U/L (ref 15–41)
Albumin: 3.4 g/dL — ABNORMAL LOW (ref 3.5–5.0)
Alkaline Phosphatase: 95 U/L (ref 38–126)
Anion gap: 8 (ref 5–15)
BUN: 27 mg/dL — ABNORMAL HIGH (ref 8–23)
CO2: 24 mmol/L (ref 22–32)
Calcium: 10 mg/dL (ref 8.9–10.3)
Chloride: 109 mmol/L (ref 98–111)
Creatinine: 1.41 mg/dL — ABNORMAL HIGH (ref 0.61–1.24)
GFR, Est AFR Am: 56 mL/min — ABNORMAL LOW (ref >60)
GFR, Estimated: 48 mL/min — ABNORMAL LOW (ref >60)
Glucose, Bld: 112 mg/dL — ABNORMAL HIGH (ref 70–99)
Potassium: 4.8 mmol/L (ref 3.5–5.1)
Sodium: 141 mmol/L (ref 135–145)
Total Bilirubin: 0.4 mg/dL (ref 0.3–1.2)
Total Protein: 6.9 g/dL (ref 6.5–8.1)

## 2019-08-19 MED ORDER — SODIUM CHLORIDE (PF) 0.9 % IJ SOLN
INTRAMUSCULAR | Status: AC
  Filled 2019-08-19: qty 50

## 2019-08-19 MED ORDER — SODIUM CHLORIDE 0.9 % IV SOLN
INTRAVENOUS | Status: AC
  Filled 2019-08-19: qty 250

## 2019-08-19 MED ORDER — IOHEXOL 300 MG/ML  SOLN
100.0000 mL | Freq: Once | INTRAMUSCULAR | Status: AC | PRN
  Administered 2019-08-19: 100 mL via INTRAVENOUS

## 2019-08-19 MED ORDER — IOHEXOL 9 MG/ML PO SOLN
ORAL | Status: AC
  Filled 2019-08-19: qty 1000

## 2019-08-19 MED ORDER — SODIUM CHLORIDE 0.9% FLUSH
10.0000 mL | Freq: Once | INTRAVENOUS | Status: AC
  Administered 2019-08-19: 10 mL
  Filled 2019-08-19: qty 10

## 2019-08-19 MED ORDER — HEPARIN SOD (PORK) LOCK FLUSH 100 UNIT/ML IV SOLN
INTRAVENOUS | Status: AC
  Filled 2019-08-19: qty 5

## 2019-08-19 MED ORDER — HEPARIN SOD (PORK) LOCK FLUSH 100 UNIT/ML IV SOLN
500.0000 [IU] | Freq: Once | INTRAVENOUS | Status: AC
  Administered 2019-08-19: 500 [IU]

## 2019-08-19 NOTE — Progress Notes (Signed)
Pt left accessed for CT .

## 2019-08-20 ENCOUNTER — Telehealth: Payer: Self-pay | Admitting: Medical Oncology

## 2019-08-20 NOTE — Telephone Encounter (Signed)
Called imaging report given to [facility]. Pt has appt 8/4.

## 2019-08-21 ENCOUNTER — Encounter: Payer: Self-pay | Admitting: Internal Medicine

## 2019-08-21 ENCOUNTER — Inpatient Hospital Stay (HOSPITAL_BASED_OUTPATIENT_CLINIC_OR_DEPARTMENT_OTHER): Payer: Medicare Other | Admitting: Internal Medicine

## 2019-08-21 ENCOUNTER — Other Ambulatory Visit: Payer: Self-pay

## 2019-08-21 ENCOUNTER — Telehealth: Payer: Self-pay | Admitting: Internal Medicine

## 2019-08-21 DIAGNOSIS — C3490 Malignant neoplasm of unspecified part of unspecified bronchus or lung: Secondary | ICD-10-CM | POA: Diagnosis not present

## 2019-08-21 DIAGNOSIS — C3432 Malignant neoplasm of lower lobe, left bronchus or lung: Secondary | ICD-10-CM | POA: Diagnosis not present

## 2019-08-21 NOTE — Progress Notes (Signed)
[place] [phone]   Fax:([phone]) [place], [place] [address] [place] [address]  PRINCIPAL DIAGNOSIS:  1) new right lower lobe pulmonary nodule suspicious for metachronous lung cancer. 2) Recurrent non-small cell lung cancer initially diagnosed as stage IIIA September 2006.  2) diagnosis of early stage bladder cancer in 2014: Status post resection followed by 6 months of intravesical BCG. 3) He has recurrent hematuria. Repeat cystoscopy and biopsy 01/16/2013: CIS, normal RPGs. CT scan abdomen: bilateral renal cysts. No adenopathy. Cystoscopy and biopsy December 2015: Positive cytologies 4) recurrent non-small cell lung cancer, adenocarcinoma involving the left lower lobe diagnosed in November 2015.  PRIOR THERAPY:  1. Status post 3 cycles of neoadjuvant chemotherapy with carboplatin and docetaxel, last dose was given November 22, 2004. 2. Status post left upper lobectomy with lymph node dissection under the care of Dr. Arlyce Dice on January 11, 2005. 3. Status post pericardial window on February 02, 2005 for evacuation of postoperative pericardial tamponade and the fluid was negative for malignancy. 4. Status post 3 cycles of adjuvant chemotherapy with carboplatin and gemcitabine. Last dose was given May 13, 2005. 5. Status post 5 cycles of systemic chemotherapy with carboplatin, paclitaxel and Avastin for disease recurrence. Last dose was given January 04, 2006 and the patient had stable disease by the end of the last cycle. 6. Status post maintenance treatment with Avastin 15 mg/kg given every 3 weeks. The patient is status post 42 cycles, discontinued on July 02, 2008 after the patient had stable disease for more than 2 years. 7. Status post Bilateral selective ureteral cytology, Bilateral retrograde pyelography, Transurethral resection of bladder tumor, Random bladder biopsies, Prostatic urethral  biopsy and Transrectal biopsy of the prostate under the care of Dr. Rutherford Limerick at [facility]. 8. Status post redo thoracotomy with left pneumonectomy under the care of Dr. Lianne Moris at [facility] on 03/05/2014.  CURRENT THERAPY: Observation.  INTERVAL HISTORY: Wesley Harmon 74 y.o. male returns to the clinic today for follow-up visit.  The patient is feeling fine today with no concerning complaints except for intermittent pain on the left side of the back.  He denied having any current chest pain, shortness of breath, cough or hemoptysis.  He denied having any fever or chills.  He has no nausea, vomiting, diarrhea or constipation.  He has no headache or visual changes.  He had repeat CT scan of the chest, abdomen pelvis performed recently and he is here for evaluation and discussion of his discuss results.  MEDICAL HISTORY: Past Medical History:  Diagnosis Date  . Bladder cancer ([place]) 01/02/13  . GERD (gastroesophageal reflux disease)   . Hyperlipemia   . Hypertension   . lung ca dx'd 07/2004   chemo comp 06/2008  . Lung cancer ([place])   . Neuropathy     ALLERGIES:  is allergic to dilaudid [hydromorphone hcl].  MEDICATIONS:  Current Outpatient Medications  Medication Sig Dispense Refill  . Albuterol Sulfate 108 (90 Base) MCG/ACT AEPB Inhale 2 puffs into the lungs every 6 (six) hours as needed.    Marland Kitchen allopurinol (ZYLOPRIM) 100 MG tablet Take 100 mg by mouth daily as needed.    Marland Kitchen amLODipine (NORVASC) 10 MG tablet Take 10 mg by mouth daily.    Marland Kitchen aspirin EC 81 MG tablet Take 81 mg by mouth.    . Cholecalciferol (VITAMIN D3) 1000 units CAPS Take 1,000 mg by mouth  daily.    . cyanocobalamin 1000 MCG tablet Take 1,000 mcg by mouth daily.    Marland Kitchen FLOVENT HFA 220 MCG/ACT inhaler     . INCRUSE ELLIPTA 62.5 MCG/INH AEPB     . polyethylene glycol (MIRALAX / GLYCOLAX) packet Take 17 g by mouth daily.    Marland Kitchen umeclidinium bromide (INCRUSE ELLIPTA) 62.5 MCG/INH AEPB Inhale 1 puff into  the lungs daily.     No current facility-administered medications for this visit.    REVIEW OF SYSTEMS:  Constitutional: negative Eyes: negative Ears, nose, mouth, throat, and face: negative Respiratory: negative Cardiovascular: negative Gastrointestinal: negative Genitourinary:negative Integument/breast: negative Hematologic/lymphatic: negative Musculoskeletal:positive for back pain Neurological: negative Behavioral/Psych: negative Endocrine: negative Allergic/Immunologic: negative   PHYSICAL EXAMINATION: General appearance: alert, cooperative and no distress Head: Normocephalic, without obvious abnormality, atraumatic Neck: no adenopathy Lymph nodes: Cervical, supraclavicular, and axillary nodes normal. Resp: Clear to auscultation on the right and absent breath sound on the left Back: symmetric, no curvature. ROM normal. No CVA tenderness. Cardio: regular rate and rhythm, S1, S2 normal, no murmur, click, rub or gallop GI: soft, non-tender; bowel sounds normal; no masses,  no organomegaly Extremities: extremities normal, atraumatic, no cyanosis or edema Neurologic: Alert and oriented X 3, normal strength and tone. Normal symmetric reflexes. Normal coordination and gait  ECOG PERFORMANCE STATUS: 1 - Symptomatic but completely ambulatory  Blood pressure (!) 123/55, pulse (!) 58, temperature (!) 97 F (36.1 C), temperature source Temporal, resp. rate 18, height 5\' 10"  (1.778 m), weight 247 lb 3.2 oz (112.1 kg), SpO2 100 %.  LABORATORY DATA: Lab Results  Component Value Date   WBC 7.2 08/19/2019   HGB 12.3 (L) 08/19/2019   HCT 37.9 (L) 08/19/2019   MCV 94.0 08/19/2019   PLT 185 08/19/2019      Chemistry      Component Value Date/Time   NA 141 08/19/2019 1013   NA 138 08/24/2016 1501   K 4.8 08/19/2019 1013   K 4.0 08/24/2016 1501   CL 109 08/19/2019 1013   CL 102 09/30/2011 0843   CO2 24 08/19/2019 1013   CO2 26 08/24/2016 1501   BUN 27 (H) 08/19/2019 1013   BUN  23.0 08/24/2016 1501   CREATININE 1.41 (H) 08/19/2019 1013   CREATININE 1.1 08/24/2016 1501      Component Value Date/Time   CALCIUM 10.0 08/19/2019 1013   CALCIUM 9.3 08/24/2016 1501   ALKPHOS 95 08/19/2019 1013   ALKPHOS 111 08/24/2016 1501   AST 15 08/19/2019 1013   AST 19 08/24/2016 1501   ALT 11 08/19/2019 1013   ALT 16 08/24/2016 1501   BILITOT 0.4 08/19/2019 1013   BILITOT 0.72 08/24/2016 1501       RADIOGRAPHIC STUDIES: CT Chest W Contrast  Result Date: 08/19/2019 CLINICAL DATA:  Non-small cell lung cancer staging, status post left pneumonectomy, bladder cancer, status post cysto prostatectomy and ileal conduit urinary diversion EXAM: CT CHEST, ABDOMEN, AND PELVIS WITH CONTRAST TECHNIQUE: Multidetector CT imaging of the chest, abdomen and pelvis was performed following the standard protocol during bolus administration of intravenous contrast. CONTRAST:  181mL OMNIPAQUE IOHEXOL 300 MG/ML SOLN, additional oral enteric contrast COMPARISON:  08/17/2018 FINDINGS: CT CHEST FINDINGS Cardiovascular: Left chest port catheter. Leftward shift of the mediastinal contents status post pneumonectomy. Aortic atherosclerosis. Normal heart size. Scattered three-vessel coronary artery calcifications. No pericardial effusion. Mediastinum/Nodes: No enlarged mediastinal, hilar, or axillary lymph nodes. Thyroid gland, trachea, and esophagus demonstrate no significant findings. Lungs/Pleura: Redemonstrated postoperative findings of left  pneumonectomy with a small volume of chronic, loculated fluid in the left hemithorax. Moderate to severe centrilobular emphysema of the remaining right lung. Interval increase in size of a spiculated nodule of the superior segment right lower lobe, measuring 1.5 x 1.1 cm, previously 0.7 cm when measured similarly (series 7, image 64). Musculoskeletal: No chest wall mass or suspicious bone lesions identified. CT ABDOMEN PELVIS FINDINGS Hepatobiliary: No solid liver abnormality is  seen. Fluid attenuation cysts of the left lobe. Status post cholecystectomy. Postoperative biliary ductal dilatation. Pancreas: Unremarkable. No pancreatic ductal dilatation or surrounding inflammatory changes. Spleen: Normal in size without significant abnormality. Adrenals/Urinary Tract: Adrenal glands are unremarkable. Exophytic cysts of the kidney. Status post cystoprostatectomy with ileal conduit urinary diversion. There is new, moderate right hydronephrosis with an apparent soft tissue attenuation no left-sided hydronephrosis. Filling defect in the mid right ureter proximal to the ileal conduit (series 3, image 64). Stomach/Bowel: Stomach is within normal limits. Appendix appears normal. No evidence of bowel wall thickening, distention, or inflammatory changes. Vascular/Lymphatic: Aortic atherosclerosis. No enlarged abdominal or pelvic lymph nodes. Reproductive: No mass or other abnormality. Other: There is a large right hemiabdominal parastomal hernia containing multiple nonobstructed loops of small bowel. No abdominopelvic ascites. Musculoskeletal: No acute or significant osseous findings. IMPRESSION: 1. Interval increase in size of a spiculated nodule of the superior segment right lower lobe, measuring 1.5 x 1.1 cm, previously 0.7 cm when measured similarly. This is concerning for metachronous primary lung malignancy or metastasis. 2. Redemonstrated postoperative findings of left pneumonectomy with a small volume of chronic, loculated fluid in the left hemithorax. Leftward shift of the mediastinal contents status post pneumonectomy. 3. No specific evidence of metastatic disease in the abdomen or pelvis. 4. There is new, moderate right hydronephrosis with an apparent soft tissue attenuation filling defect of the mid right ureter, possibly reflecting clot material or obstructing soft tissue, generally concerning for malignancy. No left-sided hydronephrosis. 5. Status post cystoprostatectomy with a  redemonstrated large right hemiabdominal parastomal hernia containing multiple loops of nonobstructed small bowel. 6. Emphysema (ICD10-J43.9). 7. Coronary artery disease. Aortic Atherosclerosis (ICD10-I70.0). These results will be called to the ordering clinician or representative by the Radiologist Assistant, and communication documented in the PACS or [employer]. Electronically Signed   By: Eddie Candle M.D.   On: 08/19/2019 17:02   CT Abdomen Pelvis W Contrast  Result Date: 08/19/2019 CLINICAL DATA:  Non-small cell lung cancer staging, status post left pneumonectomy, bladder cancer, status post cysto prostatectomy and ileal conduit urinary diversion EXAM: CT CHEST, ABDOMEN, AND PELVIS WITH CONTRAST TECHNIQUE: Multidetector CT imaging of the chest, abdomen and pelvis was performed following the standard protocol during bolus administration of intravenous contrast. CONTRAST:  180mL OMNIPAQUE IOHEXOL 300 MG/ML SOLN, additional oral enteric contrast COMPARISON:  08/17/2018 FINDINGS: CT CHEST FINDINGS Cardiovascular: Left chest port catheter. Leftward shift of the mediastinal contents status post pneumonectomy. Aortic atherosclerosis. Normal heart size. Scattered three-vessel coronary artery calcifications. No pericardial effusion. Mediastinum/Nodes: No enlarged mediastinal, hilar, or axillary lymph nodes. Thyroid gland, trachea, and esophagus demonstrate no significant findings. Lungs/Pleura: Redemonstrated postoperative findings of left pneumonectomy with a small volume of chronic, loculated fluid in the left hemithorax. Moderate to severe centrilobular emphysema of the remaining right lung. Interval increase in size of a spiculated nodule of the superior segment right lower lobe, measuring 1.5 x 1.1 cm, previously 0.7 cm when measured similarly (series 7, image 64). Musculoskeletal: No chest wall mass or suspicious bone lesions identified. CT ABDOMEN PELVIS FINDINGS Hepatobiliary:  No solid liver  abnormality is seen. Fluid attenuation cysts of the left lobe. Status post cholecystectomy. Postoperative biliary ductal dilatation. Pancreas: Unremarkable. No pancreatic ductal dilatation or surrounding inflammatory changes. Spleen: Normal in size without significant abnormality. Adrenals/Urinary Tract: Adrenal glands are unremarkable. Exophytic cysts of the kidney. Status post cystoprostatectomy with ileal conduit urinary diversion. There is new, moderate right hydronephrosis with an apparent soft tissue attenuation no left-sided hydronephrosis. Filling defect in the mid right ureter proximal to the ileal conduit (series 3, image 64). Stomach/Bowel: Stomach is within normal limits. Appendix appears normal. No evidence of bowel wall thickening, distention, or inflammatory changes. Vascular/Lymphatic: Aortic atherosclerosis. No enlarged abdominal or pelvic lymph nodes. Reproductive: No mass or other abnormality. Other: There is a large right hemiabdominal parastomal hernia containing multiple nonobstructed loops of small bowel. No abdominopelvic ascites. Musculoskeletal: No acute or significant osseous findings. IMPRESSION: 1. Interval increase in size of a spiculated nodule of the superior segment right lower lobe, measuring 1.5 x 1.1 cm, previously 0.7 cm when measured similarly. This is concerning for metachronous primary lung malignancy or metastasis. 2. Redemonstrated postoperative findings of left pneumonectomy with a small volume of chronic, loculated fluid in the left hemithorax. Leftward shift of the mediastinal contents status post pneumonectomy. 3. No specific evidence of metastatic disease in the abdomen or pelvis. 4. There is new, moderate right hydronephrosis with an apparent soft tissue attenuation filling defect of the mid right ureter, possibly reflecting clot material or obstructing soft tissue, generally concerning for malignancy. No left-sided hydronephrosis. 5. Status post cystoprostatectomy  with a redemonstrated large right hemiabdominal parastomal hernia containing multiple loops of nonobstructed small bowel. 6. Emphysema (ICD10-J43.9). 7. Coronary artery disease. Aortic Atherosclerosis (ICD10-I70.0). These results will be called to the ordering clinician or representative by the Radiologist Assistant, and communication documented in the PACS or [employer]. Electronically Signed   By: Eddie Candle M.D.   On: 08/19/2019 17:02   ASSESSMENT AND PLAN: This is a very pleasant 74 years old white male with: 1)  recurrent non-small cell lung cancer status post several treatment regimen including neoadjuvant chemotherapy followed by left upper lobectomy followed by systemic chemotherapy as well as maintenance treatment with Avastin and has been observation since June of 2010 and with no evidence for disease recurrence until November 2015 when he was found to have hypermetabolic activity in the left lower lobe.. The patient underwent redo thoracotomy with complete left pneumonectomy The patient has been on observation since that time and he is feeling fine. He had repeat CT scan of the chest, abdomen pelvis performed recently.  I personally and independently reviewed the scan images and discussed the results with the patient today. His scan showed further increase in the size of the right lower lobe pulmonary nodule suspicious for metachronous lung cancer. I recommended for the patient to have a PET scan for further evaluation of this lesion and to rule out disease recurrence.  I will see him back for follow-up visit in 2 weeks for evaluation and discussion of the scan results.  2) recent diagnosis of bladder cancer: Status post resection at [facility].he is currently followed by Dr.Tsivinn at [facility].  The scan showed new moderate right hydronephrosis with an apparent soft tissue attenuation filling defect of the mid right ureter.  The patient is scheduled to  see his urologist on September 05, 2019.  I gave him a copy of the scan to share with his urologist for further evaluation of this  area. I will arrange for the patient to continue to have Port-A-Cath flush every 8 weeks. The patient was advised to call immediately if he has any other concerning symptoms in the interval. The patient voices understanding of current disease status and treatment options and is in agreement with the current care plan.  All questions were answered. The patient knows to call the clinic with any problems, questions or concerns. We can certainly see the patient much sooner if necessary.  Disclaimer: This note was dictated with voice recognition software. Similar sounding words can inadvertently be transcribed and may be missed upon review.

## 2019-08-21 NOTE — Telephone Encounter (Signed)
Scheduled per los. Gave avs and calendar  

## 2019-09-03 ENCOUNTER — Ambulatory Visit (HOSPITAL_COMMUNITY)
Admission: RE | Admit: 2019-09-03 | Discharge: 2019-09-03 | Disposition: A | Discharge status: Home / Self Care | Payer: Medicare Other | Source: Ambulatory Visit | Attending: Internal Medicine | Admitting: Internal Medicine

## 2019-09-03 ENCOUNTER — Other Ambulatory Visit: Payer: Self-pay

## 2019-09-03 DIAGNOSIS — C3490 Malignant neoplasm of unspecified part of unspecified bronchus or lung: Secondary | ICD-10-CM | POA: Diagnosis present

## 2019-09-03 LAB — GLUCOSE, CAPILLARY: Glucose-Capillary: 118 mg/dL — ABNORMAL HIGH (ref 70–99)

## 2019-09-03 MED ORDER — FLUDEOXYGLUCOSE F - 18 (FDG) INJECTION
12.1500 | Freq: Once | INTRAVENOUS | Status: AC | PRN
  Administered 2019-09-03: 12.15 via INTRAVENOUS

## 2019-09-04 ENCOUNTER — Inpatient Hospital Stay (HOSPITAL_BASED_OUTPATIENT_CLINIC_OR_DEPARTMENT_OTHER): Payer: Medicare Other | Admitting: Internal Medicine

## 2019-09-04 ENCOUNTER — Encounter: Payer: Self-pay | Admitting: Internal Medicine

## 2019-09-04 ENCOUNTER — Other Ambulatory Visit: Payer: Self-pay

## 2019-09-04 VITALS — BP 127/64 | HR 60 | Temp 96.4°F | Resp 18 | Ht 70.0 in | Wt 246.6 lb

## 2019-09-04 DIAGNOSIS — C678 Malignant neoplasm of overlapping sites of bladder: Secondary | ICD-10-CM

## 2019-09-04 DIAGNOSIS — C3490 Malignant neoplasm of unspecified part of unspecified bronchus or lung: Secondary | ICD-10-CM

## 2019-09-04 DIAGNOSIS — C3432 Malignant neoplasm of lower lobe, left bronchus or lung: Secondary | ICD-10-CM | POA: Diagnosis not present

## 2019-09-04 NOTE — Progress Notes (Signed)
[place] [phone]   Fax:([phone]) [place], [place] [address] [place] [address]  PRINCIPAL DIAGNOSIS:  1) new right lower lobe pulmonary nodule suspicious for metachronous lung cancer. 2) Recurrent non-small cell lung cancer initially diagnosed as stage IIIA September 2006.  2) diagnosis of early stage bladder cancer in 2014: Status post resection followed by 6 months of intravesical BCG. 3) He has recurrent hematuria. Repeat cystoscopy and biopsy 01/16/2013: CIS, normal RPGs. CT scan abdomen: bilateral renal cysts. No adenopathy. Cystoscopy and biopsy December 2015: Positive cytologies 4) recurrent non-small cell lung cancer, adenocarcinoma involving the left lower lobe diagnosed in November 2015.  PRIOR THERAPY:  1. Status post 3 cycles of neoadjuvant chemotherapy with carboplatin and docetaxel, last dose was given November 22, 2004. 2. Status post left upper lobectomy with lymph node dissection under the care of Dr. Arlyce Dice on January 11, 2005. 3. Status post pericardial window on February 02, 2005 for evacuation of postoperative pericardial tamponade and the fluid was negative for malignancy. 4. Status post 3 cycles of adjuvant chemotherapy with carboplatin and gemcitabine. Last dose was given May 13, 2005. 5. Status post 5 cycles of systemic chemotherapy with carboplatin, paclitaxel and Avastin for disease recurrence. Last dose was given January 04, 2006 and the patient had stable disease by the end of the last cycle. 6. Status post maintenance treatment with Avastin 15 mg/kg given every 3 weeks. The patient is status post 42 cycles, discontinued on July 02, 2008 after the patient had stable disease for more than 2 years. 7. Status post Bilateral selective ureteral cytology, Bilateral retrograde pyelography, Transurethral resection of bladder tumor, Random bladder biopsies, Prostatic urethral  biopsy and Transrectal biopsy of the prostate under the care of Dr. Rutherford Limerick at [facility]. 8. Status post redo thoracotomy with left pneumonectomy under the care of Dr. Lianne Moris at [facility] on 03/05/2014.  CURRENT THERAPY: Observation.  INTERVAL HISTORY: Wesley Harmon 74 y.o. male returns to the clinic today for follow-up visit.  The patient is feeling fine today with no concerning complaints.  He denied having any chest pain, shortness of breath, cough or hemoptysis.  He denied having any fever or chills.  He has no nausea, vomiting, diarrhea or constipation.  He denied having any headache or visual changes.  The patient has no weight loss or night sweats.  He was found on previous CT scan of the chest to have suspicious right lower lobe lung nodule concerning for disease recurrence.  The patient had a PET scan performed recently and he is here for evaluation and discussion of his scan results and treatment options.  MEDICAL HISTORY: Past Medical History:  Diagnosis Date  . Bladder cancer ([place]) 01/02/13  . GERD (gastroesophageal reflux disease)   . Hyperlipemia   . Hypertension   . lung ca dx'd 07/2004   chemo comp 06/2008  . Lung cancer ([place])   . Neuropathy     ALLERGIES:  is allergic to dilaudid [hydromorphone hcl].  MEDICATIONS:  Current Outpatient Medications  Medication Sig Dispense Refill  . Albuterol Sulfate 108 (90 Base) MCG/ACT AEPB Inhale 2 puffs into the lungs every 6 (six) hours as needed. (Patient not taking: Reported on 08/21/2019)    . amLODipine (NORVASC) 10 MG tablet Take 10 mg by mouth daily.    Marland Kitchen aspirin EC 81 MG tablet Take 81 mg by mouth.    . Cholecalciferol (  VITAMIN D3) 1000 units CAPS Take 1,000 mg by mouth daily.    . cyanocobalamin 1000 MCG tablet Take 1,000 mcg by mouth daily.    Marland Kitchen gabapentin (NEURONTIN) 300 MG capsule Take 300 mg by mouth 3 (three) times daily.    Marland Kitchen levothyroxine (SYNTHROID) 50 MCG tablet Take 1 tablet by mouth  daily.    Marland Kitchen lisinopril (ZESTRIL) 10 MG tablet Take 20 mg by mouth daily.    . Multiple Vitamins-Minerals (MULTIVITAMIN WITH MINERALS) tablet Take 1 tablet by mouth daily.    Marland Kitchen omeprazole (PRILOSEC) 20 MG capsule Take 20 mg by mouth daily.    . polyethylene glycol (MIRALAX / GLYCOLAX) packet Take 17 g by mouth daily.    Marland Kitchen umeclidinium bromide (INCRUSE ELLIPTA) 62.5 MCG/INH AEPB Inhale 1 puff into the lungs daily.     No current facility-administered medications for this visit.    REVIEW OF SYSTEMS:  Constitutional: positive for fatigue Eyes: negative Ears, nose, mouth, throat, and face: negative Respiratory: negative Cardiovascular: negative Gastrointestinal: negative Genitourinary:negative Integument/breast: negative Hematologic/lymphatic: negative Musculoskeletal:positive for back pain Neurological: negative Behavioral/Psych: negative Endocrine: negative Allergic/Immunologic: negative   PHYSICAL EXAMINATION: General appearance: alert, cooperative and no distress Head: Normocephalic, without obvious abnormality, atraumatic Neck: no adenopathy Lymph nodes: Cervical, supraclavicular, and axillary nodes normal. Resp: Clear to auscultation on the right and absent breath sound on the left Back: symmetric, no curvature. ROM normal. No CVA tenderness. Cardio: regular rate and rhythm, S1, S2 normal, no murmur, click, rub or gallop GI: soft, non-tender; bowel sounds normal; no masses,  no organomegaly Extremities: extremities normal, atraumatic, no cyanosis or edema Neurologic: Alert and oriented X 3, normal strength and tone. Normal symmetric reflexes. Normal coordination and gait  ECOG PERFORMANCE STATUS: 1 - Symptomatic but completely ambulatory  Blood pressure 127/64, pulse 60, temperature (!) 96.4 F (35.8 C), temperature source Tympanic, resp. rate 18, height 5\' 10"  (1.778 m), weight 246 lb 9.6 oz (111.9 kg), SpO2 97 %.  LABORATORY DATA: Lab Results  Component Value Date    WBC 7.2 08/19/2019   HGB 12.3 (L) 08/19/2019   HCT 37.9 (L) 08/19/2019   MCV 94.0 08/19/2019   PLT 185 08/19/2019      Chemistry      Component Value Date/Time   NA 141 08/19/2019 1013   NA 138 08/24/2016 1501   K 4.8 08/19/2019 1013   K 4.0 08/24/2016 1501   CL 109 08/19/2019 1013   CL 102 09/30/2011 0843   CO2 24 08/19/2019 1013   CO2 26 08/24/2016 1501   BUN 27 (H) 08/19/2019 1013   BUN 23.0 08/24/2016 1501   CREATININE 1.41 (H) 08/19/2019 1013   CREATININE 1.1 08/24/2016 1501      Component Value Date/Time   CALCIUM 10.0 08/19/2019 1013   CALCIUM 9.3 08/24/2016 1501   ALKPHOS 95 08/19/2019 1013   ALKPHOS 111 08/24/2016 1501   AST 15 08/19/2019 1013   AST 19 08/24/2016 1501   ALT 11 08/19/2019 1013   ALT 16 08/24/2016 1501   BILITOT 0.4 08/19/2019 1013   BILITOT 0.72 08/24/2016 1501       RADIOGRAPHIC STUDIES: CT Chest W Contrast  Result Date: 08/19/2019 CLINICAL DATA:  Non-small cell lung cancer staging, status post left pneumonectomy, bladder cancer, status post cysto prostatectomy and ileal conduit urinary diversion EXAM: CT CHEST, ABDOMEN, AND PELVIS WITH CONTRAST TECHNIQUE: Multidetector CT imaging of the chest, abdomen and pelvis was performed following the standard protocol during bolus administration of intravenous contrast. CONTRAST:  132mL OMNIPAQUE IOHEXOL 300 MG/ML SOLN, additional oral enteric contrast COMPARISON:  08/17/2018 FINDINGS: CT CHEST FINDINGS Cardiovascular: Left chest port catheter. Leftward shift of the mediastinal contents status post pneumonectomy. Aortic atherosclerosis. Normal heart size. Scattered three-vessel coronary artery calcifications. No pericardial effusion. Mediastinum/Nodes: No enlarged mediastinal, hilar, or axillary lymph nodes. Thyroid gland, trachea, and esophagus demonstrate no significant findings. Lungs/Pleura: Redemonstrated postoperative findings of left pneumonectomy with a small volume of chronic, loculated fluid in the  left hemithorax. Moderate to severe centrilobular emphysema of the remaining right lung. Interval increase in size of a spiculated nodule of the superior segment right lower lobe, measuring 1.5 x 1.1 cm, previously 0.7 cm when measured similarly (series 7, image 64). Musculoskeletal: No chest wall mass or suspicious bone lesions identified. CT ABDOMEN PELVIS FINDINGS Hepatobiliary: No solid liver abnormality is seen. Fluid attenuation cysts of the left lobe. Status post cholecystectomy. Postoperative biliary ductal dilatation. Pancreas: Unremarkable. No pancreatic ductal dilatation or surrounding inflammatory changes. Spleen: Normal in size without significant abnormality. Adrenals/Urinary Tract: Adrenal glands are unremarkable. Exophytic cysts of the kidney. Status post cystoprostatectomy with ileal conduit urinary diversion. There is new, moderate right hydronephrosis with an apparent soft tissue attenuation no left-sided hydronephrosis. Filling defect in the mid right ureter proximal to the ileal conduit (series 3, image 64). Stomach/Bowel: Stomach is within normal limits. Appendix appears normal. No evidence of bowel wall thickening, distention, or inflammatory changes. Vascular/Lymphatic: Aortic atherosclerosis. No enlarged abdominal or pelvic lymph nodes. Reproductive: No mass or other abnormality. Other: There is a large right hemiabdominal parastomal hernia containing multiple nonobstructed loops of small bowel. No abdominopelvic ascites. Musculoskeletal: No acute or significant osseous findings. IMPRESSION: 1. Interval increase in size of a spiculated nodule of the superior segment right lower lobe, measuring 1.5 x 1.1 cm, previously 0.7 cm when measured similarly. This is concerning for metachronous primary lung malignancy or metastasis. 2. Redemonstrated postoperative findings of left pneumonectomy with a small volume of chronic, loculated fluid in the left hemithorax. Leftward shift of the mediastinal  contents status post pneumonectomy. 3. No specific evidence of metastatic disease in the abdomen or pelvis. 4. There is new, moderate right hydronephrosis with an apparent soft tissue attenuation filling defect of the mid right ureter, possibly reflecting clot material or obstructing soft tissue, generally concerning for malignancy. No left-sided hydronephrosis. 5. Status post cystoprostatectomy with a redemonstrated large right hemiabdominal parastomal hernia containing multiple loops of nonobstructed small bowel. 6. Emphysema (ICD10-J43.9). 7. Coronary artery disease. Aortic Atherosclerosis (ICD10-I70.0). These results will be called to the ordering clinician or representative by the Radiologist Assistant, and communication documented in the PACS or [employer]. Electronically Signed   By: Eddie Candle M.D.   On: 08/19/2019 17:02   CT Abdomen Pelvis W Contrast  Result Date: 08/19/2019 CLINICAL DATA:  Non-small cell lung cancer staging, status post left pneumonectomy, bladder cancer, status post cysto prostatectomy and ileal conduit urinary diversion EXAM: CT CHEST, ABDOMEN, AND PELVIS WITH CONTRAST TECHNIQUE: Multidetector CT imaging of the chest, abdomen and pelvis was performed following the standard protocol during bolus administration of intravenous contrast. CONTRAST:  120mL OMNIPAQUE IOHEXOL 300 MG/ML SOLN, additional oral enteric contrast COMPARISON:  08/17/2018 FINDINGS: CT CHEST FINDINGS Cardiovascular: Left chest port catheter. Leftward shift of the mediastinal contents status post pneumonectomy. Aortic atherosclerosis. Normal heart size. Scattered three-vessel coronary artery calcifications. No pericardial effusion. Mediastinum/Nodes: No enlarged mediastinal, hilar, or axillary lymph nodes. Thyroid gland, trachea, and esophagus demonstrate no significant findings. Lungs/Pleura: Redemonstrated postoperative findings of left  pneumonectomy with a small volume of chronic, loculated fluid in the left  hemithorax. Moderate to severe centrilobular emphysema of the remaining right lung. Interval increase in size of a spiculated nodule of the superior segment right lower lobe, measuring 1.5 x 1.1 cm, previously 0.7 cm when measured similarly (series 7, image 64). Musculoskeletal: No chest wall mass or suspicious bone lesions identified. CT ABDOMEN PELVIS FINDINGS Hepatobiliary: No solid liver abnormality is seen. Fluid attenuation cysts of the left lobe. Status post cholecystectomy. Postoperative biliary ductal dilatation. Pancreas: Unremarkable. No pancreatic ductal dilatation or surrounding inflammatory changes. Spleen: Normal in size without significant abnormality. Adrenals/Urinary Tract: Adrenal glands are unremarkable. Exophytic cysts of the kidney. Status post cystoprostatectomy with ileal conduit urinary diversion. There is new, moderate right hydronephrosis with an apparent soft tissue attenuation no left-sided hydronephrosis. Filling defect in the mid right ureter proximal to the ileal conduit (series 3, image 64). Stomach/Bowel: Stomach is within normal limits. Appendix appears normal. No evidence of bowel wall thickening, distention, or inflammatory changes. Vascular/Lymphatic: Aortic atherosclerosis. No enlarged abdominal or pelvic lymph nodes. Reproductive: No mass or other abnormality. Other: There is a large right hemiabdominal parastomal hernia containing multiple nonobstructed loops of small bowel. No abdominopelvic ascites. Musculoskeletal: No acute or significant osseous findings. IMPRESSION: 1. Interval increase in size of a spiculated nodule of the superior segment right lower lobe, measuring 1.5 x 1.1 cm, previously 0.7 cm when measured similarly. This is concerning for metachronous primary lung malignancy or metastasis. 2. Redemonstrated postoperative findings of left pneumonectomy with a small volume of chronic, loculated fluid in the left hemithorax. Leftward shift of the mediastinal  contents status post pneumonectomy. 3. No specific evidence of metastatic disease in the abdomen or pelvis. 4. There is new, moderate right hydronephrosis with an apparent soft tissue attenuation filling defect of the mid right ureter, possibly reflecting clot material or obstructing soft tissue, generally concerning for malignancy. No left-sided hydronephrosis. 5. Status post cystoprostatectomy with a redemonstrated large right hemiabdominal parastomal hernia containing multiple loops of nonobstructed small bowel. 6. Emphysema (ICD10-J43.9). 7. Coronary artery disease. Aortic Atherosclerosis (ICD10-I70.0). These results will be called to the ordering clinician or representative by the Radiologist Assistant, and communication documented in the PACS or [employer]. Electronically Signed   By: Eddie Candle M.D.   On: 08/19/2019 17:02   NM PET Image Restag (PS) Skull Base To Thigh  Result Date: 09/03/2019 CLINICAL DATA:  Subsequent treatment strategy for non-small cell lung cancer. History of left pneumonectomy. Enlarging right lung lesion. EXAM: NUCLEAR MEDICINE PET SKULL BASE TO THIGH TECHNIQUE: 12.15 mCi F-18 FDG was injected intravenously. Full-ring PET imaging was performed from the skull base to thigh after the radiotracer. CT data was obtained and used for attenuation correction and anatomic localization. Fasting blood glucose: 118 mg/dl COMPARISON:  Chest CT 08/19/2019 FINDINGS: Mediastinal blood pool activity: SUV max 2.96 Liver activity: SUV max NA NECK: No hypermetabolic lymph nodes in the neck. Incidental CT findings: none CHEST: The 15 mm superior segment right lower lobe lung lesion is hypermetabolic with SUV max of 0.27 consistent with neoplasm. This is most likely a metachronous lung cancer. I think metastatic disease is much less likely. No other pulmonary lesions are identified. Mild nodular thickening of the major fissure is stable since 2019 and likely small lymph node. Surgical changes  from prior pneumonectomy. No findings suspicious for recurrent tumor in the residual left hemithorax. 6.5 mm precarinal lymph node shows mild hypermetabolism with SUV max of 4.44 and is  worrisome for a metastatic lymph node. No definite right hilar adenopathy. 6 mm subcarinal lymph node is mildly hypermetabolic with SUV max of 5.39 Incidental CT findings: Stable advanced emphysematous changes in the right lung. Benign sebaceous cyst noted posterior to the left shoulder. Stable mild gynecomastia but no chest wall mass, supraclavicular or axillary adenopathy. ABDOMEN/PELVIS: No findings suspicious for abdominal/pelvic metastatic disease. On the CT scan there is abnormal soft tissue density involving the right mid ureter and associated moderate to marked right-sided hydroureteronephrosis. This is difficult to evaluate on the PET scan and because of the marked activity in the right collecting system in ureter. Findings quite suspicious for ureteral tumor. Recommend urology evaluation/ureteroscopy. Incidental CT findings: Stable surgical changes from a cystectomy and urinary diversion. Large ventral abdominal wall hernia containing small bowel loops and the ileal conduit. Stable atherosclerotic calcifications involving the abdominal aorta and iliac arteries. Stable renal cysts. SKELETON: No findings suspicious for osseous metastatic disease. Incidental CT findings: none IMPRESSION: 1. Superior segment right lower lobe pulmonary lesion is hypermetabolic and consistent with neoplasm. This is most likely a metachronous lung cancer. 2. Precarinal lymph node is hypermetabolic and worrisome for metastatic adenopathy. 3. Status post left-sided pneumonectomy. No findings suspicious for recurrent tumor. 4. No findings for abdominal/pelvic metastatic disease or osseous metastatic disease. 5. 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 This is not well evaluated on the PET scan due to marked activity in  the urine. Recommend urology consultation/ureteroscopy. Electronically Signed   By: Marijo Sanes M.D.   On: 09/03/2019 14:08   ASSESSMENT AND PLAN: This is a very pleasant 74 years old white male with: 1)  recurrent non-small cell lung cancer status post several treatment regimen including neoadjuvant chemotherapy followed by left upper lobectomy followed by systemic chemotherapy as well as maintenance treatment with Avastin and has been observation since June of 2010 and with no evidence for disease recurrence until November 2015 when he was found to have hypermetabolic activity in the left lower lobe.. The patient underwent redo thoracotomy with complete left pneumonectomy The patient has been on observation since that time and he is feeling fine. Recent CT scan of the chest showed further increase in the size of the right lower lobe pulmonary nodule suspicious for metachronous lung cancer.  I ordered a PET scan that confirms hypermetabolic activity and the right lower lobe lung nodule but there was very small precarinal and subcarinal lymphadenopathy suspicious for metastatic adenopathy. I recommended for the patient to have CT-guided core biopsy of the left lower lobe lung nodule for confirmation of the tissue diagnosis. I also referred the patient to radiation oncology for evaluation and consideration of SRS to the left lower lobe lung mass.  The mediastinal lymphadenopathy or hypermetabolic but they are very small.  They are still suspicious for metastatic disease but I am sure it will be very hard to biopsy with the small size. I will see the patient back for follow-up visit in around 4 months for evaluation and repeat CT scan of the chest, abdomen pelvis for restaging of his disease after the treatment. He was advised to call immediately if he has any other concerning symptoms in the interval.  2) recent diagnosis of bladder cancer: Status post resection at [facility].he is  currently followed by Dr.Tsivinn at [facility].  The scan showed new moderate right hydronephrosis with an apparent soft tissue attenuation filling defect of the mid right ureter.  The patient is scheduled to  see his urologist on September 05, 2019.  I gave him a copy of the scan to share with his urologist for further evaluation of this area. I will arrange for the patient to continue to have Port-A-Cath flush every 8 weeks.  The patient voices understanding of current disease status and treatment options and is in agreement with the current care plan.  All questions were answered. The patient knows to call the clinic with any problems, questions or concerns. We can certainly see the patient much sooner if necessary.  Disclaimer: This note was dictated with voice recognition software. Similar sounding words can inadvertently be transcribed and may be missed upon review.

## 2019-09-05 ENCOUNTER — Encounter (HOSPITAL_COMMUNITY): Payer: Self-pay | Admitting: Radiology

## 2019-09-05 NOTE — Progress Notes (Signed)
Wesley Harmon. Mehra Male, 74 y.o., 1945/08/28 MRN:  [id number] Phone:  [phone] (H) PCP:  [name], MD Primary Cvg:  Medicare/Medicare Part A And B Next Appt With Oncology 10/16/2019 at 9:45 AM  RE: CT Biopsy Received: Today Arne Cleveland, MD  Jillyn Hidden [place]   CT core RLL PET+ nodule  High risk    DDH       Previous Messages   ----- Message -----  From: Garth Bigness D  Sent: 09/04/2019 11:32 AM EDT  To: [facility] Procedure Requests  Subject: CT Biopsy                     Procedure: CT Biopsy   Reason: Malignant neoplasm of unspecified part of unspecified bronchus or lung, CT-guided core biopsy of right lower lobe lung nodule   History: CT, NM PET in computer   Provider: Curt Bears   Provider Contact: [phone]

## 2019-09-10 ENCOUNTER — Telehealth: Payer: Self-pay | Admitting: Internal Medicine

## 2019-09-10 NOTE — Telephone Encounter (Signed)
Scheduled per los. Called and spoke with patients wife. Confirmed appt

## 2019-09-11 ENCOUNTER — Other Ambulatory Visit: Payer: Self-pay | Admitting: Radiology

## 2019-09-13 ENCOUNTER — Other Ambulatory Visit (HOSPITAL_COMMUNITY)
Admission: RE | Admit: 2019-09-13 | Discharge: 2019-09-13 | Disposition: A | Discharge status: Home / Self Care | Payer: Medicare Other | Source: Ambulatory Visit | Attending: Internal Medicine | Admitting: Internal Medicine

## 2019-09-13 ENCOUNTER — Other Ambulatory Visit: Payer: Self-pay | Admitting: Radiology

## 2019-09-13 DIAGNOSIS — Z20822 Contact with and (suspected) exposure to covid-19: Secondary | ICD-10-CM | POA: Diagnosis not present

## 2019-09-13 DIAGNOSIS — Z01812 Encounter for preprocedural laboratory examination: Secondary | ICD-10-CM | POA: Insufficient documentation

## 2019-09-13 LAB — SARS CORONAVIRUS 2 (TAT 6-24 HRS): SARS Coronavirus 2: NEGATIVE

## 2019-09-16 ENCOUNTER — Ambulatory Visit (HOSPITAL_COMMUNITY)
Admission: RE | Admit: 2019-09-16 | Discharge: 2019-09-16 | Disposition: A | Discharge status: Home / Self Care | Payer: Medicare Other | Source: Ambulatory Visit | Attending: Internal Medicine | Admitting: Internal Medicine

## 2019-09-16 ENCOUNTER — Other Ambulatory Visit: Payer: Self-pay

## 2019-09-16 ENCOUNTER — Ambulatory Visit (HOSPITAL_COMMUNITY)
Admission: RE | Admit: 2019-09-16 | Discharge: 2019-09-16 | Disposition: A | Discharge status: Home / Self Care | Payer: Medicare Other | Source: Ambulatory Visit | Attending: Interventional Radiology | Admitting: Interventional Radiology

## 2019-09-16 ENCOUNTER — Encounter (HOSPITAL_COMMUNITY): Payer: Self-pay

## 2019-09-16 DIAGNOSIS — K219 Gastro-esophageal reflux disease without esophagitis: Secondary | ICD-10-CM | POA: Insufficient documentation

## 2019-09-16 DIAGNOSIS — Z85118 Personal history of other malignant neoplasm of bronchus and lung: Secondary | ICD-10-CM | POA: Diagnosis not present

## 2019-09-16 DIAGNOSIS — Z9221 Personal history of antineoplastic chemotherapy: Secondary | ICD-10-CM | POA: Insufficient documentation

## 2019-09-16 DIAGNOSIS — Z9889 Other specified postprocedural states: Secondary | ICD-10-CM | POA: Diagnosis not present

## 2019-09-16 DIAGNOSIS — Z8551 Personal history of malignant neoplasm of bladder: Secondary | ICD-10-CM | POA: Diagnosis not present

## 2019-09-16 DIAGNOSIS — Z902 Acquired absence of lung [part of]: Secondary | ICD-10-CM | POA: Insufficient documentation

## 2019-09-16 DIAGNOSIS — G629 Polyneuropathy, unspecified: Secondary | ICD-10-CM | POA: Diagnosis not present

## 2019-09-16 DIAGNOSIS — Z9049 Acquired absence of other specified parts of digestive tract: Secondary | ICD-10-CM | POA: Insufficient documentation

## 2019-09-16 DIAGNOSIS — E785 Hyperlipidemia, unspecified: Secondary | ICD-10-CM | POA: Insufficient documentation

## 2019-09-16 DIAGNOSIS — R911 Solitary pulmonary nodule: Secondary | ICD-10-CM | POA: Diagnosis present

## 2019-09-16 DIAGNOSIS — Z87891 Personal history of nicotine dependence: Secondary | ICD-10-CM | POA: Insufficient documentation

## 2019-09-16 DIAGNOSIS — Z7982 Long term (current) use of aspirin: Secondary | ICD-10-CM | POA: Insufficient documentation

## 2019-09-16 DIAGNOSIS — Z9079 Acquired absence of other genital organ(s): Secondary | ICD-10-CM | POA: Insufficient documentation

## 2019-09-16 DIAGNOSIS — I10 Essential (primary) hypertension: Secondary | ICD-10-CM | POA: Diagnosis not present

## 2019-09-16 DIAGNOSIS — Z79890 Hormone replacement therapy (postmenopausal): Secondary | ICD-10-CM | POA: Insufficient documentation

## 2019-09-16 DIAGNOSIS — C3490 Malignant neoplasm of unspecified part of unspecified bronchus or lung: Secondary | ICD-10-CM

## 2019-09-16 DIAGNOSIS — Z79899 Other long term (current) drug therapy: Secondary | ICD-10-CM | POA: Diagnosis not present

## 2019-09-16 LAB — CBC
HCT: 35.9 % — ABNORMAL LOW (ref 39.0–52.0)
Hemoglobin: 11.3 g/dL — ABNORMAL LOW (ref 13.0–17.0)
MCH: 30.5 pg (ref 26.0–34.0)
MCHC: 31.5 g/dL (ref 30.0–36.0)
MCV: 97 fL (ref 80.0–100.0)
Platelets: 201 10*3/uL (ref 150–400)
RBC: 3.7 MIL/uL — ABNORMAL LOW (ref 4.22–5.81)
RDW: 13.5 % (ref 11.5–15.5)
WBC: 6.4 10*3/uL (ref 4.0–10.5)
nRBC: 0 % (ref 0.0–0.2)

## 2019-09-16 LAB — PROTIME-INR
INR: 1 (ref 0.8–1.2)
Prothrombin Time: 12.4 seconds (ref 11.4–15.2)

## 2019-09-16 MED ORDER — SODIUM CHLORIDE 0.9 % IV SOLN: INTRAVENOUS | Status: DC

## 2019-09-16 MED ORDER — MIDAZOLAM HCL 2 MG/2ML IJ SOLN
INTRAMUSCULAR | Status: AC | PRN
  Administered 2019-09-16: 0.5 mg via INTRAVENOUS
  Administered 2019-09-16: 1 mg via INTRAVENOUS

## 2019-09-16 MED ORDER — MIDAZOLAM HCL 2 MG/2ML IJ SOLN
INTRAMUSCULAR | Status: AC
  Filled 2019-09-16: qty 4

## 2019-09-16 MED ORDER — LIDOCAINE HCL 1 % IJ SOLN
INTRAMUSCULAR | Status: AC
  Filled 2019-09-16: qty 20

## 2019-09-16 MED ORDER — FENTANYL CITRATE (PF) 100 MCG/2ML IJ SOLN
INTRAMUSCULAR | Status: AC
  Filled 2019-09-16: qty 4

## 2019-09-16 MED ORDER — FENTANYL CITRATE (PF) 100 MCG/2ML IJ SOLN
INTRAMUSCULAR | Status: AC | PRN
  Administered 2019-09-16: 25 ug via INTRAVENOUS
  Administered 2019-09-16: 50 ug via INTRAVENOUS

## 2019-09-16 MED ORDER — HEPARIN SOD (PORK) LOCK FLUSH 100 UNIT/ML IV SOLN
500.0000 [IU] | INTRAVENOUS | Status: AC | PRN
  Administered 2019-09-16: 500 [IU]
  Filled 2019-09-16: qty 5

## 2019-09-16 NOTE — Sedation Documentation (Signed)
Patient has small dark red blood clot suction by nurse. Denies shortness of breath airway intact.

## 2019-09-16 NOTE — Discharge Instructions (Addendum)
Lung Biopsy, Care After This sheet gives you information about how to care for yourself after your procedure. Your health care provider may also give you more specific instructions depending on the type of biopsy you had. If you have problems or questions, contact your health care provider. What can I expect after the procedure? After the procedure, it is common to have:  A cough.  A sore throat.  Pain where a needle, bronchoscope, or incision was used to collect a biopsy sample (biopsy site). Follow these instructions at home: Medicines  Take over-the-counter and prescription medicines only as told by your health care provider.  Do not drink alcohol if your health care provider tells you not to drink.  Ask your health care provider if the medicine prescribed to you: ? Requires you to avoid driving or using heavy machinery. ? Can cause constipation. You may need to take these actions to prevent or treat constipation:  Drink enough fluid to keep your urine pale yellow.  Take over-the-counter or prescription medicines.  Eat foods that are high in fiber, such as beans, whole grains, and fresh fruits and vegetables.  Limit foods that are high in fat and processed sugars, such as fried or sweet foods.  Do not drive for 24 hours if you were given a sedative. Biopsy site care   Follow instructions from your health care provider about how to take care of your biopsy site. Make sure you: ? Wash your hands with soap and water before and after you change your bandage (dressing). If soap and water are not available, use hand sanitizer. ? Change your dressing as told by your health care provider. ? Leave stitches (sutures), skin glue, or adhesive strips in place. These skin closures may need to stay in place for 2 weeks or longer. If adhesive strip edges start to loosen and curl up, you may trim the loose edges. Do not remove adhesive strips completely unless your health care provider tells  you to do that.  Do not take baths, swim, or use a hot tub until your health care provider approves. Ask your health care provider if you may take showers. You may only be allowed to take sponge baths.  Check your biopsy site every day for signs of infection. Check for: ? Redness, swelling, or more pain. ? Fluid or blood. ? Warmth. ? Pus or a bad smell. General instructions  Return to your normal activities as told by your health care provider. Ask your health care provider what activities are safe for you.  It is up to you to get the results of your procedure. Ask your health care provider, or the department that is doing the procedure, when your results will be ready.  Keep all follow-up visits as told by your health care provider. This is important. Contact a health care provider if:  You have a fever.  You have redness, swelling, or more pain around your biopsy site.  You have fluid or blood coming from your biopsy site.  Your biopsy site feels warm to the touch.  You have pus or a bad smell coming from your biopsy site.  You have pain that does not get better with medicine. Get help right away if:  You cough up blood.  You have trouble breathing.  You have chest pain.  You lose consciousness. Summary  After the procedure, it is common to have a sore throat and a cough.  Return to your normal activities as told by your  health care provider. Ask your health care provider what activities are safe for you.  Take over-the-counter and prescription medicines only as told by your health care provider.  Report any unusual symptoms to your health care provider. This information is not intended to replace advice given to you by your health care provider. Make sure you discuss any questions you have with your health care provider. Document Revised: 02/07/2018 Document Reviewed: 02/02/2016 Elsevier Patient Education  [place]. Moderate Conscious Sedation,  Adult Sedation is the use of medicines to promote relaxation and relieve discomfort and anxiety. Moderate conscious sedation is a type of sedation. Under moderate conscious sedation, you are less alert than normal, but you are still able to respond to instructions, touch, or both. Moderate conscious sedation is used during short medical and dental procedures. It is milder than deep sedation, which is a type of sedation under which you cannot be easily woken up. It is also milder than general anesthesia, which is the use of medicines to make you unconscious. Moderate conscious sedation allows you to return to your regular activities sooner. Tell a health care provider about:  Any allergies you have.  All medicines you are taking, including vitamins, herbs, eye drops, creams, and over-the-counter medicines.  Use of steroids (by mouth or creams).  Any problems you or family members have had with sedatives and anesthetic medicines.  Any blood disorders you have.  Any surgeries you have had.  Any medical conditions you have, such as sleep apnea.  Whether you are pregnant or may be pregnant.  Any use of cigarettes, alcohol, marijuana, or street drugs. What are the risks? Generally, this is a safe procedure. However, problems may occur, including:  Getting too much medicine (oversedation).  Nausea.  Allergic reaction to medicines.  Trouble breathing. If this happens, a breathing tube may be used to help with breathing. It will be removed when you are awake and breathing on your own.  Heart trouble.  Lung trouble. What happens before the procedure? Staying hydrated Follow instructions from your health care provider about hydration, which may include:  Up to 2 hours before the procedure - you may continue to drink clear liquids, such as water, clear fruit juice, black coffee, and plain tea. Eating and drinking restrictions Follow instructions from your health care provider about  eating and drinking, which may include:  8 hours before the procedure - stop eating heavy meals or foods such as meat, fried foods, or fatty foods.  6 hours before the procedure - stop eating light meals or foods, such as toast or cereal.  6 hours before the procedure - stop drinking milk or drinks that contain milk.  2 hours before the procedure - stop drinking clear liquids. Medicine Ask your health care provider about:  Changing or stopping your regular medicines. This is especially important if you are taking diabetes medicines or blood thinners.  Taking medicines such as aspirin and ibuprofen. These medicines can thin your blood. Do not take these medicines before your procedure if your health care provider instructs you not to.  Tests and exams  You will have a physical exam.  You may have blood tests done to show: ? How well your kidneys and liver are working. ? How well your blood can clot. General instructions  Plan to have someone take you home from the hospital or clinic.  If you will be going home right after the procedure, plan to have someone with you for 24 hours.  What happens during the procedure?  An IV tube will be inserted into one of your veins.  Medicine to help you relax (sedative) will be given through the IV tube.  The medical or dental procedure will be performed. What happens after the procedure?  Your blood pressure, heart rate, breathing rate, and blood oxygen level will be monitored often until the medicines you were given have worn off.  Do not drive for 24 hours. This information is not intended to replace advice given to you by your health care provider. Make sure you discuss any questions you have with your health care provider. Document Revised: 12/16/2016 Document Reviewed: 04/25/2015 Elsevier Patient Education  2020 [employer].

## 2019-09-16 NOTE — Progress Notes (Signed)
Patient was given discharge instructions. He verbalized understanding. 

## 2019-09-16 NOTE — Procedures (Signed)
Interventional Radiology Procedure Note  Procedure: CT BX RLL NODULE    Complications: None  Estimated Blood Loss:  MIN  Findings: 55 G CORES    Wesley Punt, MD

## 2019-09-16 NOTE — H&P (Addendum)
Chief Complaint: Patient was seen in consultation today for right lung mass biopsy at the request of [facility]  Referring Physician(s): Mohamed,Mohamed  Supervising Physician: Daryll Brod  Patient Status: [facility] - Out-pt  History of Present Illness: Wesley Harmon is a 74 y.o. male   Known previous Lung Ca 2006  Recurrent 2015 LUL surgery 2006; left pneumonectomy 2016 - [facility] Previous bladder cancer 2014  New right lung mass CT 08/19/19: IMPRESSION: 1. Interval increase in size of a spiculated nodule of the superior segment right lower lobe, measuring 1.5 x 1.1 cm, previously 0.7 cm when measured similarly. This is concerning for metachronous primary lung malignancy or metastasis.  PET 09/03/19:  IMPRESSION: 1. Superior segment right lower lobe pulmonary lesion is hypermetabolic and consistent with neoplasm. This is most likely a metachronous lung cancer. 2. Precarinal lymph node is hypermetabolic and worrisome for metastatic adenopathy. 3. Status post left-sided pneumonectomy. No findings suspicious for recurrent tumor. 4. No findings for abdominal/pelvic metastatic disease or osseous metastatic disease. 5. 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 This is not well evaluated on the PET scan due to marked activity in the urine. Recommend urology consultation/ureteroscopy.  Follows with Dr Julien Nordmann Scheduled now for biopsy of RLL mass    Past Medical History:  Diagnosis Date  . Bladder cancer ([place]) 01/02/13  . GERD (gastroesophageal reflux disease)   . Hyperlipemia   . Hypertension   . lung ca dx'd 07/2004   chemo comp 06/2008  . Lung cancer ([place])   . Neuropathy     Past Surgical History:  Procedure Laterality Date  . arm surgery    . CARDIAC SURGERY    . HERNIA REPAIR      Allergies: Dilaudid [hydromorphone hcl]  Medications: Prior to Admission medications   Medication Sig Start Date End Date Taking?  Authorizing Provider  amLODipine (NORVASC) 10 MG tablet Take 10 mg by mouth daily. 07/19/17  Yes [provider]  aspirin EC 81 MG tablet Take 81 mg by mouth.   Yes [provider]  Cholecalciferol (VITAMIN D3) 1000 units CAPS Take 1,000 mg by mouth daily.   Yes [provider]  cyanocobalamin 1000 MCG tablet Take 1,000 mcg by mouth daily.   Yes [provider]  gabapentin (NEURONTIN) 300 MG capsule Take 300 mg by mouth 3 (three) times daily.   Yes [provider]  levothyroxine (SYNTHROID) 50 MCG tablet Take 1 tablet by mouth daily. 06/18/18  Yes [provider]  lisinopril (ZESTRIL) 10 MG tablet Take 20 mg by mouth daily. 01/23/19  Yes [provider]  Multiple Vitamins-Minerals (MULTIVITAMIN WITH MINERALS) tablet Take 1 tablet by mouth daily.   Yes [provider]  omeprazole (PRILOSEC) 20 MG capsule Take 20 mg by mouth daily. 06/07/19  Yes [provider]  polyethylene glycol (MIRALAX / GLYCOLAX) packet Take 17 g by mouth daily.   Yes [provider]  umeclidinium bromide (INCRUSE ELLIPTA) 62.5 MCG/INH AEPB Inhale 1 puff into the lungs daily. 02/02/15  Yes [provider]  Albuterol Sulfate 108 (90 Base) MCG/ACT AEPB Inhale 2 puffs into the lungs every 6 (six) hours as needed. Patient not taking: Reported on 08/21/2019    [provider]     History reviewed. No pertinent family history.  Social History   Socioeconomic History  . Marital status: Married    Spouse name: Not on file  . Number of children: Not on file  . Years of education: Not  on file  . Highest education level: Not on file  Occupational History  . Not on file  Tobacco Use  . Smoking status: Former Smoker    Packs/day: 3.00    Years: 54.00    Pack years: 162.00    Quit date: 09/27/2004    Years since quitting: 14.9  . Smokeless tobacco: Never Used  Substance and Sexual Activity  . Alcohol use: Not on file  . Drug  use: Not on file  . Sexual activity: Not on file  Other Topics Concern  . Not on file  Social History Narrative  . Not on file   Social Determinants of Health   Financial Resource Strain:   . Difficulty of Paying Living Expenses: Not on file  Food Insecurity:   . Worried About Charity fundraiser in the Last Year: Not on file  . Ran Out of Food in the Last Year: Not on file  Transportation Needs:   . Lack of Transportation (Medical): Not on file  . Lack of Transportation (Non-Medical): Not on file  Physical Activity:   . Days of Exercise per Week: Not on file  . Minutes of Exercise per Session: Not on file  Stress:   . Feeling of Stress : Not on file  Social Connections:   . Frequency of Communication with Friends and Family: Not on file  . Frequency of Social Gatherings with Friends and Family: Not on file  . Attends Religious Services: Not on file  . Active Member of Clubs or Organizations: Not on file  . Attends Archivist Meetings: Not on file  . Marital Status: Not on file    Review of Systems: A 12 point ROS discussed and pertinent positives are indicated in the HPI above.  All other systems are negative.  Review of Systems  Constitutional: Negative for activity change, fatigue and fever.  Respiratory: Negative for cough, choking and shortness of breath.   Cardiovascular: Negative for chest pain.  Gastrointestinal: Negative for abdominal pain.  Musculoskeletal: Negative for back pain.  Neurological: Negative for weakness.  Psychiatric/Behavioral: Negative for behavioral problems and confusion.    Vital Signs: There were no vitals taken for this visit.  Physical Exam Vitals reviewed.  Cardiovascular:     Rate and Rhythm: Normal rate and regular rhythm.     Heart sounds: Normal heart sounds.  Pulmonary:     Effort: Pulmonary effort is normal. No respiratory distress.     Breath sounds: Normal breath sounds.  Abdominal:     Palpations: Abdomen is  soft.  Musculoskeletal:        General: Normal range of motion.  Skin:    General: Skin is warm.  Neurological:     Mental Status: He is oriented to person, place, and time.  Psychiatric:        Behavior: Behavior normal.     Imaging: CT Chest W Contrast  Result Date: 08/19/2019 CLINICAL DATA:  Non-small cell lung cancer staging, status post left pneumonectomy, bladder cancer, status post cysto prostatectomy and ileal conduit urinary diversion EXAM: CT CHEST, ABDOMEN, AND PELVIS WITH CONTRAST TECHNIQUE: Multidetector CT imaging of the chest, abdomen and pelvis was performed following the standard protocol during bolus administration of intravenous contrast. CONTRAST:  145mL OMNIPAQUE IOHEXOL 300 MG/ML SOLN, additional oral enteric contrast COMPARISON:  08/17/2018 FINDINGS: CT CHEST FINDINGS Cardiovascular: Left chest port catheter. Leftward shift of the mediastinal contents status post pneumonectomy. Aortic atherosclerosis. Normal heart size. Scattered three-vessel coronary artery calcifications.  No pericardial effusion. Mediastinum/Nodes: No enlarged mediastinal, hilar, or axillary lymph nodes. Thyroid gland, trachea, and esophagus demonstrate no significant findings. Lungs/Pleura: Redemonstrated postoperative findings of left pneumonectomy with a small volume of chronic, loculated fluid in the left hemithorax. Moderate to severe centrilobular emphysema of the remaining right lung. Interval increase in size of a spiculated nodule of the superior segment right lower lobe, measuring 1.5 x 1.1 cm, previously 0.7 cm when measured similarly (series 7, image 64). Musculoskeletal: No chest wall mass or suspicious bone lesions identified. CT ABDOMEN PELVIS FINDINGS Hepatobiliary: No solid liver abnormality is seen. Fluid attenuation cysts of the left lobe. Status post cholecystectomy. Postoperative biliary ductal dilatation. Pancreas: Unremarkable. No pancreatic ductal dilatation or surrounding inflammatory  changes. Spleen: Normal in size without significant abnormality. Adrenals/Urinary Tract: Adrenal glands are unremarkable. Exophytic cysts of the kidney. Status post cystoprostatectomy with ileal conduit urinary diversion. There is new, moderate right hydronephrosis with an apparent soft tissue attenuation no left-sided hydronephrosis. Filling defect in the mid right ureter proximal to the ileal conduit (series 3, image 64). Stomach/Bowel: Stomach is within normal limits. Appendix appears normal. No evidence of bowel wall thickening, distention, or inflammatory changes. Vascular/Lymphatic: Aortic atherosclerosis. No enlarged abdominal or pelvic lymph nodes. Reproductive: No mass or other abnormality. Other: There is a large right hemiabdominal parastomal hernia containing multiple nonobstructed loops of small bowel. No abdominopelvic ascites. Musculoskeletal: No acute or significant osseous findings. IMPRESSION: 1. Interval increase in size of a spiculated nodule of the superior segment right lower lobe, measuring 1.5 x 1.1 cm, previously 0.7 cm when measured similarly. This is concerning for metachronous primary lung malignancy or metastasis. 2. Redemonstrated postoperative findings of left pneumonectomy with a small volume of chronic, loculated fluid in the left hemithorax. Leftward shift of the mediastinal contents status post pneumonectomy. 3. No specific evidence of metastatic disease in the abdomen or pelvis. 4. There is new, moderate right hydronephrosis with an apparent soft tissue attenuation filling defect of the mid right ureter, possibly reflecting clot material or obstructing soft tissue, generally concerning for malignancy. No left-sided hydronephrosis. 5. Status post cystoprostatectomy with a redemonstrated large right hemiabdominal parastomal hernia containing multiple loops of nonobstructed small bowel. 6. Emphysema (ICD10-J43.9). 7. Coronary artery disease. Aortic Atherosclerosis (ICD10-I70.0).  These results will be called to the ordering clinician or representative by the Radiologist Assistant, and communication documented in the PACS or [employer]. Electronically Signed   By: Eddie Candle M.D.   On: 08/19/2019 17:02   CT Abdomen Pelvis W Contrast  Result Date: 08/19/2019 CLINICAL DATA:  Non-small cell lung cancer staging, status post left pneumonectomy, bladder cancer, status post cysto prostatectomy and ileal conduit urinary diversion EXAM: CT CHEST, ABDOMEN, AND PELVIS WITH CONTRAST TECHNIQUE: Multidetector CT imaging of the chest, abdomen and pelvis was performed following the standard protocol during bolus administration of intravenous contrast. CONTRAST:  167mL OMNIPAQUE IOHEXOL 300 MG/ML SOLN, additional oral enteric contrast COMPARISON:  08/17/2018 FINDINGS: CT CHEST FINDINGS Cardiovascular: Left chest port catheter. Leftward shift of the mediastinal contents status post pneumonectomy. Aortic atherosclerosis. Normal heart size. Scattered three-vessel coronary artery calcifications. No pericardial effusion. Mediastinum/Nodes: No enlarged mediastinal, hilar, or axillary lymph nodes. Thyroid gland, trachea, and esophagus demonstrate no significant findings. Lungs/Pleura: Redemonstrated postoperative findings of left pneumonectomy with a small volume of chronic, loculated fluid in the left hemithorax. Moderate to severe centrilobular emphysema of the remaining right lung. Interval increase in size of a spiculated nodule of the superior segment right lower lobe, measuring 1.5 x  1.1 cm, previously 0.7 cm when measured similarly (series 7, image 64). Musculoskeletal: No chest wall mass or suspicious bone lesions identified. CT ABDOMEN PELVIS FINDINGS Hepatobiliary: No solid liver abnormality is seen. Fluid attenuation cysts of the left lobe. Status post cholecystectomy. Postoperative biliary ductal dilatation. Pancreas: Unremarkable. No pancreatic ductal dilatation or surrounding inflammatory  changes. Spleen: Normal in size without significant abnormality. Adrenals/Urinary Tract: Adrenal glands are unremarkable. Exophytic cysts of the kidney. Status post cystoprostatectomy with ileal conduit urinary diversion. There is new, moderate right hydronephrosis with an apparent soft tissue attenuation no left-sided hydronephrosis. Filling defect in the mid right ureter proximal to the ileal conduit (series 3, image 64). Stomach/Bowel: Stomach is within normal limits. Appendix appears normal. No evidence of bowel wall thickening, distention, or inflammatory changes. Vascular/Lymphatic: Aortic atherosclerosis. No enlarged abdominal or pelvic lymph nodes. Reproductive: No mass or other abnormality. Other: There is a large right hemiabdominal parastomal hernia containing multiple nonobstructed loops of small bowel. No abdominopelvic ascites. Musculoskeletal: No acute or significant osseous findings. IMPRESSION: 1. Interval increase in size of a spiculated nodule of the superior segment right lower lobe, measuring 1.5 x 1.1 cm, previously 0.7 cm when measured similarly. This is concerning for metachronous primary lung malignancy or metastasis. 2. Redemonstrated postoperative findings of left pneumonectomy with a small volume of chronic, loculated fluid in the left hemithorax. Leftward shift of the mediastinal contents status post pneumonectomy. 3. No specific evidence of metastatic disease in the abdomen or pelvis. 4. There is new, moderate right hydronephrosis with an apparent soft tissue attenuation filling defect of the mid right ureter, possibly reflecting clot material or obstructing soft tissue, generally concerning for malignancy. No left-sided hydronephrosis. 5. Status post cystoprostatectomy with a redemonstrated large right hemiabdominal parastomal hernia containing multiple loops of nonobstructed small bowel. 6. Emphysema (ICD10-J43.9). 7. Coronary artery disease. Aortic Atherosclerosis (ICD10-I70.0).  These results will be called to the ordering clinician or representative by the Radiologist Assistant, and communication documented in the PACS or [employer]. Electronically Signed   By: Eddie Candle M.D.   On: 08/19/2019 17:02   NM PET Image Restag (PS) Skull Base To Thigh  Result Date: 09/03/2019 CLINICAL DATA:  Subsequent treatment strategy for non-small cell lung cancer. History of left pneumonectomy. Enlarging right lung lesion. EXAM: NUCLEAR MEDICINE PET SKULL BASE TO THIGH TECHNIQUE: 12.15 mCi F-18 FDG was injected intravenously. Full-ring PET imaging was performed from the skull base to thigh after the radiotracer. CT data was obtained and used for attenuation correction and anatomic localization. Fasting blood glucose: 118 mg/dl COMPARISON:  Chest CT 08/19/2019 FINDINGS: Mediastinal blood pool activity: SUV max 2.96 Liver activity: SUV max NA NECK: No hypermetabolic lymph nodes in the neck. Incidental CT findings: none CHEST: The 15 mm superior segment right lower lobe lung lesion is hypermetabolic with SUV max of 6.31 consistent with neoplasm. This is most likely a metachronous lung cancer. I think metastatic disease is much less likely. No other pulmonary lesions are identified. Mild nodular thickening of the major fissure is stable since 2019 and likely small lymph node. Surgical changes from prior pneumonectomy. No findings suspicious for recurrent tumor in the residual left hemithorax. 6.5 mm precarinal lymph node shows mild hypermetabolism with SUV max of 4.44 and is worrisome for a metastatic lymph node. No definite right hilar adenopathy. 6 mm subcarinal lymph node is mildly hypermetabolic with SUV max of 4.97 Incidental CT findings: Stable advanced emphysematous changes in the right lung. Benign sebaceous cyst noted posterior to the  left shoulder. Stable mild gynecomastia but no chest wall mass, supraclavicular or axillary adenopathy. ABDOMEN/PELVIS: No findings suspicious for  abdominal/pelvic metastatic disease. On the CT scan there is abnormal soft tissue density involving the right mid ureter and associated moderate to marked right-sided hydroureteronephrosis. This is difficult to evaluate on the PET scan and because of the marked activity in the right collecting system in ureter. Findings quite suspicious for ureteral tumor. Recommend urology evaluation/ureteroscopy. Incidental CT findings: Stable surgical changes from a cystectomy and urinary diversion. Large ventral abdominal wall hernia containing small bowel loops and the ileal conduit. Stable atherosclerotic calcifications involving the abdominal aorta and iliac arteries. Stable renal cysts. SKELETON: No findings suspicious for osseous metastatic disease. Incidental CT findings: none IMPRESSION: 1. Superior segment right lower lobe pulmonary lesion is hypermetabolic and consistent with neoplasm. This is most likely a metachronous lung cancer. 2. Precarinal lymph node is hypermetabolic and worrisome for metastatic adenopathy. 3. Status post left-sided pneumonectomy. No findings suspicious for recurrent tumor. 4. No findings for abdominal/pelvic metastatic disease or osseous metastatic disease. 5. 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 This is not well evaluated on the PET scan due to marked activity in the urine. Recommend urology consultation/ureteroscopy. Electronically Signed   By: Marijo Sanes M.D.   On: 09/03/2019 14:08    Labs:  CBC: Recent Labs    08/19/19 1013  WBC 7.2  HGB 12.3*  HCT 37.9*  PLT 185    COAGS: No results for input(s): INR, APTT in the last 8760 hours.  BMP: Recent Labs    08/19/19 1013  NA 141  K 4.8  CL 109  CO2 24  GLUCOSE 112*  BUN 27*  CALCIUM 10.0  CREATININE 1.41*  GFRNONAA 48*  GFRAA 56*    LIVER FUNCTION TESTS: Recent Labs    08/19/19 1013  BILITOT 0.4  AST 15  ALT 11  ALKPHOS 95  PROT 6.9  ALBUMIN 3.4*    TUMOR  MARKERS: No results for input(s): AFPTM, CEA, CA199, CHROMGRNA in the last 8760 hours.  Assessment and Plan:  Hx lung cancer 2006 and 2015 Chemo and Left lung removal Hx bladder cancer 2014 New RLL mass identified-- +PET Scheduled now for right low lobe  mass biopsy Risks and benefits of CT guided lung nodule biopsy was discussed with the patient including, but not limited to bleeding, hemoptysis, respiratory failure requiring intubation, infection, pneumothorax requiring chest tube placement, stroke from air embolism or even death.  All of the patient's questions were answered and the patient is agreeable to proceed. Consent signed and in chart.   Thank you for this interesting consult.  I greatly enjoyed meeting TAVITA EASTHAM and look forward to participating in their care.  A copy of this report was sent to the requesting provider on this date.  Electronically Signed: Lavonia Drafts, PA-C 09/16/2019, 10:08 AM   I spent a total of  30 Minutes   in face to face in clinical consultation, greater than 50% of which was counseling/coordinating care for right lung mass biopsy

## 2019-09-17 LAB — SURGICAL PATHOLOGY

## 2019-09-18 NOTE — Progress Notes (Signed)
Thoracic Location of Tumor / Histology: RLL Lung  Recurrent non-small cell lung cancer, LLL Lung   PET 09/03/2019: Superior segment right lower lobe pulmonary lesion is hypermetabolic and consistent with neoplasm. This is most likely a metachronous lung cancer.  Precarinal lymph node is hypermetabolic and worrisome for metastatic adenopathy.  Status post left-sided pneumonectomy. No findings suspicious for recurrent tumor.  No findings for abdominal/pelvic metastatic disease or osseous metastatic disease.  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 This is not well evaluated on the PET scan due to marked activity in the urine.  CT CAP 08/19/2019: Interval increase in size of a spiculated nodule of the superior segment right lower lobe, measuring 1.5 x 1.1 cm, previously 0.7 cm when measured similarly. This is concerning for metachronous primary lung malignancy or metastasis.  Redemonstrated postoperative findings of left pneumonectomy with a small volume of chronic, loculated fluid in the left hemithorax.  No specific evidence of metastatic disease in the abdomen or pelvis.  There is new, moderate right hydronephrosis with an apparent soft tissue attenuation filling defect of the mid right ureter, possibly reflecting clot material or obstructing soft tissue, generally concerning for malignancy.   Biopsies of Right Lower Lobe 09/16/2019   Tobacco/Marijuana/Snuff/ETOH use:   Past/Anticipated interventions by cardiothoracic surgery, if any:  -Left upper lobectomy 10/02/2014 -Redo thoracotomy with complete left pneumonectomy 03/05/2014 -Cystectomy 09/01/2014  Past/Anticipated interventions by medical oncology, if any:  Dr. Julien Nordmann 09/04/2019 -Recent CT scan of the chest showed further increase in the size of the right lower lobe pulmonary nodule suspicious for metachronous lung cancer.  I ordered a PET scan that confirms hypermetabolic activity and the right lower lobe lung  nodule but there was very small precarinal and subcarinal lymphadenopathy suspicious for metastatic adenopathy. -I recommended for the patient to have CT-guided core biopsy of the left lower lobe lung nodule for confirmation of the tissue diagnosis. -I also referred the patient to radiation oncology for evaluation and consideration of SRS to the left lower lobe lung mass.  recent diagnosis of bladder cancer: Status post resection at [facility].he is currently followed by Dr.Tsivinn at [facility].  The scan showed new moderate right hydronephrosis with an apparent soft tissue attenuation filling defect of the mid right ureter.  The patient is scheduled to see his urologist on September 05, 2019.   Signs/Symptoms  Weight changes, if any: No  Respiratory complaints, if any: Wearing 2 Liters oxygen  Hemoptysis, if any: No  Pain issues, if any:  None  SAFETY ISSUES:  Prior radiation? No  Pacemaker/ICD? No  Possible current pregnancy? n/a  Is the patient on methotrexate? No  Current Complaints / other details:   -Has Port  History 1) new right lower lobe pulmonary nodule suspicious for metachronous lung cancer. 2) Recurrent non-small cell lung cancer initially diagnosed as stage IIIA September 2006.  2) diagnosis of early stage bladder cancer in 2014: Status post resection followed by 6 months of intravesical BCG. 3) He has recurrent hematuria. Repeat cystoscopy and biopsy 01/16/2013: CIS, normal RPGs. CT scan abdomen: bilateral renal cysts. No adenopathy. Cystoscopy and biopsy December 2015: Positive cytologies 4) recurrent non-small cell lung cancer, adenocarcinoma involving the left lower lobe diagnosed in November 2015. PRIOR THERAPY:  1. Status post 3 cycles of neoadjuvant chemotherapy with carboplatin and docetaxel, last dose was given November 22, 2004. 2. Status post left upper lobectomy with lymph node dissection under the care of Dr. Arlyce Dice on  January 11, 2005. 3. Status post pericardial window on February 02, 2005 for evacuation of postoperative pericardial tamponade and the fluid was negative for malignancy. 4. Status post 3 cycles of adjuvant chemotherapy with carboplatin and gemcitabine. Last dose was given May 13, 2005. 5. Status post 5 cycles of systemic chemotherapy with carboplatin, paclitaxel and Avastin for disease recurrence. Last dose was given January 04, 2006 and the patient had stable disease by the end of the last cycle. 6. Status post maintenance treatment with Avastin 15 mg/kg given every 3 weeks. The patient is status post 42 cycles, discontinued on July 02, 2008 after the patient had stable disease for more than 2 years. 7. Status post Bilateral selective ureteral cytology, Bilateral retrograde pyelography, Transurethral resection of bladder tumor, Random bladder biopsies, Prostatic urethral biopsy and Transrectal biopsy of the prostate under the care of Dr. Rutherford Limerick at [facility]. 8. Status post redo thoracotomy with left pneumonectomy under the care of Dr. Lianne Moris at [facility] on 03/05/2014.

## 2019-09-19 ENCOUNTER — Ambulatory Visit
Admission: RE | Admit: 2019-09-19 | Discharge: 2019-09-19 | Disposition: A | Discharge status: Home / Self Care | Payer: Medicare Other | Source: Ambulatory Visit | Attending: Radiation Oncology | Admitting: Radiation Oncology

## 2019-09-19 ENCOUNTER — Encounter: Payer: Self-pay | Admitting: Radiation Oncology

## 2019-09-19 ENCOUNTER — Other Ambulatory Visit: Payer: Self-pay

## 2019-09-19 VITALS — BP 133/60 | HR 65 | Temp 97.4°F | Resp 20 | Ht 70.0 in | Wt 246.0 lb

## 2019-09-19 DIAGNOSIS — Z85118 Personal history of other malignant neoplasm of bronchus and lung: Secondary | ICD-10-CM | POA: Diagnosis not present

## 2019-09-19 DIAGNOSIS — G629 Polyneuropathy, unspecified: Secondary | ICD-10-CM | POA: Insufficient documentation

## 2019-09-19 DIAGNOSIS — K219 Gastro-esophageal reflux disease without esophagitis: Secondary | ICD-10-CM | POA: Diagnosis not present

## 2019-09-19 DIAGNOSIS — Z923 Personal history of irradiation: Secondary | ICD-10-CM | POA: Insufficient documentation

## 2019-09-19 DIAGNOSIS — I10 Essential (primary) hypertension: Secondary | ICD-10-CM | POA: Insufficient documentation

## 2019-09-19 DIAGNOSIS — R918 Other nonspecific abnormal finding of lung field: Secondary | ICD-10-CM | POA: Insufficient documentation

## 2019-09-19 DIAGNOSIS — Z8551 Personal history of malignant neoplasm of bladder: Secondary | ICD-10-CM | POA: Insufficient documentation

## 2019-09-19 DIAGNOSIS — E785 Hyperlipidemia, unspecified: Secondary | ICD-10-CM | POA: Diagnosis not present

## 2019-09-19 DIAGNOSIS — Z87891 Personal history of nicotine dependence: Secondary | ICD-10-CM | POA: Insufficient documentation

## 2019-09-19 DIAGNOSIS — Z7982 Long term (current) use of aspirin: Secondary | ICD-10-CM | POA: Diagnosis not present

## 2019-09-19 DIAGNOSIS — C3432 Malignant neoplasm of lower lobe, left bronchus or lung: Secondary | ICD-10-CM

## 2019-09-19 DIAGNOSIS — Z79899 Other long term (current) drug therapy: Secondary | ICD-10-CM | POA: Insufficient documentation

## 2019-09-19 DIAGNOSIS — C3431 Malignant neoplasm of lower lobe, right bronchus or lung: Secondary | ICD-10-CM

## 2019-09-20 NOTE — Progress Notes (Signed)
Radiation Oncology         [phone] ________________________________  Name: Wesley Harmon        MRN: [id number]  Date of Service: 09/19/2019 DOB: 02-Mar-1945  AO:[id number], Wesley Forth, MD  Wesley Bears, MD     REFERRING PHYSICIAN: Curt Bears, MD   DIAGNOSIS: The encounter diagnosis was Primary cancer of left lower lobe of lung ([place]).   HISTORY OF PRESENT ILLNESS: Wesley Harmon is a 74 y.o. male seen at the request of Dr. Julien Harmon for a possible stage I lung cancer within the right lower lobe.  The patient has a history of a stage IIIb non-small cell lung cancer that was treated with neoadjuvant chemotherapy and subsequent resection of the left upper lobe in December 2006.  He also has a history of a bladder cancer diagnosed in 2013 status post resected resection followed by 6 months of BCG, but underwent cystectomy with ureteroileal conduit in August 2016.  The patient had also undergone a redo thoracotomy with left pneumonectomy in February 2016.  He has been followed in surveillance.  Recent CT scans have identified a new area in the right lower lobe posteriorly.  PET scan on 09/03/2019 showed hypermetabolism with an SUV of 8.42 in the 15 mm superior segment of the right lower lobe.  There was a 6.5 mm precarinal lymph node that showed hypermetabolism with an SUV of 4.4 without definitive right hilar adenopathy.  A subcarinal lymph node was mildly hypermetabolic with SUV of 6.96.  Given these findings he underwent biopsy of the right lower lobe nodule under CT guidance.  Final pathology on 09/16/2019 revealed lung parenchyma with inflammation and reactive change without evidence of disease.  He is seen today to discuss options of stereotactic body radiotherapy to the site, Dr. Julien Harmon was planning to follow the lymph nodes in the chest expectantly.     PREVIOUS RADIATION THERAPY: No   PAST MEDICAL HISTORY:  Past Medical History:  Diagnosis Date  . Bladder cancer ([place])  01/02/13  . GERD (gastroesophageal reflux disease)   . Hyperlipemia   . Hypertension   . lung ca dx'd 07/2004   chemo comp 06/2008  . Lung cancer ([place])   . Neuropathy        PAST SURGICAL HISTORY: Past Surgical History:  Procedure Laterality Date  . arm surgery    . CARDIAC SURGERY    . HERNIA REPAIR       FAMILY HISTORY: History reviewed. No pertinent family history.   SOCIAL HISTORY:  reports that he quit smoking about 14 years ago. He has a 162.00 pack-year smoking history. He has never used smokeless tobacco.   ALLERGIES: Dilaudid [hydromorphone hcl]   MEDICATIONS:  Current Outpatient Medications  Medication Sig Dispense Refill  . amLODipine (NORVASC) 10 MG tablet Take 10 mg by mouth daily.    Marland Kitchen aspirin EC 81 MG tablet Take 81 mg by mouth.    . Cholecalciferol (VITAMIN D3) 1000 units CAPS Take 1,000 mg by mouth daily.    . cyanocobalamin 1000 MCG tablet Take 1,000 mcg by mouth daily.    Marland Kitchen gabapentin (NEURONTIN) 300 MG capsule Take 300 mg by mouth 3 (three) times daily.    Marland Kitchen levothyroxine (SYNTHROID) 50 MCG tablet Take 1 tablet by mouth daily.    Marland Kitchen lisinopril (ZESTRIL) 10 MG tablet Take 20 mg by mouth daily.    . Multiple Vitamins-Minerals (MULTIVITAMIN WITH MINERALS) tablet Take 1 tablet by mouth daily.    Marland Kitchen omeprazole (PRILOSEC) 20  MG capsule Take 20 mg by mouth daily.    . polyethylene glycol (MIRALAX / GLYCOLAX) packet Take 17 g by mouth daily.    Marland Kitchen umeclidinium bromide (INCRUSE ELLIPTA) 62.5 MCG/INH AEPB Inhale 1 puff into the lungs daily.    . Albuterol Sulfate 108 (90 Base) MCG/ACT AEPB Inhale 2 puffs into the lungs every 6 (six) hours as needed.  (Patient not taking: Reported on 09/19/2019)    . atorvastatin (LIPITOR) 40 MG tablet TAKE 1 TABLET BY MOUTH ONCE DAILY FOR CHOLESTEROL    . BREO ELLIPTA 100-25 MCG/INH AEPB 1 puff daily.     No current facility-administered medications for this encounter.     REVIEW OF SYSTEMS: On review of systems, the patient  reports that he is doing well.  He uses oxygen for exertional activity but since his biopsy states that he is using it more regularly.  He states that he is not short of breath at rest, and denies any chest pain or fevers or chills.  He does have an occasional dry cough.  No other complaints are verbalized.     PHYSICAL EXAM:  Wt Readings from Last 3 Encounters:  09/19/19 246 lb (111.6 kg)  09/04/19 246 lb 9.6 oz (111.9 kg)  08/21/19 247 lb 3.2 oz (112.1 kg)   Temp Readings from Last 3 Encounters:  09/19/19 (!) 97.4 F (36.3 C) (Temporal)  09/04/19 (!) 96.4 F (35.8 C) (Tympanic)  08/21/19 (!) 97 F (36.1 C) (Temporal)   BP Readings from Last 3 Encounters:  09/19/19 133/60  09/16/19 121/65  09/04/19 127/64   Pulse Readings from Last 3 Encounters:  09/19/19 65  09/16/19 89  09/04/19 60   Pain Assessment Pain Score: 0-No pain/10  In general this is a well appearing Caucasian male in no acute distress.  He's alert and oriented x4 and appropriate throughout the examination. Cardiopulmonary assessment is negative for acute distress and he exhibits normal effort.    ECOG = 1  0 - Asymptomatic (Fully active, able to carry on all predisease activities without restriction)  1 - Symptomatic but completely ambulatory (Restricted in physically strenuous activity but ambulatory and able to carry out work of a light or sedentary nature. For example, light housework, office work)  2 - Symptomatic, <50% in bed during the day (Ambulatory and capable of all self care but unable to carry out any work activities. Up and about more than 50% of waking hours)  3 - Symptomatic, >50% in bed, but not bedbound (Capable of only limited self-care, confined to bed or chair 50% or more of waking hours)  4 - Bedbound (Completely disabled. Cannot carry on any self-care. Totally confined to bed or chair)  5 - Death   Eustace Pen MM, Creech RH, Tormey DC, et al. [phone]). "Toxicity and response criteria of the  [facility]". [place] Oncol. 5 (6): 649-55    LABORATORY DATA:  Lab Results  Component Value Date   WBC 6.4 09/16/2019   HGB 11.3 (L) 09/16/2019   HCT 35.9 (L) 09/16/2019   MCV 97.0 09/16/2019   PLT 201 09/16/2019   Lab Results  Component Value Date   NA 141 08/19/2019   K 4.8 08/19/2019   CL 109 08/19/2019   CO2 24 08/19/2019   Lab Results  Component Value Date   ALT 11 08/19/2019   AST 15 08/19/2019   ALKPHOS 95 08/19/2019   BILITOT 0.4 08/19/2019      RADIOGRAPHY: NM PET Image  Restag (PS) Skull Base To Thigh  Result Date: 09/03/2019 CLINICAL DATA:  Subsequent treatment strategy for non-small cell lung cancer. History of left pneumonectomy. Enlarging right lung lesion. EXAM: NUCLEAR MEDICINE PET SKULL BASE TO THIGH TECHNIQUE: 12.15 mCi F-18 FDG was injected intravenously. Full-ring PET imaging was performed from the skull base to thigh after the radiotracer. CT data was obtained and used for attenuation correction and anatomic localization. Fasting blood glucose: 118 mg/dl COMPARISON:  Chest CT 08/19/2019 FINDINGS: Mediastinal blood pool activity: SUV max 2.96 Liver activity: SUV max NA NECK: No hypermetabolic lymph nodes in the neck. Incidental CT findings: none CHEST: The 15 mm superior segment right lower lobe lung lesion is hypermetabolic with SUV max of 1.51 consistent with neoplasm. This is most likely a metachronous lung cancer. I think metastatic disease is much less likely. No other pulmonary lesions are identified. Mild nodular thickening of the major fissure is stable since 2019 and likely small lymph node. Surgical changes from prior pneumonectomy. No findings suspicious for recurrent tumor in the residual left hemithorax. 6.5 mm precarinal lymph node shows mild hypermetabolism with SUV max of 4.44 and is worrisome for a metastatic lymph node. No definite right hilar adenopathy. 6 mm subcarinal lymph node is mildly hypermetabolic with SUV max  of 3.34 Incidental CT findings: Stable advanced emphysematous changes in the right lung. Benign sebaceous cyst noted posterior to the left shoulder. Stable mild gynecomastia but no chest wall mass, supraclavicular or axillary adenopathy. ABDOMEN/PELVIS: No findings suspicious for abdominal/pelvic metastatic disease. On the CT scan there is abnormal soft tissue density involving the right mid ureter and associated moderate to marked right-sided hydroureteronephrosis. This is difficult to evaluate on the PET scan and because of the marked activity in the right collecting system in ureter. Findings quite suspicious for ureteral tumor. Recommend urology evaluation/ureteroscopy. Incidental CT findings: Stable surgical changes from a cystectomy and urinary diversion. Large ventral abdominal wall hernia containing small bowel loops and the ileal conduit. Stable atherosclerotic calcifications involving the abdominal aorta and iliac arteries. Stable renal cysts. SKELETON: No findings suspicious for osseous metastatic disease. Incidental CT findings: none IMPRESSION: 1. Superior segment right lower lobe pulmonary lesion is hypermetabolic and consistent with neoplasm. This is most likely a metachronous lung cancer. 2. Precarinal lymph node is hypermetabolic and worrisome for metastatic adenopathy. 3. Status post left-sided pneumonectomy. No findings suspicious for recurrent tumor. 4. No findings for abdominal/pelvic metastatic disease or osseous metastatic disease. 5. 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 This is not well evaluated on the PET scan due to marked activity in the urine. Recommend urology consultation/ureteroscopy. Electronically Signed   By: Marijo Sanes M.D.   On: 09/03/2019 14:08   CT Biopsy  Result Date: 09/16/2019 INDICATION: PET positive right lower lobe nodule. EXAM: CT-GUIDED BIOPSY RIGHT LOWER LOBE NODULE MEDICATIONS: 1% LIDOCAINE LOCAL ANESTHESIA/SEDATION: 1.5 mg IV  Versed; 75 mcg IV Fentanyl Moderate Sedation Time:  16 minutes The patient was continuously monitored during the procedure by the interventional radiology nurse under my direct supervision. PROCEDURE: The procedure, risks, benefits, and alternatives were explained to the patient. Questions regarding the procedure were encouraged and answered. The patient understands and consents to the procedure. Previous imaging reviewed. Patient positioned left side down decubitus. Noncontrast localization CT performed. The right lower lobe peripheral nodule is localized and marked. This correlate with the PET-CT. Under sterile conditions and local anesthesia, a 17 gauge coaxial guide was advanced to the nodule under CT. Needle position confirmed with  CT. 3 18 gauge 1 cm core biopsies obtained. Samples were small and fragmented. Additional sampling not performed because acute right lower lobe pulmonary hemorrhage. Despite this, the patient tolerated the procedure well. Needle tract occluded with the bio sentry device. Postprocedure imaging demonstrates no effusion or pneumothorax. Vital sign monitoring by nursing staff during the procedure will continue as patient is in the special procedures unit for post procedure observation. FINDINGS: The images document guide needle placement within the right lower lobe peripheral nodule. Post biopsy images demonstrate acute right lower lobe pulmonary hemorrhage related to the biopsy. COMPLICATIONS: SIR Level A - No therapy, no consequence. IMPRESSION: Successful CT-guided core biopsy of the peripheral right lower lobe PET positive nodule Electronically Signed   By: Jerilynn Mages.  Shick M.D.   On: 09/16/2019 12:42   DG Chest Port 1 View  Result Date: 09/16/2019 CLINICAL DATA:  Status post right lung biopsy for mass EXAM: PORTABLE CHEST 1 VIEW COMPARISON:  09/16/2019, 05/27/2019 FINDINGS: Single frontal view of the chest demonstrates stable postsurgical changes from left pneumonectomy. Airspace  disease at the right lung base consistent with post biopsy hemorrhage after previous CT-guided right lower lobe lung biopsy. No significant change since the postprocedural CT images. No effusion or pneumothorax. Stable left chest wall port. IMPRESSION: 1. Stable post biopsy hemorrhage within the right lower lobe. 2. No evidence of pneumothorax. Electronically Signed   By: Randa Ngo M.D.   On: 09/16/2019 23:44       IMPRESSION/PLAN: 1. Putative Stage IA2, cT1bN0M0, NSCLC of the RLL. Dr. Lisbeth Renshaw discusses the pathology findings and reviews the nature of early stage lung cancer.  He reviews the rationale to discuss the patient's case at our upcoming multidisciplinary thoracic oncology conference next Thursday.  Given the findings in the lung and suspicion given his longstanding history of disease as well as risks of recurrence, and makes most sense that this area in the right lower lobe would be malignant in nature however the biopsy was not representative of this.  While Dr. Lisbeth Renshaw would like to offer stereotactic body radiotherapy to the right lower lobe nodule, he would like to make sure that the patient's case is discussed to rule out the need for resampling.  Provided that the group is in agreement or that pulmonary does not think that he would be a good candidate given his comorbidities, we would like to proceed with simulation next Thursday afternoon.  The patient is in agreement with this plan.. We discussed the risks, benefits, short, and long term effects of radiotherapy, and the patient is interested in proceeding. Dr. Lisbeth Renshaw discusses the delivery and logistics of radiotherapy and anticipates a course of 3-5 fractions of treatment.  We will reach out to the patient next Thursday to confirm our final plans.Written consent is obtained and placed in the chart, a copy was provided to the patient. 2. History of stage III lung cancer.  The patient will continue to follow-up with Dr. Julien Harmon in  surveillance. 3. History of urothelial carcinoma.  The patient continues to follow-up with his urologist for long-term follow-up as well.  In a visit lasting 60 minutes, greater than 50% of the time was spent face to face discussing the patient's condition, in preparation for the discussion, and coordinating the patient's care.  The above documentation reflects my direct findings during this shared patient visit. Please see the separate note by Dr. Lisbeth Renshaw on this date for the remainder of the patient's plan of care.    Carola Rhine,  PAC

## 2019-09-26 ENCOUNTER — Ambulatory Visit: Payer: Medicare Other | Admitting: Radiation Oncology

## 2019-09-26 ENCOUNTER — Other Ambulatory Visit: Payer: Self-pay | Admitting: *Deleted

## 2019-09-26 ENCOUNTER — Telehealth: Payer: Self-pay | Admitting: Radiation Oncology

## 2019-09-26 DIAGNOSIS — C3432 Malignant neoplasm of lower lobe, left bronchus or lung: Secondary | ICD-10-CM

## 2019-09-26 NOTE — Telephone Encounter (Signed)
I called and spoke with the patient this morning to review our discussion from thoracic oncology conference this morning. The features of his findings on PET are suspicious for possible new disease in the right lung. That being said, the area is small and resampling would be difficult, so rather than proceeding directly to SBRT, the recommendation was to proceed with repeat CT in 3 months time, and at that point if progressive changes were seen, reconsider moving forward with SBRT. The patient is in agreement with this plan, and orders for repeat imaging were placed.

## 2019-09-26 NOTE — Progress Notes (Signed)
The proposed treatment discussed in cancer conference is for discussion purpose only and is not a binding recommendation.  The patient have not been physically examined or presented with their treatment options.  Therefore, final treatment plans cannot be decided.

## 2019-10-08 ENCOUNTER — Ambulatory Visit: Payer: Medicare Other | Admitting: Radiation Oncology

## 2019-10-09 ENCOUNTER — Ambulatory Visit: Payer: Medicare Other | Admitting: Radiation Oncology

## 2019-10-10 ENCOUNTER — Ambulatory Visit: Payer: Medicare Other | Admitting: Radiation Oncology

## 2019-10-15 ENCOUNTER — Ambulatory Visit: Payer: Medicare Other | Admitting: Radiation Oncology

## 2019-10-16 ENCOUNTER — Inpatient Hospital Stay: Payer: Medicare Other

## 2019-10-16 ENCOUNTER — Other Ambulatory Visit: Payer: Self-pay

## 2019-10-16 ENCOUNTER — Inpatient Hospital Stay: Payer: Medicare Other | Attending: Internal Medicine

## 2019-10-16 DIAGNOSIS — Z452 Encounter for adjustment and management of vascular access device: Secondary | ICD-10-CM | POA: Insufficient documentation

## 2019-10-16 DIAGNOSIS — C3432 Malignant neoplasm of lower lobe, left bronchus or lung: Secondary | ICD-10-CM | POA: Diagnosis present

## 2019-10-16 DIAGNOSIS — Z95828 Presence of other vascular implants and grafts: Secondary | ICD-10-CM

## 2019-10-16 MED ORDER — HEPARIN SOD (PORK) LOCK FLUSH 100 UNIT/ML IV SOLN
500.0000 [IU] | Freq: Once | INTRAVENOUS | Status: AC
  Administered 2019-10-16: 500 [IU]
  Filled 2019-10-16: qty 5

## 2019-10-16 MED ORDER — SODIUM CHLORIDE 0.9% FLUSH
10.0000 mL | Freq: Once | INTRAVENOUS | Status: AC
  Administered 2019-10-16: 10 mL
  Filled 2019-10-16: qty 10

## 2019-10-16 NOTE — Patient Instructions (Signed)

## 2019-12-11 ENCOUNTER — Other Ambulatory Visit: Payer: Medicare Other

## 2019-12-11 ENCOUNTER — Inpatient Hospital Stay: Payer: Medicare Other

## 2019-12-25 ENCOUNTER — Encounter: Payer: Self-pay | Admitting: Radiation Oncology

## 2019-12-25 ENCOUNTER — Other Ambulatory Visit: Payer: Self-pay

## 2019-12-25 ENCOUNTER — Telehealth: Payer: Self-pay

## 2019-12-25 NOTE — Telephone Encounter (Signed)
Spoke with patient in regards to telephone visit for results with Shona Simpson PA on 12/30/19 @ 1:00pm. Patient verbalized understanding of appointment date and time. Meaningful use questions were reviewed. TM

## 2019-12-30 ENCOUNTER — Ambulatory Visit
Admission: RE | Admit: 2019-12-30 | Discharge: 2019-12-30 | Disposition: A | Discharge status: Home / Self Care | Payer: Medicare Other | Source: Ambulatory Visit | Attending: Radiation Oncology | Admitting: Radiation Oncology

## 2019-12-30 DIAGNOSIS — C3432 Malignant neoplasm of lower lobe, left bronchus or lung: Secondary | ICD-10-CM

## 2020-01-01 ENCOUNTER — Telehealth: Payer: Self-pay | Admitting: Medical Oncology

## 2020-01-01 ENCOUNTER — Telehealth: Payer: Self-pay

## 2020-01-01 ENCOUNTER — Encounter: Payer: Self-pay | Admitting: Radiation Oncology

## 2020-01-01 ENCOUNTER — Other Ambulatory Visit: Payer: Self-pay

## 2020-01-01 NOTE — Telephone Encounter (Signed)
Please call Dr Gerarda Fraction regarding Wesley Harmon's  AKI d/t R sided hydronephrosis. Wants to postpone his CT scan.

## 2020-01-01 NOTE — Telephone Encounter (Signed)
Called patient in regards to telephone visit appointment with Shona Simpson PA on 01/07/20 @ 8:30am. Patient verbalized understanding of appointment date and time. Reviewed meaningful use questions.TM

## 2020-01-02 ENCOUNTER — Ambulatory Visit (HOSPITAL_COMMUNITY): Payer: Medicare Other

## 2020-01-02 ENCOUNTER — Other Ambulatory Visit: Payer: Self-pay | Admitting: Internal Medicine

## 2020-01-02 DIAGNOSIS — C3490 Malignant neoplasm of unspecified part of unspecified bronchus or lung: Secondary | ICD-10-CM

## 2020-01-02 NOTE — Telephone Encounter (Signed)
Please make sure they cancel his CT scan of the chest, abdomen pelvis with contrast scheduled for tomorrow January 03, 2020.  I replaced it with another scan without contrast because of the recent kidney injury.  Thank you.

## 2020-01-03 ENCOUNTER — Ambulatory Visit (HOSPITAL_COMMUNITY): Payer: Medicare Other

## 2020-01-03 ENCOUNTER — Inpatient Hospital Stay: Payer: Medicare Other

## 2020-01-03 ENCOUNTER — Other Ambulatory Visit: Payer: Self-pay

## 2020-01-03 ENCOUNTER — Inpatient Hospital Stay: Payer: Medicare Other | Attending: Internal Medicine

## 2020-01-03 ENCOUNTER — Ambulatory Visit (HOSPITAL_COMMUNITY)
Admission: RE | Admit: 2020-01-03 | Discharge: 2020-01-03 | Disposition: A | Discharge status: Home / Self Care | Payer: Medicare Other | Source: Ambulatory Visit | Attending: Internal Medicine | Admitting: Internal Medicine

## 2020-01-03 DIAGNOSIS — I700 Atherosclerosis of aorta: Secondary | ICD-10-CM | POA: Insufficient documentation

## 2020-01-03 DIAGNOSIS — J439 Emphysema, unspecified: Secondary | ICD-10-CM | POA: Diagnosis not present

## 2020-01-03 DIAGNOSIS — Z95828 Presence of other vascular implants and grafts: Secondary | ICD-10-CM

## 2020-01-03 DIAGNOSIS — M549 Dorsalgia, unspecified: Secondary | ICD-10-CM | POA: Insufficient documentation

## 2020-01-03 DIAGNOSIS — N281 Cyst of kidney, acquired: Secondary | ICD-10-CM | POA: Insufficient documentation

## 2020-01-03 DIAGNOSIS — C678 Malignant neoplasm of overlapping sites of bladder: Secondary | ICD-10-CM | POA: Diagnosis not present

## 2020-01-03 DIAGNOSIS — K435 Parastomal hernia without obstruction or  gangrene: Secondary | ICD-10-CM | POA: Insufficient documentation

## 2020-01-03 DIAGNOSIS — R5383 Other fatigue: Secondary | ICD-10-CM | POA: Diagnosis not present

## 2020-01-03 DIAGNOSIS — N029 Recurrent and persistent hematuria with unspecified morphologic changes: Secondary | ICD-10-CM | POA: Diagnosis not present

## 2020-01-03 DIAGNOSIS — Z79899 Other long term (current) drug therapy: Secondary | ICD-10-CM | POA: Diagnosis not present

## 2020-01-03 DIAGNOSIS — C3432 Malignant neoplasm of lower lobe, left bronchus or lung: Secondary | ICD-10-CM

## 2020-01-03 DIAGNOSIS — C3490 Malignant neoplasm of unspecified part of unspecified bronchus or lung: Secondary | ICD-10-CM

## 2020-01-03 DIAGNOSIS — M47814 Spondylosis without myelopathy or radiculopathy, thoracic region: Secondary | ICD-10-CM | POA: Diagnosis not present

## 2020-01-03 DIAGNOSIS — K7689 Other specified diseases of liver: Secondary | ICD-10-CM | POA: Diagnosis not present

## 2020-01-03 DIAGNOSIS — M47816 Spondylosis without myelopathy or radiculopathy, lumbar region: Secondary | ICD-10-CM | POA: Diagnosis not present

## 2020-01-03 DIAGNOSIS — J90 Pleural effusion, not elsewhere classified: Secondary | ICD-10-CM | POA: Insufficient documentation

## 2020-01-03 DIAGNOSIS — R0609 Other forms of dyspnea: Secondary | ICD-10-CM | POA: Insufficient documentation

## 2020-01-03 DIAGNOSIS — E041 Nontoxic single thyroid nodule: Secondary | ICD-10-CM | POA: Insufficient documentation

## 2020-01-03 DIAGNOSIS — Z9079 Acquired absence of other genital organ(s): Secondary | ICD-10-CM | POA: Diagnosis not present

## 2020-01-03 DIAGNOSIS — Z9049 Acquired absence of other specified parts of digestive tract: Secondary | ICD-10-CM | POA: Insufficient documentation

## 2020-01-03 DIAGNOSIS — Z885 Allergy status to narcotic agent status: Secondary | ICD-10-CM | POA: Diagnosis not present

## 2020-01-03 DIAGNOSIS — Z936 Other artificial openings of urinary tract status: Secondary | ICD-10-CM | POA: Insufficient documentation

## 2020-01-03 LAB — CBC WITH DIFFERENTIAL (CANCER CENTER ONLY)
Abs Immature Granulocytes: 0.02 10*3/uL (ref 0.00–0.07)
Basophils Absolute: 0.1 10*3/uL (ref 0.0–0.1)
Basophils Relative: 1 %
Eosinophils Absolute: 0.2 10*3/uL (ref 0.0–0.5)
Eosinophils Relative: 3 %
HCT: 33.7 % — ABNORMAL LOW (ref 39.0–52.0)
Hemoglobin: 11.1 g/dL — ABNORMAL LOW (ref 13.0–17.0)
Immature Granulocytes: 0 %
Lymphocytes Relative: 18 %
Lymphs Abs: 1.4 10*3/uL (ref 0.7–4.0)
MCH: 31.4 pg (ref 26.0–34.0)
MCHC: 32.9 g/dL (ref 30.0–36.0)
MCV: 95.2 fL (ref 80.0–100.0)
Monocytes Absolute: 0.6 10*3/uL (ref 0.1–1.0)
Monocytes Relative: 8 %
Neutro Abs: 5.5 10*3/uL (ref 1.7–7.7)
Neutrophils Relative %: 70 %
Platelet Count: 183 10*3/uL (ref 150–400)
RBC: 3.54 MIL/uL — ABNORMAL LOW (ref 4.22–5.81)
RDW: 13.8 % (ref 11.5–15.5)
WBC Count: 7.8 10*3/uL (ref 4.0–10.5)
nRBC: 0 % (ref 0.0–0.2)

## 2020-01-03 LAB — CMP (CANCER CENTER ONLY)
ALT: 12 U/L (ref 0–44)
AST: 18 U/L (ref 15–41)
Albumin: 3.4 g/dL — ABNORMAL LOW (ref 3.5–5.0)
Alkaline Phosphatase: 86 U/L (ref 38–126)
Anion gap: 7 (ref 5–15)
BUN: 23 mg/dL (ref 8–23)
CO2: 24 mmol/L (ref 22–32)
Calcium: 9.2 mg/dL (ref 8.9–10.3)
Chloride: 107 mmol/L (ref 98–111)
Creatinine: 1.39 mg/dL — ABNORMAL HIGH (ref 0.61–1.24)
GFR, Estimated: 53 mL/min — ABNORMAL LOW (ref >60)
Glucose, Bld: 105 mg/dL — ABNORMAL HIGH (ref 70–99)
Potassium: 4.2 mmol/L (ref 3.5–5.1)
Sodium: 138 mmol/L (ref 135–145)
Total Bilirubin: 0.3 mg/dL (ref 0.3–1.2)
Total Protein: 6.9 g/dL (ref 6.5–8.1)

## 2020-01-03 MED ORDER — HEPARIN SOD (PORK) LOCK FLUSH 100 UNIT/ML IV SOLN
500.0000 [IU] | Freq: Once | INTRAVENOUS | Status: AC | PRN
  Administered 2020-01-03: 500 [IU] via INTRAVENOUS
  Filled 2020-01-03: qty 5

## 2020-01-03 MED ORDER — IOHEXOL 9 MG/ML PO SOLN
ORAL | Status: AC
  Filled 2020-01-03: qty 500

## 2020-01-03 MED ORDER — SODIUM CHLORIDE 0.9% FLUSH
10.0000 mL | Freq: Once | INTRAVENOUS | Status: AC
  Administered 2020-01-03: 10 mL
  Filled 2020-01-03: qty 10

## 2020-01-03 MED ORDER — IOHEXOL 9 MG/ML PO SOLN
1000.0000 mL | ORAL | Status: AC
  Administered 2020-01-03: 1000 mL via ORAL

## 2020-01-03 MED ORDER — IOHEXOL 9 MG/ML PO SOLN
ORAL | Status: AC
  Filled 2020-01-03: qty 1000

## 2020-01-06 ENCOUNTER — Other Ambulatory Visit: Payer: Self-pay

## 2020-01-06 ENCOUNTER — Ambulatory Visit: Payer: Medicare Other | Admitting: Radiation Oncology

## 2020-01-06 ENCOUNTER — Inpatient Hospital Stay (HOSPITAL_BASED_OUTPATIENT_CLINIC_OR_DEPARTMENT_OTHER): Payer: Medicare Other | Admitting: Internal Medicine

## 2020-01-06 ENCOUNTER — Encounter: Payer: Self-pay | Admitting: Internal Medicine

## 2020-01-06 VITALS — BP 138/67 | HR 72 | Temp 97.0°F | Resp 18 | Ht 70.0 in | Wt 241.5 lb

## 2020-01-06 DIAGNOSIS — C678 Malignant neoplasm of overlapping sites of bladder: Secondary | ICD-10-CM | POA: Diagnosis not present

## 2020-01-06 DIAGNOSIS — C3490 Malignant neoplasm of unspecified part of unspecified bronchus or lung: Secondary | ICD-10-CM | POA: Diagnosis not present

## 2020-01-06 DIAGNOSIS — C3432 Malignant neoplasm of lower lobe, left bronchus or lung: Secondary | ICD-10-CM | POA: Diagnosis not present

## 2020-01-06 NOTE — Progress Notes (Signed)
[place] [phone]) [phone]   Fax:(336) [place], [place] [address] [place] [address]  PRINCIPAL DIAGNOSIS:  1) new right lower lobe pulmonary nodule suspicious for metachronous lung cancer. 2) Recurrent non-small cell lung cancer initially diagnosed as stage IIIA September 2006.  2) diagnosis of early stage bladder cancer in 2014: Status post resection followed by 6 months of intravesical BCG. 3) He has recurrent hematuria. Repeat cystoscopy and biopsy 01/16/2013: CIS, normal RPGs. CT scan abdomen: bilateral renal cysts. No adenopathy. Cystoscopy and biopsy December 2015: Positive cytologies 4) recurrent non-small cell lung cancer, adenocarcinoma involving the left lower lobe diagnosed in November 2015.  PRIOR THERAPY:  1. Status post 3 cycles of neoadjuvant chemotherapy with carboplatin and docetaxel, last dose was given November 22, 2004. 2. Status post left upper lobectomy with lymph node dissection under the care of Dr. Arlyce Dice on January 11, 2005. 3. Status post pericardial window on February 02, 2005 for evacuation of postoperative pericardial tamponade and the fluid was negative for malignancy. 4. Status post 3 cycles of adjuvant chemotherapy with carboplatin and gemcitabine. Last dose was given May 13, 2005. 5. Status post 5 cycles of systemic chemotherapy with carboplatin, paclitaxel and Avastin for disease recurrence. Last dose was given January 04, 2006 and the patient had stable disease by the end of the last cycle. 6. Status post maintenance treatment with Avastin 15 mg/kg given every 3 weeks. The patient is status post 42 cycles, discontinued on July 02, 2008 after the patient had stable disease for more than 2 years. 7. Status post Bilateral selective ureteral cytology, Bilateral retrograde pyelography, Transurethral resection of bladder tumor, Random bladder biopsies, Prostatic urethral  biopsy and Transrectal biopsy of the prostate under the care of Dr. Rutherford Limerick at [facility]. 8. Status post redo thoracotomy with left pneumonectomy under the care of Dr. Lianne Moris at [facility] on 03/05/2014.  CURRENT THERAPY: Observation.  INTERVAL HISTORY: KERRI ASCHE 74 y.o. male returns to the clinic today for follow-up visit.  The patient is feeling fine today with no concerning complaints.  He underwent CT-guided core biopsy of the right lung nodule by interventional radiology in August 2021 but the final pathology was negative for malignancy.  The patient was also seen by Dr. Lisbeth Renshaw from radiation oncology and was expected to have SBRT for this lesion if it continues to increase in size.  He denied having any current chest pain but has shortness of breath with exertion with no cough or hemoptysis.  He denied having any fever or chills.  He has no nausea, vomiting, diarrhea or constipation.  He recently underwent biopsy of renal mass at [facility] and it looks like the final pathology was consistent with high-grade urothelial carcinoma.  He has not seen his urologist yet for evaluation and discussion of his treatment options.  The patient had repeat CT scan of the chest performed recently and is here for evaluation and discussion of his discuss results.  MEDICAL HISTORY: Past Medical History:  Diagnosis Date  . Bladder cancer ([place]) 01/02/13  . GERD (gastroesophageal reflux disease)   . Hyperlipemia   . Hypertension   . lung ca dx'd 07/2004   chemo comp 06/2008  . Lung cancer ([place])   . Neuropathy     ALLERGIES:  is allergic to dilaudid [hydromorphone hcl].  MEDICATIONS:  Current Outpatient Medications  Medication Sig Dispense Refill  . Albuterol  Sulfate 108 (90 Base) MCG/ACT AEPB Inhale 2 puffs into the lungs every 6 (six) hours as needed.  (Patient not taking: No sig reported)    . amLODipine (NORVASC) 10 MG tablet Take 10 mg by mouth daily.    Marland Kitchen  aspirin EC 81 MG tablet Take 81 mg by mouth.    Marland Kitchen atorvastatin (LIPITOR) 40 MG tablet TAKE 1 TABLET BY MOUTH ONCE DAILY FOR CHOLESTEROL    . BREO ELLIPTA 100-25 MCG/INH AEPB 1 puff daily.    . Cholecalciferol (VITAMIN D3) 1000 units CAPS Take 1,000 mg by mouth daily.    . ciprofloxacin (CIPRO) 500 MG tablet Take 500 mg by mouth 2 (two) times daily.    . cyanocobalamin 1000 MCG tablet Take 1,000 mcg by mouth daily.    Marland Kitchen gabapentin (NEURONTIN) 300 MG capsule Take 300 mg by mouth 3 (three) times daily.    Marland Kitchen levothyroxine (SYNTHROID) 50 MCG tablet Take 1 tablet by mouth daily.    Marland Kitchen lisinopril (ZESTRIL) 10 MG tablet Take 20 mg by mouth daily.    . Multiple Vitamins-Minerals (MULTIVITAMIN WITH MINERALS) tablet Take 1 tablet by mouth daily.    Marland Kitchen omeprazole (PRILOSEC) 20 MG capsule Take 20 mg by mouth daily.    . polyethylene glycol (MIRALAX / GLYCOLAX) packet Take 17 g by mouth daily.    Marland Kitchen umeclidinium bromide (INCRUSE ELLIPTA) 62.5 MCG/INH AEPB Inhale 1 puff into the lungs daily.     No current facility-administered medications for this visit.    REVIEW OF SYSTEMS:  Constitutional: positive for fatigue Eyes: negative Ears, nose, mouth, throat, and face: negative Respiratory: positive for dyspnea on exertion Cardiovascular: negative Gastrointestinal: negative Genitourinary:negative Integument/breast: negative Hematologic/lymphatic: negative Musculoskeletal:positive for back pain Neurological: negative Behavioral/Psych: negative Endocrine: negative Allergic/Immunologic: negative   PHYSICAL EXAMINATION: General appearance: alert, cooperative, fatigued and no distress Head: Normocephalic, without obvious abnormality, atraumatic Neck: no adenopathy Lymph nodes: Cervical, supraclavicular, and axillary nodes normal. Resp: Clear to auscultation on the right and absent breath sound on the left Back: symmetric, no curvature. ROM normal. No CVA tenderness. Cardio: regular rate and rhythm, S1, S2  normal, no murmur, click, rub or gallop GI: soft, non-tender; bowel sounds normal; no masses,  no organomegaly Extremities: extremities normal, atraumatic, no cyanosis or edema Neurologic: Alert and oriented X 3, normal strength and tone. Normal symmetric reflexes. Normal coordination and gait  ECOG PERFORMANCE STATUS: 1 - Symptomatic but completely ambulatory  Blood pressure 138/67, pulse 72, temperature (!) 97 F (36.1 C), temperature source Tympanic, resp. rate 18, height 5\' 10"  (1.778 m), weight 241 lb 8 oz (109.5 kg), SpO2 98 %.  LABORATORY DATA: Lab Results  Component Value Date   WBC 7.8 01/03/2020   HGB 11.1 (L) 01/03/2020   HCT 33.7 (L) 01/03/2020   MCV 95.2 01/03/2020   PLT 183 01/03/2020      Chemistry      Component Value Date/Time   NA 138 01/03/2020 1054   NA 138 08/24/2016 1501   K 4.2 01/03/2020 1054   K 4.0 08/24/2016 1501   CL 107 01/03/2020 1054   CL 102 09/30/2011 0843   CO2 24 01/03/2020 1054   CO2 26 08/24/2016 1501   BUN 23 01/03/2020 1054   BUN 23.0 08/24/2016 1501   CREATININE 1.39 (H) 01/03/2020 1054   CREATININE 1.1 08/24/2016 1501      Component Value Date/Time   CALCIUM 9.2 01/03/2020 1054   CALCIUM 9.3 08/24/2016 1501   ALKPHOS 86 01/03/2020 1054  ALKPHOS 111 08/24/2016 1501   AST 18 01/03/2020 1054   AST 19 08/24/2016 1501   ALT 12 01/03/2020 1054   ALT 16 08/24/2016 1501   BILITOT 0.3 01/03/2020 1054   BILITOT 0.72 08/24/2016 1501       RADIOGRAPHIC STUDIES: CT Abdomen Pelvis Wo Contrast  Result Date: 01/05/2020 CLINICAL DATA:  Non-small cell left lung cancer status post left pneumonectomy. Follow-up FDG avid right lower lobe pulmonary nodule (biopsy of this nodule on 09/16/2019 demonstrated no evidence of malignancy). Additional history of bladder cancer status post cystoprostatectomy. EXAM: CT CHEST, ABDOMEN AND PELVIS WITHOUT CONTRAST TECHNIQUE: Multidetector CT imaging of the chest, abdomen and pelvis was performed following  the standard protocol without IV contrast. COMPARISON:  08/19/2019 CT chest, abdomen and pelvis. 09/03/2019 PET-CT. Outside CT abdomen/pelvis dated 11/29/2019. FINDINGS: CT CHEST FINDINGS Cardiovascular: Normal heart size. No significant pericardial effusion/thickening. Left anterior descending and right coronary atherosclerosis. Left subclavian Port-A-Cath terminates at the cavoatrial junction. Atherosclerotic nonaneurysmal thoracic aorta. Top-normal caliber main pulmonary artery (3.2 cm diameter). Mediastinum/Nodes: Subcentimeter hypodense right thyroid isthmus nodule is stable. Not clinically significant; no follow-up imaging recommended (ref: J Am Coll Radiol. 2015 Feb;12(2): 143-50). Unremarkable esophagus. No pathologically enlarged axillary lymph nodes. Stable calcified nonenlarged left axillary node. Lungs/Pleura: Status post left pneumonectomy. Chronic smooth left pleural thickening with trace left pleural effusion, unchanged. No right pneumothorax. No right pleural effusion. Severe centrilobular emphysema with diffuse bronchial wall thickening in the right lung. Irregular solid 2.0 x 1.7 cm superior segment right lower lobe pulmonary nodule (series 4/image 81), previously 1.5 x 1.1 cm on 08/19/2019 chest CT, increased. Similar mild focal patchy tree-in-bud opacity in the right middle lobe (series 4/image 114). Stable perifissural nodularity along the right major fissure. No acute consolidative airspace disease or new significant pulmonary nodules. Musculoskeletal: No aggressive appearing focal osseous lesions. Stable post thoracotomy changes in the posterolateral left mid ribs. Mild thoracic spondylosis. CT ABDOMEN PELVIS FINDINGS Hepatobiliary: Normal liver size. Simple 2.3 cm left liver cyst. A few additional scattered subcentimeter hypodense liver lesions are too small to characterize and are unchanged. No new liver lesions. Cholecystectomy. Bile ducts are stable and within normal post cholecystectomy  limits. CBD diameter 7 mm. Pancreas: Normal, with no mass or duct dilation. Spleen: Normal size. No mass. Adrenals/Urinary Tract: Normal adrenals. Percutaneous nephrostomy tube terminates in right renal pelvis. No residual hydronephrosis. No renal stones. Exophytic minimally complex 6.8 cm anterior upper right renal cyst with thin mural calcification, stable, considered Bosniak category 2. Several simple scattered left renal cysts, largest 2.4 cm anteriorly in the upper left kidney. Stable calcified 1.1 cm posterior lower right renal cortical lesion. No new contour deforming renal lesions. Postsurgical changes from cystoprostatectomy with ileal conduit urinary diversion in the ventral right abdominal wall. Right lower lumbar ureteral 1.9 x 1.9 cm soft tissue density mass (series 2/image 100) is increased from 1.5 x 1.3 cm on 08/19/2019 CT with stable mild upstream right ureterectasis. Normal caliber left ureter. Ileal conduit is relatively collapsed and grossly normal. Stomach/Bowel: Normal non-distended stomach. 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 No discrete small bowel caliber transition. Oral contrast transits to the cecum. Normal appendix. Normal large bowel with no diverticulosis, large bowel wall thickening or pericolonic fat stranding. Vascular/Lymphatic: Atherosclerotic abdominal aorta with ectatic 2.9 cm infrarenal abdominal aorta, stable. No pathologically enlarged lymph nodes in the abdomen or pelvis. Reproductive: Prostatectomy. Other: No pneumoperitoneum, ascites or focal  fluid collection. Musculoskeletal: No aggressive appearing focal osseous lesions. Moderate lumbar spondylosis. IMPRESSION: 1. Irregular solid 2.0 cm superior segment right lower lobe pulmonary nodule, increased in size since 08/19/2019 chest CT, highly suspicious for malignant nodule, differential includes metachronous primary  bronchogenic carcinoma versus metastasis. 2. Right lower lumbar ureteral 1.9 cm soft tissue density mass, increased from 1.5 cm on 08/19/2019 CT, with stable mild upstream right ureterectasis, highly suspicious for right ureteral malignancy. Percutaneous nephrostomy tube terminates in the right renal pelvis. No residual hydronephrosis. 3. Otherwise no sites of metastatic disease in the chest, abdomen or pelvis on this noncontrast scan. 4. No evidence of local tumor recurrence status post left pneumonectomy. 5. 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 6. Aortic Atherosclerosis (ICD10-I70.0) and Emphysema (ICD10-J43.9). Electronically Signed   By: Ilona Sorrel M.D.   On: 01/05/2020 17:55   CT Chest Wo Contrast  Result Date: 01/05/2020 CLINICAL DATA:  Non-small cell left lung cancer status post left pneumonectomy. Follow-up FDG avid right lower lobe pulmonary nodule (biopsy of this nodule on 09/16/2019 demonstrated no evidence of malignancy). Additional history of bladder cancer status post cystoprostatectomy. EXAM: CT CHEST, ABDOMEN AND PELVIS WITHOUT CONTRAST TECHNIQUE: Multidetector CT imaging of the chest, abdomen and pelvis was performed following the standard protocol without IV contrast. COMPARISON:  08/19/2019 CT chest, abdomen and pelvis. 09/03/2019 PET-CT. Outside CT abdomen/pelvis dated 11/29/2019. FINDINGS: CT CHEST FINDINGS Cardiovascular: Normal heart size. No significant pericardial effusion/thickening. Left anterior descending and right coronary atherosclerosis. Left subclavian Port-A-Cath terminates at the cavoatrial junction. Atherosclerotic nonaneurysmal thoracic aorta. Top-normal caliber main pulmonary artery (3.2 cm diameter). Mediastinum/Nodes: Subcentimeter hypodense right thyroid isthmus nodule is stable. Not clinically significant; no follow-up imaging recommended (ref: J Am Coll Radiol. 2015  Feb;12(2): 143-50). Unremarkable esophagus. No pathologically enlarged axillary lymph nodes. Stable calcified nonenlarged left axillary node. Lungs/Pleura: Status post left pneumonectomy. Chronic smooth left pleural thickening with trace left pleural effusion, unchanged. No right pneumothorax. No right pleural effusion. Severe centrilobular emphysema with diffuse bronchial wall thickening in the right lung. Irregular solid 2.0 x 1.7 cm superior segment right lower lobe pulmonary nodule (series 4/image 81), previously 1.5 x 1.1 cm on 08/19/2019 chest CT, increased. Similar mild focal patchy tree-in-bud opacity in the right middle lobe (series 4/image 114). Stable perifissural nodularity along the right major fissure. No acute consolidative airspace disease or new significant pulmonary nodules. Musculoskeletal: No aggressive appearing focal osseous lesions. Stable post thoracotomy changes in the posterolateral left mid ribs. Mild thoracic spondylosis. CT ABDOMEN PELVIS FINDINGS Hepatobiliary: Normal liver size. Simple 2.3 cm left liver cyst. A few additional scattered subcentimeter hypodense liver lesions are too small to characterize and are unchanged. No new liver lesions. Cholecystectomy. Bile ducts are stable and within normal post cholecystectomy limits. CBD diameter 7 mm. Pancreas: Normal, with no mass or duct dilation. Spleen: Normal size. No mass. Adrenals/Urinary Tract: Normal adrenals. Percutaneous nephrostomy tube terminates in right renal pelvis. No residual hydronephrosis. No renal stones. Exophytic minimally complex 6.8 cm anterior upper right renal cyst with thin mural calcification, stable, considered Bosniak category 2. Several simple scattered left renal cysts, largest 2.4 cm anteriorly in the upper left kidney. Stable calcified 1.1 cm posterior lower right renal cortical lesion. No new contour deforming renal lesions. Postsurgical changes from cystoprostatectomy with ileal conduit urinary diversion  in the ventral right abdominal wall. Right lower lumbar ureteral 1.9 x 1.9 cm soft tissue density mass (series 2/image 100)  is increased from 1.5 x 1.3 cm on 08/19/2019 CT with stable mild upstream right ureterectasis. Normal caliber left ureter. Ileal conduit is relatively collapsed and grossly normal. Stomach/Bowel: Normal non-distended stomach. 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 No discrete small bowel caliber transition. Oral contrast transits to the cecum. Normal appendix. Normal large bowel with no diverticulosis, large bowel wall thickening or pericolonic fat stranding. Vascular/Lymphatic: Atherosclerotic abdominal aorta with ectatic 2.9 cm infrarenal abdominal aorta, stable. No pathologically enlarged lymph nodes in the abdomen or pelvis. Reproductive: Prostatectomy. Other: No pneumoperitoneum, ascites or focal fluid collection. Musculoskeletal: No aggressive appearing focal osseous lesions. Moderate lumbar spondylosis. IMPRESSION: 1. Irregular solid 2.0 cm superior segment right lower lobe pulmonary nodule, increased in size since 08/19/2019 chest CT, highly suspicious for malignant nodule, differential includes metachronous primary bronchogenic carcinoma versus metastasis. 2. Right lower lumbar ureteral 1.9 cm soft tissue density mass, increased from 1.5 cm on 08/19/2019 CT, with stable mild upstream right ureterectasis, highly suspicious for right ureteral malignancy. Percutaneous nephrostomy tube terminates in the right renal pelvis. No residual hydronephrosis. 3. Otherwise no sites of metastatic disease in the chest, abdomen or pelvis on this noncontrast scan. 4. No evidence of local tumor recurrence status post left pneumonectomy. 5. 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 6. Aortic Atherosclerosis  (ICD10-I70.0) and Emphysema (ICD10-J43.9). Electronically Signed   By: Ilona Sorrel M.D.   On: 01/05/2020 17:55   ASSESSMENT AND PLAN: This is a very pleasant 74 years old white male with: 1)  recurrent non-small cell lung cancer status post several treatment regimen including neoadjuvant chemotherapy followed by left upper lobectomy followed by systemic chemotherapy as well as maintenance treatment with Avastin and has been observation since June of 2010 and with no evidence for disease recurrence until November 2015 when he was found to have hypermetabolic activity in the left lower lobe.. The patient underwent redo thoracotomy with complete left pneumonectomy His most recent imaging studies including CT scan of the chest as well as a PET scan in August 2021 showed a suspicious hypermetabolic nodule in the right lung.  The patient underwent CT-guided biopsy of this lesion but it was negative for malignancy.  He had repeat CT scan of the chest performed recently.  I personally and independently reviewed the scan images and discussed the results with the patient today. Unfortunately scan showed further increase in the size of the irregular solid 2.0 cm superior right lower lobe pulmonary nodule suspicious for metachronous primary bronchogenic carcinoma versus metastasis. This is highly suspicious for malignancy even with the negative previous biopsy. I recommended for him to see Dr. Lisbeth Renshaw for consideration of SBRT to this lesion. I will see him back for follow-up visit in 5 months for evaluation with repeat CT scan of the chest, abdomen pelvis for restaging of his disease.  2) recent diagnosis of bladder cancer: Status post resection at [facility].he is currently followed by Dr.Tsivinn at [facility].  The recent biopsy confirmed a high-grade urothelial carcinoma.  The patient is scheduled to see his urologist in few weeks for further evaluation and recommendation regarding  management of this condition. If systemic treatment is needed, I will be happy to see the patient for discussion of this option either by me or Dr. Alen Blew. He will continue to have Port-A-Cath flush every 8 weeks. The patient  was advised to call immediately if he has any concerning symptoms in the interval.   The patient voices understanding of current disease status and treatment options and is in agreement with the current care plan.  All questions were answered. The patient knows to call the clinic with any problems, questions or concerns. We can certainly see the patient much sooner if necessary.  Disclaimer: This note was dictated with voice recognition software. Similar sounding words can inadvertently be transcribed and may be missed upon review.

## 2020-01-07 ENCOUNTER — Ambulatory Visit
Admission: RE | Admit: 2020-01-07 | Discharge: 2020-01-07 | Disposition: A | Discharge status: Home / Self Care | Payer: Medicare Other | Source: Ambulatory Visit | Attending: Radiation Oncology | Admitting: Radiation Oncology

## 2020-01-07 ENCOUNTER — Ambulatory Visit: Payer: Self-pay | Admitting: Radiation Oncology

## 2020-01-07 DIAGNOSIS — C3432 Malignant neoplasm of lower lobe, left bronchus or lung: Secondary | ICD-10-CM

## 2020-01-07 NOTE — Addendum Note (Signed)
Encounter addended by: Hayden Pedro, PA-C on: 01/07/2020 9:50 AM  Actions taken: Level of Service modified

## 2020-01-07 NOTE — Progress Notes (Signed)
Radiation Oncology         [phone] ________________________________  Outpatient Follow Up - Conducted via telephone due to current COVID-19 concerns for limiting patient exposure  I spoke with the patient to conduct this consult visit via telephone to spare the patient unnecessary potential exposure in the healthcare setting during the current COVID-19 pandemic. The patient was notified in advance and was offered a [place] meeting to allow for face to face communication but unfortunately reported that they did not have the appropriate resources/technology to visit.    Name: Wesley Harmon        MRN: [id number]  Date of Service: 01/07/2020 DOB: 1946-01-06  BP:[id number], Reeves Forth, MD  Curt Bears, MD     REFERRING PHYSICIAN: Curt Bears, MD   DIAGNOSIS: The encounter diagnosis was Primary cancer of left lower lobe of lung ([place]).   HISTORY OF PRESENT ILLNESS: Wesley Harmon is a 74 y.o. male seen at the request of Dr. Julien Nordmann for a possible stage I lung cancer within the right lower lobe.  The patient has a history of a stage IIIb non-small cell lung cancer that was treated with neoadjuvant chemotherapy and subsequent resection of the left upper lobe in December 2006.  He also has a history of a bladder cancer diagnosed in 2013 status post resected resection followed by 6 months of BCG, but underwent cystectomy with ureteroileal conduit in August 2016.  The patient had also undergone a redo thoracotomy with left pneumonectomy in February 2016.  Recent CT scans have identified a new area in the right lower lobe posteriorly.  PET scan on 09/03/2019 showed hypermetabolism with an SUV of 8.42 in the 15 mm superior segment of the right lower lobe.  There was a 6.5 mm precarinal lymph node that showed hypermetabolism with an SUV of 4.4 without definitive right hilar adenopathy.  A subcarinal lymph node was mildly hypermetabolic with SUV of 8.24.  Given these findings he underwent  biopsy of the right lower lobe nodule under CT guidance.  Final pathology on 09/16/2019 revealed lung parenchyma with inflammation and reactive change without evidence of disease.  He was counseled on the rationale to proceed with repeat imaging, and a repeat CT staging scan of the chest abdomen pelvis on 01/03/2020 showed an increase in the size of the area in the right lower lobe, now it measured 2 cm in greatest dimension, no mediastinal adenopathy was identified, no axillary adenopathy was identified.  There was concern however for increased soft tissue density measuring 1.9 cm along the right lower lumbar ureteral space.  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  He is waiting to hear back from his urologist about next steps.  He is contacted by phone however today to discuss moving forward with radiation to the putative stage I lung cancer in the right lower lobe.  PREVIOUS RADIATION THERAPY: No   PAST MEDICAL HISTORY:  Past Medical History:  Diagnosis Date  . Bladder cancer ([place]) 01/02/13  . GERD (gastroesophageal reflux disease)   . Hyperlipemia   . Hypertension   . lung ca dx'd 07/2004   chemo comp 06/2008  . Lung cancer ([place])   . Neuropathy        PAST SURGICAL HISTORY: Past Surgical History:  Procedure Laterality Date  . arm surgery    .  CARDIAC SURGERY    . HERNIA REPAIR       FAMILY HISTORY: No family history on file.   SOCIAL HISTORY:  reports that he quit smoking about 15 years ago. He has a 162.00 pack-year smoking history. He has never used smokeless tobacco.  The patient is married and lives in [place].  ALLERGIES: Dilaudid [hydromorphone hcl]   MEDICATIONS:  Current Outpatient Medications  Medication Sig Dispense Refill  . amLODipine (NORVASC) 10 MG tablet Take 10 mg by  mouth daily.    Marland Kitchen aspirin EC 81 MG tablet Take 81 mg by mouth.    Marland Kitchen atorvastatin (LIPITOR) 40 MG tablet TAKE 1 TABLET BY MOUTH ONCE DAILY FOR CHOLESTEROL    . BREO ELLIPTA 100-25 MCG/INH AEPB 1 puff daily.    . Cholecalciferol (VITAMIN D3) 1000 units CAPS Take 1,000 mg by mouth daily.    . ciprofloxacin (CIPRO) 500 MG tablet Take 500 mg by mouth 2 (two) times daily.    . cyanocobalamin 1000 MCG tablet Take 1,000 mcg by mouth daily.    Marland Kitchen gabapentin (NEURONTIN) 300 MG capsule Take 300 mg by mouth 3 (three) times daily.    Marland Kitchen levothyroxine (SYNTHROID) 50 MCG tablet Take 1 tablet by mouth daily.    Marland Kitchen lisinopril (ZESTRIL) 10 MG tablet Take 20 mg by mouth daily.    . Multiple Vitamins-Minerals (MULTIVITAMIN WITH MINERALS) tablet Take 1 tablet by mouth daily.    Marland Kitchen omeprazole (PRILOSEC) 20 MG capsule Take 20 mg by mouth daily.    . polyethylene glycol (MIRALAX / GLYCOLAX) packet Take 17 g by mouth daily.    Marland Kitchen umeclidinium bromide (INCRUSE ELLIPTA) 62.5 MCG/INH AEPB Inhale 1 puff into the lungs daily.    Marland Kitchen acetaminophen (TYLENOL) 325 MG tablet Take 2 tablets by mouth every 6 (six) hours as needed.    . Albuterol Sulfate 108 (90 Base) MCG/ACT AEPB Inhale 2 puffs into the lungs every 6 (six) hours as needed.  (Patient not taking: No sig reported)    . benzonatate (TESSALON) 100 MG capsule Take 200 mg by mouth every 8 (eight) hours as needed.    . bisacodyl (DULCOLAX) 10 MG suppository Place 10 mg rectally daily as needed.    . lactulose (CHRONULAC) 10 GM/15ML solution Take 10 g by mouth 2 (two) times daily.    . primidone (MYSOLINE) 50 MG tablet Take 3 tablets by mouth at bedtime.     No current facility-administered medications for this encounter.     REVIEW OF SYSTEMS: On review of systems, the patient reports that he he continues to do pretty well.  He continues to use oxygen when he is active and exerting himself.  Otherwise he denies any shortness of breath or chest pain.  He is not experiencing  any new onset of pain.  He is still caring for his percutaneous nephrostomy on the right.  He denies any concerns with this.  No other complaints are verbalized.    PHYSICAL EXAM:  Unable to assess given encounter type.  ECOG = 1  0 - Asymptomatic (Fully active, able to carry on all predisease activities without restriction)  1 - Symptomatic but completely ambulatory (Restricted in physically strenuous activity but ambulatory and able to carry out work of a light or sedentary nature. For example, light housework, office work)  2 - Symptomatic, <50% in bed during the day (Ambulatory and capable of all self care but unable to carry out any work activities. Up and about more than 50%  of waking hours)  3 - Symptomatic, >50% in bed, but not bedbound (Capable of only limited self-care, confined to bed or chair 50% or more of waking hours)  4 - Bedbound (Completely disabled. Cannot carry on any self-care. Totally confined to bed or chair)  5 - Death   Eustace Pen MM, Creech RH, Tormey DC, et al. [phone]). "Toxicity and response criteria of the [facility]". [place] Oncol. 5 (6): 649-55    LABORATORY DATA:  Lab Results  Component Value Date   WBC 7.8 01/03/2020   HGB 11.1 (L) 01/03/2020   HCT 33.7 (L) 01/03/2020   MCV 95.2 01/03/2020   PLT 183 01/03/2020   Lab Results  Component Value Date   NA 138 01/03/2020   K 4.2 01/03/2020   CL 107 01/03/2020   CO2 24 01/03/2020   Lab Results  Component Value Date   ALT 12 01/03/2020   AST 18 01/03/2020   ALKPHOS 86 01/03/2020   BILITOT 0.3 01/03/2020      RADIOGRAPHY: CT Abdomen Pelvis Wo Contrast  Result Date: 01/05/2020 CLINICAL DATA:  Non-small cell left lung cancer status post left pneumonectomy. Follow-up FDG avid right lower lobe pulmonary nodule (biopsy of this nodule on 09/16/2019 demonstrated no evidence of malignancy). Additional history of bladder cancer status post cystoprostatectomy. EXAM: CT CHEST,  ABDOMEN AND PELVIS WITHOUT CONTRAST TECHNIQUE: Multidetector CT imaging of the chest, abdomen and pelvis was performed following the standard protocol without IV contrast. COMPARISON:  08/19/2019 CT chest, abdomen and pelvis. 09/03/2019 PET-CT. Outside CT abdomen/pelvis dated 11/29/2019. FINDINGS: CT CHEST FINDINGS Cardiovascular: Normal heart size. No significant pericardial effusion/thickening. Left anterior descending and right coronary atherosclerosis. Left subclavian Port-A-Cath terminates at the cavoatrial junction. Atherosclerotic nonaneurysmal thoracic aorta. Top-normal caliber main pulmonary artery (3.2 cm diameter). Mediastinum/Nodes: Subcentimeter hypodense right thyroid isthmus nodule is stable. Not clinically significant; no follow-up imaging recommended (ref: J Am Coll Radiol. 2015 Feb;12(2): 143-50). Unremarkable esophagus. No pathologically enlarged axillary lymph nodes. Stable calcified nonenlarged left axillary node. Lungs/Pleura: Status post left pneumonectomy. Chronic smooth left pleural thickening with trace left pleural effusion, unchanged. No right pneumothorax. No right pleural effusion. Severe centrilobular emphysema with diffuse bronchial wall thickening in the right lung. Irregular solid 2.0 x 1.7 cm superior segment right lower lobe pulmonary nodule (series 4/image 81), previously 1.5 x 1.1 cm on 08/19/2019 chest CT, increased. Similar mild focal patchy tree-in-bud opacity in the right middle lobe (series 4/image 114). Stable perifissural nodularity along the right major fissure. No acute consolidative airspace disease or new significant pulmonary nodules. Musculoskeletal: No aggressive appearing focal osseous lesions. Stable post thoracotomy changes in the posterolateral left mid ribs. Mild thoracic spondylosis. CT ABDOMEN PELVIS FINDINGS Hepatobiliary: Normal liver size. Simple 2.3 cm left liver cyst. A few additional scattered subcentimeter hypodense liver lesions are too small to  characterize and are unchanged. No new liver lesions. Cholecystectomy. Bile ducts are stable and within normal post cholecystectomy limits. CBD diameter 7 mm. Pancreas: Normal, with no mass or duct dilation. Spleen: Normal size. No mass. Adrenals/Urinary Tract: Normal adrenals. Percutaneous nephrostomy tube terminates in right renal pelvis. No residual hydronephrosis. No renal stones. Exophytic minimally complex 6.8 cm anterior upper right renal cyst with thin mural calcification, stable, considered Bosniak category 2. Several simple scattered left renal cysts, largest 2.4 cm anteriorly in the upper left kidney. Stable calcified 1.1 cm posterior lower right renal cortical lesion. No new contour deforming renal lesions. Postsurgical changes from cystoprostatectomy with ileal conduit urinary diversion  in the ventral right abdominal wall. Right lower lumbar ureteral 1.9 x 1.9 cm soft tissue density mass (series 2/image 100) is increased from 1.5 x 1.3 cm on 08/19/2019 CT with stable mild upstream right ureterectasis. Normal caliber left ureter. Ileal conduit is relatively collapsed and grossly normal. Stomach/Bowel: Normal non-distended stomach. 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 No discrete small bowel caliber transition. Oral contrast transits to the cecum. Normal appendix. Normal large bowel with no diverticulosis, large bowel wall thickening or pericolonic fat stranding. Vascular/Lymphatic: Atherosclerotic abdominal aorta with ectatic 2.9 cm infrarenal abdominal aorta, stable. No pathologically enlarged lymph nodes in the abdomen or pelvis. Reproductive: Prostatectomy. Other: No pneumoperitoneum, ascites or focal fluid collection. Musculoskeletal: No aggressive appearing focal osseous lesions. Moderate lumbar spondylosis. IMPRESSION: 1. Irregular solid 2.0 cm superior segment right lower lobe pulmonary nodule,  increased in size since 08/19/2019 chest CT, highly suspicious for malignant nodule, differential includes metachronous primary bronchogenic carcinoma versus metastasis. 2. Right lower lumbar ureteral 1.9 cm soft tissue density mass, increased from 1.5 cm on 08/19/2019 CT, with stable mild upstream right ureterectasis, highly suspicious for right ureteral malignancy. Percutaneous nephrostomy tube terminates in the right renal pelvis. No residual hydronephrosis. 3. Otherwise no sites of metastatic disease in the chest, abdomen or pelvis on this noncontrast scan. 4. No evidence of local tumor recurrence status post left pneumonectomy. 5. 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 6. Aortic Atherosclerosis (ICD10-I70.0) and Emphysema (ICD10-J43.9). Electronically Signed   By: Ilona Sorrel M.D.   On: 01/05/2020 17:55   CT Chest Wo Contrast  Result Date: 01/05/2020 CLINICAL DATA:  Non-small cell left lung cancer status post left pneumonectomy. Follow-up FDG avid right lower lobe pulmonary nodule (biopsy of this nodule on 09/16/2019 demonstrated no evidence of malignancy). Additional history of bladder cancer status post cystoprostatectomy. EXAM: CT CHEST, ABDOMEN AND PELVIS WITHOUT CONTRAST TECHNIQUE: Multidetector CT imaging of the chest, abdomen and pelvis was performed following the standard protocol without IV contrast. COMPARISON:  08/19/2019 CT chest, abdomen and pelvis. 09/03/2019 PET-CT. Outside CT abdomen/pelvis dated 11/29/2019. FINDINGS: CT CHEST FINDINGS Cardiovascular: Normal heart size. No significant pericardial effusion/thickening. Left anterior descending and right coronary atherosclerosis. Left subclavian Port-A-Cath terminates at the cavoatrial junction. Atherosclerotic nonaneurysmal thoracic aorta. Top-normal caliber main pulmonary artery (3.2 cm diameter). Mediastinum/Nodes: Subcentimeter hypodense  right thyroid isthmus nodule is stable. Not clinically significant; no follow-up imaging recommended (ref: J Am Coll Radiol. 2015 Feb;12(2): 143-50). Unremarkable esophagus. No pathologically enlarged axillary lymph nodes. Stable calcified nonenlarged left axillary node. Lungs/Pleura: Status post left pneumonectomy. Chronic smooth left pleural thickening with trace left pleural effusion, unchanged. No right pneumothorax. No right pleural effusion. Severe centrilobular emphysema with diffuse bronchial wall thickening in the right lung. Irregular solid 2.0 x 1.7 cm superior segment right lower lobe pulmonary nodule (series 4/image 81), previously 1.5 x 1.1 cm on 08/19/2019 chest CT, increased. Similar mild focal patchy tree-in-bud opacity in the right middle lobe (series 4/image 114). Stable perifissural nodularity along the right major fissure. No acute consolidative airspace disease or new significant pulmonary nodules. Musculoskeletal: No aggressive appearing focal osseous lesions. Stable post thoracotomy changes in the posterolateral left mid ribs. Mild thoracic spondylosis. CT ABDOMEN PELVIS FINDINGS Hepatobiliary: Normal liver size. Simple 2.3 cm left liver cyst. A few additional scattered subcentimeter hypodense liver lesions are too small to characterize and are unchanged.  No new liver lesions. Cholecystectomy. Bile ducts are stable and within normal post cholecystectomy limits. CBD diameter 7 mm. Pancreas: Normal, with no mass or duct dilation. Spleen: Normal size. No mass. Adrenals/Urinary Tract: Normal adrenals. Percutaneous nephrostomy tube terminates in right renal pelvis. No residual hydronephrosis. No renal stones. Exophytic minimally complex 6.8 cm anterior upper right renal cyst with thin mural calcification, stable, considered Bosniak category 2. Several simple scattered left renal cysts, largest 2.4 cm anteriorly in the upper left kidney. Stable calcified 1.1 cm posterior lower right renal cortical  lesion. No new contour deforming renal lesions. Postsurgical changes from cystoprostatectomy with ileal conduit urinary diversion in the ventral right abdominal wall. Right lower lumbar ureteral 1.9 x 1.9 cm soft tissue density mass (series 2/image 100) is increased from 1.5 x 1.3 cm on 08/19/2019 CT with stable mild upstream right ureterectasis. Normal caliber left ureter. Ileal conduit is relatively collapsed and grossly normal. Stomach/Bowel: Normal non-distended stomach. 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 No discrete small bowel caliber transition. Oral contrast transits to the cecum. Normal appendix. Normal large bowel with no diverticulosis, large bowel wall thickening or pericolonic fat stranding. Vascular/Lymphatic: Atherosclerotic abdominal aorta with ectatic 2.9 cm infrarenal abdominal aorta, stable. No pathologically enlarged lymph nodes in the abdomen or pelvis. Reproductive: Prostatectomy. Other: No pneumoperitoneum, ascites or focal fluid collection. Musculoskeletal: No aggressive appearing focal osseous lesions. Moderate lumbar spondylosis. IMPRESSION: 1. Irregular solid 2.0 cm superior segment right lower lobe pulmonary nodule, increased in size since 08/19/2019 chest CT, highly suspicious for malignant nodule, differential includes metachronous primary bronchogenic carcinoma versus metastasis. 2. Right lower lumbar ureteral 1.9 cm soft tissue density mass, increased from 1.5 cm on 08/19/2019 CT, with stable mild upstream right ureterectasis, highly suspicious for right ureteral malignancy. Percutaneous nephrostomy tube terminates in the right renal pelvis. No residual hydronephrosis. 3. Otherwise no sites of metastatic disease in the chest, abdomen or pelvis on this noncontrast scan. 4. No evidence of local tumor recurrence status post left pneumonectomy. 5. Stable large parastomal hernia in the  ventral right abdominal wall containing numerous small bowel loops without acute bowel complication. 6. Aortic Atherosclerosis (ICD10-I70.0) and Emphysema (ICD10-J43.9). Electronically Signed   By: Ilona Sorrel M.D.   On: 01/05/2020 17:55       IMPRESSION/PLAN: 1. Putative Stage IA2, cT1bN0M0, NSCLC of the RLL. Dr. Lisbeth Renshaw Dr. Lisbeth Renshaw discusses the patient's course since our last conversation.  He reviews the imaging findings and the rationale to consider radiation at this time.  He discusses the uniqueness of stereotactic body radiotherapy (SBRT) as it can be given in shorter course with curative intent.  We discussed the risks, benefits, short, and long term effects of radiotherapy, as well as the curative intent, and the patient is interested in proceeding.  Dr. Lisbeth Renshaw recommends a course of 3-5 fractions.  The patient will come in tomorrow for simulation.  He is actually signed written consent back in August and if our department is comfortable with this we can use this to proceed.  We did again however review the risks and benefits as above.  He is aware that this diagnosis is lower priority than his recurrent urothelial carcinoma, but is interested in having this treatment out of the way so that he can focus on neck steps for that as well. 2. History of stage III lung cancer.  The patient will continue to follow-up with Dr. Julien Nordmann in surveillance. 3.  History of urothelial carcinoma.  Unfortunately the patient does have what appears to be recurrent disease.  He will follow up with his urologist at [facility], he is also aware of the possibility of needing to establish with a GU specialty medical oncologist.  This will be followed expectantly.    Given current concerns for patient exposure during the COVID-19 pandemic, this encounter was conducted via telephone.  The patient has provided two factor identification and has given verbal consent for this type of encounter and has been advised to only accept  a meeting of this type in a secure network environment. The time spent during this encounter was 45 minutes including preparation, discussion, and coordination of the patient's care. The attendants for this meeting include  Dr. Lisbeth Renshaw, Hayden Pedro  and Ralph Leyden.  During the encounter, Dr. Lisbeth Renshaw, and Hayden Pedro were located at [facility] Radiation Oncology Department.  Cruzito Standre Wirtz was located at home.    The above documentation reflects my direct findings during this shared patient visit. Please see the separate note by Dr. Lisbeth Renshaw on this date for the remainder of the patient's plan of care.    Carola Rhine, PAC

## 2020-01-08 ENCOUNTER — Telehealth: Payer: Self-pay | Admitting: Internal Medicine

## 2020-01-08 ENCOUNTER — Ambulatory Visit
Admission: RE | Admit: 2020-01-08 | Discharge: 2020-01-08 | Disposition: A | Discharge status: Home / Self Care | Payer: Medicare Other | Source: Ambulatory Visit | Attending: Radiation Oncology | Admitting: Radiation Oncology

## 2020-01-08 DIAGNOSIS — C3432 Malignant neoplasm of lower lobe, left bronchus or lung: Secondary | ICD-10-CM | POA: Insufficient documentation

## 2020-01-08 NOTE — Addendum Note (Signed)
Encounter addended by: Kyung Rudd, MD on: 01/08/2020 8:59 AM  Actions taken: Edit attestation on clinical note

## 2020-01-08 NOTE — Telephone Encounter (Signed)
Scheduled appts per 12/20 los. Pt's spouse confirmed appt dates and times.

## 2020-01-15 DIAGNOSIS — C3432 Malignant neoplasm of lower lobe, left bronchus or lung: Secondary | ICD-10-CM | POA: Diagnosis not present

## 2020-01-16 ENCOUNTER — Telehealth: Payer: Self-pay

## 2020-01-16 NOTE — Telephone Encounter (Signed)
Pt called and requested you move forward with referring him to a Nephrologist.

## 2020-01-20 ENCOUNTER — Other Ambulatory Visit: Payer: Self-pay | Admitting: Internal Medicine

## 2020-01-20 DIAGNOSIS — C3432 Malignant neoplasm of lower lobe, left bronchus or lung: Secondary | ICD-10-CM

## 2020-01-20 NOTE — Telephone Encounter (Signed)
Referral done to [facility] [place] [facility]

## 2020-01-21 ENCOUNTER — Other Ambulatory Visit: Payer: Self-pay

## 2020-01-21 ENCOUNTER — Ambulatory Visit
Admission: RE | Admit: 2020-01-21 | Discharge: 2020-01-21 | Disposition: A | Discharge status: Home / Self Care | Payer: Medicare Other | Source: Ambulatory Visit | Attending: Radiation Oncology | Admitting: Radiation Oncology

## 2020-01-21 ENCOUNTER — Telehealth: Payer: Self-pay | Admitting: Medical Oncology

## 2020-01-21 DIAGNOSIS — C3432 Malignant neoplasm of lower lobe, left bronchus or lung: Secondary | ICD-10-CM | POA: Insufficient documentation

## 2020-01-21 NOTE — Telephone Encounter (Signed)
LV for provider staff to f/u referral from 12/20-pt has not heard about any appt.   Faxed records to Dr Posey Pronto.

## 2020-01-22 ENCOUNTER — Ambulatory Visit: Payer: Medicare Other | Admitting: Radiation Oncology

## 2020-01-23 ENCOUNTER — Ambulatory Visit: Payer: Medicare Other | Admitting: Radiation Oncology

## 2020-01-23 ENCOUNTER — Other Ambulatory Visit: Payer: Self-pay

## 2020-01-23 ENCOUNTER — Ambulatory Visit
Admission: RE | Admit: 2020-01-23 | Discharge: 2020-01-23 | Disposition: A | Discharge status: Home / Self Care | Payer: Medicare Other | Source: Ambulatory Visit | Attending: Radiation Oncology | Admitting: Radiation Oncology

## 2020-01-23 DIAGNOSIS — C3432 Malignant neoplasm of lower lobe, left bronchus or lung: Secondary | ICD-10-CM | POA: Diagnosis not present

## 2020-01-28 ENCOUNTER — Ambulatory Visit
Admission: RE | Admit: 2020-01-28 | Discharge: 2020-01-28 | Disposition: A | Discharge status: Home / Self Care | Payer: Medicare Other | Source: Ambulatory Visit | Attending: Radiation Oncology | Admitting: Radiation Oncology

## 2020-01-28 ENCOUNTER — Other Ambulatory Visit: Payer: Self-pay

## 2020-01-28 ENCOUNTER — Ambulatory Visit: Payer: Medicare Other | Admitting: Radiation Oncology

## 2020-01-28 ENCOUNTER — Encounter: Payer: Self-pay | Admitting: Radiation Oncology

## 2020-01-28 DIAGNOSIS — C3432 Malignant neoplasm of lower lobe, left bronchus or lung: Secondary | ICD-10-CM | POA: Diagnosis not present

## 2020-01-30 ENCOUNTER — Ambulatory Visit: Payer: Medicare Other | Admitting: Radiation Oncology

## 2020-02-05 ENCOUNTER — Inpatient Hospital Stay: Payer: Medicare Other | Attending: Internal Medicine

## 2020-02-05 ENCOUNTER — Inpatient Hospital Stay: Payer: Medicare Other

## 2020-02-05 ENCOUNTER — Other Ambulatory Visit: Payer: Self-pay

## 2020-02-05 DIAGNOSIS — C3432 Malignant neoplasm of lower lobe, left bronchus or lung: Secondary | ICD-10-CM

## 2020-02-05 DIAGNOSIS — Z95828 Presence of other vascular implants and grafts: Secondary | ICD-10-CM

## 2020-02-05 DIAGNOSIS — C3490 Malignant neoplasm of unspecified part of unspecified bronchus or lung: Secondary | ICD-10-CM

## 2020-02-05 LAB — CMP (CANCER CENTER ONLY)
ALT: 11 U/L (ref 0–44)
AST: 15 U/L (ref 15–41)
Albumin: 3.4 g/dL — ABNORMAL LOW (ref 3.5–5.0)
Alkaline Phosphatase: 83 U/L (ref 38–126)
Anion gap: 7 (ref 5–15)
BUN: 17 mg/dL (ref 8–23)
CO2: 28 mmol/L (ref 22–32)
Calcium: 8.9 mg/dL (ref 8.9–10.3)
Chloride: 105 mmol/L (ref 98–111)
Creatinine: 1.16 mg/dL (ref 0.61–1.24)
GFR, Estimated: >60 mL/min (ref >60)
Glucose, Bld: 104 mg/dL — ABNORMAL HIGH (ref 70–99)
Potassium: 4 mmol/L (ref 3.5–5.1)
Sodium: 140 mmol/L (ref 135–145)
Total Bilirubin: 0.4 mg/dL (ref 0.3–1.2)
Total Protein: 6.5 g/dL (ref 6.5–8.1)

## 2020-02-05 LAB — CBC WITH DIFFERENTIAL (CANCER CENTER ONLY)
Abs Immature Granulocytes: 0.01 10*3/uL (ref 0.00–0.07)
Basophils Absolute: 0 10*3/uL (ref 0.0–0.1)
Basophils Relative: 1 %
Eosinophils Absolute: 0.2 10*3/uL (ref 0.0–0.5)
Eosinophils Relative: 4 %
HCT: 37.2 % — ABNORMAL LOW (ref 39.0–52.0)
Hemoglobin: 12 g/dL — ABNORMAL LOW (ref 13.0–17.0)
Immature Granulocytes: 0 %
Lymphocytes Relative: 20 %
Lymphs Abs: 1.1 10*3/uL (ref 0.7–4.0)
MCH: 30.8 pg (ref 26.0–34.0)
MCHC: 32.3 g/dL (ref 30.0–36.0)
MCV: 95.6 fL (ref 80.0–100.0)
Monocytes Absolute: 0.5 10*3/uL (ref 0.1–1.0)
Monocytes Relative: 8 %
Neutro Abs: 3.9 10*3/uL (ref 1.7–7.7)
Neutrophils Relative %: 67 %
Platelet Count: 174 10*3/uL (ref 150–400)
RBC: 3.89 MIL/uL — ABNORMAL LOW (ref 4.22–5.81)
RDW: 13.7 % (ref 11.5–15.5)
WBC Count: 5.7 10*3/uL (ref 4.0–10.5)
nRBC: 0 % (ref 0.0–0.2)

## 2020-02-05 MED ORDER — SODIUM CHLORIDE 0.9% FLUSH
10.0000 mL | Freq: Once | INTRAVENOUS | Status: AC
  Administered 2020-02-05: 10 mL
  Filled 2020-02-05: qty 10

## 2020-02-05 MED ORDER — HEPARIN SOD (PORK) LOCK FLUSH 100 UNIT/ML IV SOLN
500.0000 [IU] | Freq: Once | INTRAVENOUS | Status: AC
  Administered 2020-02-05: 500 [IU]
  Filled 2020-02-05: qty 5

## 2020-02-12 NOTE — Progress Notes (Signed)
  Radiation Oncology         [phone]) [phone] ________________________________  Name: Wesley Harmon MRN: [id number]  Date: 01/28/2020  DOB: 1945/09/07  End of Treatment Note  Diagnosis:   stage I non-small cell lung cancer of the right lower lobe    Indication for treatment::  curative       Radiation treatment dates:   01/21/20 - 01/28/20  Site/dose:   The patient was treated to the right lung with a course of stereotactic body radiation treatment.  The patient received 54 Gray in 3 fractions using a IMRT/SBRT technique, with 3 fields.  Narrative: The patient tolerated radiation treatment relatively well.   No unexpected difficulties.  The patient's breathing did not significantly change during the course of the treatment.  Plan: The patient has completed radiation treatment. The patient will return to radiation oncology clinic for routine followup in one month. I advised the patient to call or return sooner if they have any questions or concerns related to their recovery or treatment. ________________________________  Jodelle Gross, M.D., Ph.D.

## 2020-02-20 ENCOUNTER — Ambulatory Visit
Admission: RE | Admit: 2020-02-20 | Discharge: 2020-02-20 | Disposition: A | Discharge status: Home / Self Care | Payer: Medicare Other | Source: Ambulatory Visit | Attending: Radiation Oncology | Admitting: Radiation Oncology

## 2020-02-20 ENCOUNTER — Other Ambulatory Visit: Payer: Self-pay

## 2020-02-20 DIAGNOSIS — C3432 Malignant neoplasm of lower lobe, left bronchus or lung: Secondary | ICD-10-CM | POA: Insufficient documentation

## 2020-02-20 NOTE — Progress Notes (Signed)
  Radiation Oncology         [phone]) [phone] ________________________________  Name: Wesley Harmon MRN: [id number]  Date of Service: 02/20/2020  DOB: Dec 01, 1945  Post Treatment Telephone Note  Diagnosis:   Putative Stage IA2, cT1bN0M0, NSCLC of the RLL  Interval Since Last Radiation:  3 weeks   01/21/20-01/28/20: The RLL target was treated to 54 Gy in 3 fractions  Narrative:  The patient was contacted today for routine follow-up. During treatment he did very well with radiotherapy and did not have significant desquamation. He reports he did well with radiotherapy but has been struggling with issues related to his urothelial cancer and has been quite tired from all of his treatment.   Impression/Plan: 1. Putative Stage IA2, cT1bN0M0, NSCLC of the RLL. The patient has been doing well since completion of radiotherapy. We discussed that we would be happy to continue to follow him as needed, but he will also continue to follow up with Dr. Julien Nordmann in medical oncology.  2.         History of stage III lung cancer.  The patient will continue to follow-up with Dr. Julien Nordmann in surveillance. 3.         History of urothelial carcinoma.  Unfortunately the patient does have what appears to be recurrent disease.  He will follow up with his urologist at [facility].  This will be followed expectantly.     Carola Rhine, PAC

## 2020-02-24 ENCOUNTER — Inpatient Hospital Stay: Admission: RE | Admit: 2020-02-24 | Payer: Medicare Other | Source: Ambulatory Visit

## 2020-03-30 ENCOUNTER — Other Ambulatory Visit: Payer: Self-pay | Admitting: Medical Oncology

## 2020-03-30 DIAGNOSIS — C3432 Malignant neoplasm of lower lobe, left bronchus or lung: Secondary | ICD-10-CM

## 2020-04-01 ENCOUNTER — Other Ambulatory Visit: Payer: Self-pay

## 2020-04-01 ENCOUNTER — Inpatient Hospital Stay: Payer: Medicare Other | Attending: Internal Medicine

## 2020-04-01 ENCOUNTER — Inpatient Hospital Stay: Payer: Medicare Other

## 2020-04-01 DIAGNOSIS — Z95828 Presence of other vascular implants and grafts: Secondary | ICD-10-CM

## 2020-04-01 DIAGNOSIS — C3432 Malignant neoplasm of lower lobe, left bronchus or lung: Secondary | ICD-10-CM | POA: Diagnosis present

## 2020-04-01 LAB — CMP (CANCER CENTER ONLY)
ALT: 9 U/L (ref 0–44)
AST: 12 U/L — ABNORMAL LOW (ref 15–41)
Albumin: 3.3 g/dL — ABNORMAL LOW (ref 3.5–5.0)
Alkaline Phosphatase: 100 U/L (ref 38–126)
Anion gap: 5 (ref 5–15)
BUN: 20 mg/dL (ref 8–23)
CO2: 26 mmol/L (ref 22–32)
Calcium: 8.6 mg/dL — ABNORMAL LOW (ref 8.9–10.3)
Chloride: 105 mmol/L (ref 98–111)
Creatinine: 1.11 mg/dL (ref 0.61–1.24)
GFR, Estimated: >60 mL/min (ref >60)
Glucose, Bld: 108 mg/dL — ABNORMAL HIGH (ref 70–99)
Potassium: 4.3 mmol/L (ref 3.5–5.1)
Sodium: 136 mmol/L (ref 135–145)
Total Bilirubin: 0.3 mg/dL (ref 0.3–1.2)
Total Protein: 6.8 g/dL (ref 6.5–8.1)

## 2020-04-01 LAB — CBC WITH DIFFERENTIAL (CANCER CENTER ONLY)
Abs Immature Granulocytes: 0.01 10*3/uL (ref 0.00–0.07)
Basophils Absolute: 0.1 10*3/uL (ref 0.0–0.1)
Basophils Relative: 1 %
Eosinophils Absolute: 0.2 10*3/uL (ref 0.0–0.5)
Eosinophils Relative: 3 %
HCT: 33.3 % — ABNORMAL LOW (ref 39.0–52.0)
Hemoglobin: 10.5 g/dL — ABNORMAL LOW (ref 13.0–17.0)
Immature Granulocytes: 0 %
Lymphocytes Relative: 21 %
Lymphs Abs: 1.3 10*3/uL (ref 0.7–4.0)
MCH: 29.8 pg (ref 26.0–34.0)
MCHC: 31.5 g/dL (ref 30.0–36.0)
MCV: 94.6 fL (ref 80.0–100.0)
Monocytes Absolute: 0.5 10*3/uL (ref 0.1–1.0)
Monocytes Relative: 8 %
Neutro Abs: 4.1 10*3/uL (ref 1.7–7.7)
Neutrophils Relative %: 67 %
Platelet Count: 230 10*3/uL (ref 150–400)
RBC: 3.52 MIL/uL — ABNORMAL LOW (ref 4.22–5.81)
RDW: 14.3 % (ref 11.5–15.5)
WBC Count: 6.1 10*3/uL (ref 4.0–10.5)
nRBC: 0 % (ref 0.0–0.2)

## 2020-04-01 MED ORDER — SODIUM CHLORIDE 0.9% FLUSH
10.0000 mL | Freq: Once | INTRAVENOUS | Status: AC
  Administered 2020-04-01: 10 mL
  Filled 2020-04-01: qty 10

## 2020-04-01 MED ORDER — HEPARIN SOD (PORK) LOCK FLUSH 100 UNIT/ML IV SOLN
500.0000 [IU] | Freq: Once | INTRAVENOUS | Status: AC
  Administered 2020-04-01: 500 [IU]
  Filled 2020-04-01: qty 5

## 2020-05-02 ENCOUNTER — Other Ambulatory Visit: Payer: Self-pay

## 2020-05-02 ENCOUNTER — Encounter (HOSPITAL_BASED_OUTPATIENT_CLINIC_OR_DEPARTMENT_OTHER): Payer: Self-pay | Admitting: Emergency Medicine

## 2020-05-02 ENCOUNTER — Inpatient Hospital Stay (HOSPITAL_BASED_OUTPATIENT_CLINIC_OR_DEPARTMENT_OTHER)
Admission: EM | Admit: 2020-05-02 | Discharge: 2020-05-06 | DRG: 309 | Disposition: A | Discharge status: Home / Self Care | Payer: No Typology Code available for payment source | Attending: Internal Medicine | Admitting: Internal Medicine

## 2020-05-02 ENCOUNTER — Emergency Department (HOSPITAL_BASED_OUTPATIENT_CLINIC_OR_DEPARTMENT_OTHER): Payer: No Typology Code available for payment source

## 2020-05-02 DIAGNOSIS — Z936 Other artificial openings of urinary tract status: Secondary | ICD-10-CM

## 2020-05-02 DIAGNOSIS — E039 Hypothyroidism, unspecified: Secondary | ICD-10-CM | POA: Diagnosis present

## 2020-05-02 DIAGNOSIS — I110 Hypertensive heart disease with heart failure: Secondary | ICD-10-CM | POA: Diagnosis present

## 2020-05-02 DIAGNOSIS — N3000 Acute cystitis without hematuria: Secondary | ICD-10-CM

## 2020-05-02 DIAGNOSIS — I471 Supraventricular tachycardia: Principal | ICD-10-CM | POA: Diagnosis present

## 2020-05-02 DIAGNOSIS — D649 Anemia, unspecified: Secondary | ICD-10-CM | POA: Diagnosis present

## 2020-05-02 DIAGNOSIS — Z20822 Contact with and (suspected) exposure to covid-19: Secondary | ICD-10-CM | POA: Diagnosis present

## 2020-05-02 DIAGNOSIS — Z87891 Personal history of nicotine dependence: Secondary | ICD-10-CM

## 2020-05-02 DIAGNOSIS — J449 Chronic obstructive pulmonary disease, unspecified: Secondary | ICD-10-CM | POA: Diagnosis present

## 2020-05-02 DIAGNOSIS — Z7982 Long term (current) use of aspirin: Secondary | ICD-10-CM

## 2020-05-02 DIAGNOSIS — I272 Pulmonary hypertension, unspecified: Secondary | ICD-10-CM | POA: Diagnosis present

## 2020-05-02 DIAGNOSIS — Z8249 Family history of ischemic heart disease and other diseases of the circulatory system: Secondary | ICD-10-CM

## 2020-05-02 DIAGNOSIS — Z7951 Long term (current) use of inhaled steroids: Secondary | ICD-10-CM

## 2020-05-02 DIAGNOSIS — C3431 Malignant neoplasm of lower lobe, right bronchus or lung: Secondary | ICD-10-CM | POA: Diagnosis present

## 2020-05-02 DIAGNOSIS — K219 Gastro-esophageal reflux disease without esophagitis: Secondary | ICD-10-CM | POA: Diagnosis present

## 2020-05-02 DIAGNOSIS — G629 Polyneuropathy, unspecified: Secondary | ICD-10-CM | POA: Diagnosis present

## 2020-05-02 DIAGNOSIS — K5900 Constipation, unspecified: Secondary | ICD-10-CM | POA: Diagnosis not present

## 2020-05-02 DIAGNOSIS — N390 Urinary tract infection, site not specified: Secondary | ICD-10-CM

## 2020-05-02 DIAGNOSIS — I493 Ventricular premature depolarization: Secondary | ICD-10-CM | POA: Diagnosis present

## 2020-05-02 DIAGNOSIS — Z8551 Personal history of malignant neoplasm of bladder: Secondary | ICD-10-CM

## 2020-05-02 DIAGNOSIS — C641 Malignant neoplasm of right kidney, except renal pelvis: Secondary | ICD-10-CM | POA: Diagnosis present

## 2020-05-02 DIAGNOSIS — Z923 Personal history of irradiation: Secondary | ICD-10-CM

## 2020-05-02 DIAGNOSIS — I5042 Chronic combined systolic (congestive) and diastolic (congestive) heart failure: Secondary | ICD-10-CM | POA: Diagnosis present

## 2020-05-02 DIAGNOSIS — N12 Tubulo-interstitial nephritis, not specified as acute or chronic: Secondary | ICD-10-CM

## 2020-05-02 DIAGNOSIS — Z902 Acquired absence of lung [part of]: Secondary | ICD-10-CM

## 2020-05-02 DIAGNOSIS — Z9221 Personal history of antineoplastic chemotherapy: Secondary | ICD-10-CM

## 2020-05-02 DIAGNOSIS — R55 Syncope and collapse: Secondary | ICD-10-CM

## 2020-05-02 DIAGNOSIS — E785 Hyperlipidemia, unspecified: Secondary | ICD-10-CM | POA: Diagnosis present

## 2020-05-02 DIAGNOSIS — Z79899 Other long term (current) drug therapy: Secondary | ICD-10-CM

## 2020-05-02 DIAGNOSIS — Y92410 Unspecified street and highway as the place of occurrence of the external cause: Secondary | ICD-10-CM

## 2020-05-02 DIAGNOSIS — Z79890 Hormone replacement therapy (postmenopausal): Secondary | ICD-10-CM

## 2020-05-02 DIAGNOSIS — Z885 Allergy status to narcotic agent status: Secondary | ICD-10-CM

## 2020-05-02 DIAGNOSIS — I10 Essential (primary) hypertension: Secondary | ICD-10-CM | POA: Diagnosis present

## 2020-05-02 HISTORY — DX: Other artificial openings of urinary tract status: Z93.6

## 2020-05-02 LAB — URINALYSIS, ROUTINE W REFLEX MICROSCOPIC
Glucose, UA: NEGATIVE mg/dL
Ketones, ur: NEGATIVE mg/dL
Nitrite: NEGATIVE
Protein, ur: >300 mg/dL — AB
Specific Gravity, Urine: 1.015 (ref 1.005–1.030)
pH: 7 (ref 5.0–8.0)

## 2020-05-02 LAB — COMPREHENSIVE METABOLIC PANEL
ALT: 25 U/L (ref 0–44)
AST: 29 U/L (ref 15–41)
Albumin: 3 g/dL — ABNORMAL LOW (ref 3.5–5.0)
Alkaline Phosphatase: 71 U/L (ref 38–126)
Anion gap: 10 (ref 5–15)
BUN: 21 mg/dL (ref 8–23)
CO2: 24 mmol/L (ref 22–32)
Calcium: 8.6 mg/dL — ABNORMAL LOW (ref 8.9–10.3)
Chloride: 100 mmol/L (ref 98–111)
Creatinine, Ser: 1.19 mg/dL (ref 0.61–1.24)
GFR, Estimated: >60 mL/min (ref >60)
Glucose, Bld: 127 mg/dL — ABNORMAL HIGH (ref 70–99)
Potassium: 3.6 mmol/L (ref 3.5–5.1)
Sodium: 134 mmol/L — ABNORMAL LOW (ref 135–145)
Total Bilirubin: 0.8 mg/dL (ref 0.3–1.2)
Total Protein: 6.9 g/dL (ref 6.5–8.1)

## 2020-05-02 LAB — CBC
HCT: 34.1 % — ABNORMAL LOW (ref 39.0–52.0)
Hemoglobin: 11.3 g/dL — ABNORMAL LOW (ref 13.0–17.0)
MCH: 30.8 pg (ref 26.0–34.0)
MCHC: 33.1 g/dL (ref 30.0–36.0)
MCV: 92.9 fL (ref 80.0–100.0)
Platelets: 216 10*3/uL (ref 150–400)
RBC: 3.67 MIL/uL — ABNORMAL LOW (ref 4.22–5.81)
RDW: 14.9 % (ref 11.5–15.5)
WBC: 10.6 10*3/uL — ABNORMAL HIGH (ref 4.0–10.5)
nRBC: 0 % (ref 0.0–0.2)

## 2020-05-02 LAB — TROPONIN I (HIGH SENSITIVITY)
Troponin I (High Sensitivity): 27 ng/L — ABNORMAL HIGH (ref <18)
Troponin I (High Sensitivity): 30 ng/L — ABNORMAL HIGH (ref <18)

## 2020-05-02 LAB — PROTIME-INR
INR: 1 (ref 0.8–1.2)
Prothrombin Time: 13.5 seconds (ref 11.4–15.2)

## 2020-05-02 LAB — RESP PANEL BY RT-PCR (FLU A&B, COVID) ARPGX2
Influenza A by PCR: NEGATIVE
Influenza B by PCR: NEGATIVE
SARS Coronavirus 2 by RT PCR: NEGATIVE

## 2020-05-02 LAB — URINALYSIS, MICROSCOPIC (REFLEX)
RBC / HPF: >50 RBC/hpf (ref 0–5)
WBC, UA: >50 WBC/hpf (ref 0–5)

## 2020-05-02 LAB — LACTIC ACID, PLASMA: Lactic Acid, Venous: 0.7 mmol/L (ref 0.5–1.9)

## 2020-05-02 LAB — ETHANOL: Alcohol, Ethyl (B): <10 mg/dL (ref <10)

## 2020-05-02 MED ORDER — SODIUM CHLORIDE 0.9 % IV SOLN: INTRAVENOUS | Status: DC | PRN

## 2020-05-02 MED ORDER — IOHEXOL 300 MG/ML  SOLN
100.0000 mL | Freq: Once | INTRAMUSCULAR | Status: AC | PRN
  Administered 2020-05-02: 100 mL via INTRAVENOUS

## 2020-05-02 MED ORDER — SODIUM CHLORIDE 0.9 % IV BOLUS
1000.0000 mL | Freq: Once | INTRAVENOUS | Status: AC
  Administered 2020-05-02: 1000 mL via INTRAVENOUS

## 2020-05-02 MED ORDER — SODIUM CHLORIDE 0.9 % IV SOLN
2.0000 g | Freq: Once | INTRAVENOUS | Status: AC
  Administered 2020-05-02: 2 g via INTRAVENOUS
  Filled 2020-05-02: qty 20

## 2020-05-02 NOTE — ED Notes (Signed)
Attempt made to call pt's wife with room # and to notify of transfer. VM left

## 2020-05-02 NOTE — Plan of Care (Signed)
[facility] will assume care on arrival to accepting facility. Until arrival, care as per EDP. However, [facility] available 24/7 for questions and assistance.  

## 2020-05-02 NOTE — ED Notes (Signed)
Sample to blood bank unavailable at this facility.
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
## 2020-05-03 ENCOUNTER — Inpatient Hospital Stay (HOSPITAL_COMMUNITY): Payer: No Typology Code available for payment source

## 2020-05-03 ENCOUNTER — Observation Stay (HOSPITAL_COMMUNITY): Payer: No Typology Code available for payment source

## 2020-05-03 ENCOUNTER — Encounter (HOSPITAL_COMMUNITY): Payer: Self-pay | Admitting: Internal Medicine

## 2020-05-03 DIAGNOSIS — R55 Syncope and collapse: Secondary | ICD-10-CM

## 2020-05-03 DIAGNOSIS — Z85118 Personal history of other malignant neoplasm of bronchus and lung: Secondary | ICD-10-CM | POA: Diagnosis not present

## 2020-05-03 DIAGNOSIS — Z8551 Personal history of malignant neoplasm of bladder: Secondary | ICD-10-CM | POA: Diagnosis not present

## 2020-05-03 DIAGNOSIS — I700 Atherosclerosis of aorta: Secondary | ICD-10-CM | POA: Diagnosis not present

## 2020-05-03 DIAGNOSIS — I361 Nonrheumatic tricuspid (valve) insufficiency: Secondary | ICD-10-CM | POA: Diagnosis not present

## 2020-05-03 DIAGNOSIS — N210 Calculus in bladder: Secondary | ICD-10-CM | POA: Diagnosis not present

## 2020-05-03 DIAGNOSIS — N3000 Acute cystitis without hematuria: Secondary | ICD-10-CM

## 2020-05-03 DIAGNOSIS — E039 Hypothyroidism, unspecified: Secondary | ICD-10-CM | POA: Diagnosis present

## 2020-05-03 DIAGNOSIS — Z936 Other artificial openings of urinary tract status: Secondary | ICD-10-CM | POA: Diagnosis not present

## 2020-05-03 DIAGNOSIS — I471 Supraventricular tachycardia: Principal | ICD-10-CM

## 2020-05-03 DIAGNOSIS — N390 Urinary tract infection, site not specified: Secondary | ICD-10-CM | POA: Diagnosis not present

## 2020-05-03 DIAGNOSIS — I10 Essential (primary) hypertension: Secondary | ICD-10-CM | POA: Diagnosis not present

## 2020-05-03 DIAGNOSIS — K219 Gastro-esophageal reflux disease without esophagitis: Secondary | ICD-10-CM | POA: Diagnosis present

## 2020-05-03 DIAGNOSIS — Z8249 Family history of ischemic heart disease and other diseases of the circulatory system: Secondary | ICD-10-CM | POA: Diagnosis not present

## 2020-05-03 DIAGNOSIS — Z20822 Contact with and (suspected) exposure to covid-19: Secondary | ICD-10-CM | POA: Diagnosis present

## 2020-05-03 DIAGNOSIS — Z7982 Long term (current) use of aspirin: Secondary | ICD-10-CM | POA: Diagnosis not present

## 2020-05-03 DIAGNOSIS — E785 Hyperlipidemia, unspecified: Secondary | ICD-10-CM | POA: Diagnosis present

## 2020-05-03 DIAGNOSIS — C3431 Malignant neoplasm of lower lobe, right bronchus or lung: Secondary | ICD-10-CM | POA: Diagnosis present

## 2020-05-03 DIAGNOSIS — C661 Malignant neoplasm of right ureter: Secondary | ICD-10-CM | POA: Diagnosis not present

## 2020-05-03 DIAGNOSIS — Z923 Personal history of irradiation: Secondary | ICD-10-CM | POA: Diagnosis not present

## 2020-05-03 DIAGNOSIS — Z9221 Personal history of antineoplastic chemotherapy: Secondary | ICD-10-CM | POA: Diagnosis not present

## 2020-05-03 DIAGNOSIS — K5900 Constipation, unspecified: Secondary | ICD-10-CM | POA: Diagnosis not present

## 2020-05-03 DIAGNOSIS — N12 Tubulo-interstitial nephritis, not specified as acute or chronic: Secondary | ICD-10-CM

## 2020-05-03 DIAGNOSIS — D649 Anemia, unspecified: Secondary | ICD-10-CM | POA: Diagnosis present

## 2020-05-03 DIAGNOSIS — C641 Malignant neoplasm of right kidney, except renal pelvis: Secondary | ICD-10-CM | POA: Diagnosis present

## 2020-05-03 DIAGNOSIS — I493 Ventricular premature depolarization: Secondary | ICD-10-CM | POA: Diagnosis present

## 2020-05-03 DIAGNOSIS — G629 Polyneuropathy, unspecified: Secondary | ICD-10-CM | POA: Diagnosis present

## 2020-05-03 DIAGNOSIS — Z885 Allergy status to narcotic agent status: Secondary | ICD-10-CM | POA: Diagnosis not present

## 2020-05-03 DIAGNOSIS — Y92410 Unspecified street and highway as the place of occurrence of the external cause: Secondary | ICD-10-CM | POA: Diagnosis not present

## 2020-05-03 DIAGNOSIS — I110 Hypertensive heart disease with heart failure: Secondary | ICD-10-CM | POA: Diagnosis present

## 2020-05-03 DIAGNOSIS — N289 Disorder of kidney and ureter, unspecified: Secondary | ICD-10-CM | POA: Diagnosis not present

## 2020-05-03 DIAGNOSIS — Z87891 Personal history of nicotine dependence: Secondary | ICD-10-CM | POA: Diagnosis not present

## 2020-05-03 DIAGNOSIS — I5042 Chronic combined systolic (congestive) and diastolic (congestive) heart failure: Secondary | ICD-10-CM | POA: Diagnosis present

## 2020-05-03 DIAGNOSIS — Z902 Acquired absence of lung [part of]: Secondary | ICD-10-CM | POA: Diagnosis not present

## 2020-05-03 DIAGNOSIS — J449 Chronic obstructive pulmonary disease, unspecified: Secondary | ICD-10-CM | POA: Diagnosis present

## 2020-05-03 DIAGNOSIS — I272 Pulmonary hypertension, unspecified: Secondary | ICD-10-CM | POA: Diagnosis present

## 2020-05-03 LAB — ECHOCARDIOGRAM COMPLETE
AR max vel: 3.14 cm2
AV Area VTI: 3.33 cm2
AV Area mean vel: 3.15 cm2
AV Mean grad: 4 mmHg
AV Peak grad: 6.4 mmHg
Ao pk vel: 1.26 m/s
Area-P 1/2: 4.31 cm2
Height: 69 in
MV VTI: 3.14 cm2
Weight: 3696 oz

## 2020-05-03 LAB — TSH: TSH: 2.935 u[IU]/mL (ref 0.350–4.500)

## 2020-05-03 LAB — CBC WITH DIFFERENTIAL/PLATELET
Abs Immature Granulocytes: 0.03 10*3/uL (ref 0.00–0.07)
Basophils Absolute: 0 10*3/uL (ref 0.0–0.1)
Basophils Relative: 1 %
Eosinophils Absolute: 0.1 10*3/uL (ref 0.0–0.5)
Eosinophils Relative: 2 %
HCT: 30.7 % — ABNORMAL LOW (ref 39.0–52.0)
Hemoglobin: 9.9 g/dL — ABNORMAL LOW (ref 13.0–17.0)
Immature Granulocytes: 0 %
Lymphocytes Relative: 13 %
Lymphs Abs: 1 10*3/uL (ref 0.7–4.0)
MCH: 30.7 pg (ref 26.0–34.0)
MCHC: 32.2 g/dL (ref 30.0–36.0)
MCV: 95 fL (ref 80.0–100.0)
Monocytes Absolute: 0.8 10*3/uL (ref 0.1–1.0)
Monocytes Relative: 10 %
Neutro Abs: 5.5 10*3/uL (ref 1.7–7.7)
Neutrophils Relative %: 74 %
Platelets: 172 10*3/uL (ref 150–400)
RBC: 3.23 MIL/uL — ABNORMAL LOW (ref 4.22–5.81)
RDW: 14.9 % (ref 11.5–15.5)
WBC: 7.4 10*3/uL (ref 4.0–10.5)
nRBC: 0 % (ref 0.0–0.2)

## 2020-05-03 LAB — COMPREHENSIVE METABOLIC PANEL
ALT: 24 U/L (ref 0–44)
AST: 25 U/L (ref 15–41)
Albumin: 2.4 g/dL — ABNORMAL LOW (ref 3.5–5.0)
Alkaline Phosphatase: 62 U/L (ref 38–126)
Anion gap: 7 (ref 5–15)
BUN: 16 mg/dL (ref 8–23)
CO2: 26 mmol/L (ref 22–32)
Calcium: 8.4 mg/dL — ABNORMAL LOW (ref 8.9–10.3)
Chloride: 103 mmol/L (ref 98–111)
Creatinine, Ser: 1.16 mg/dL (ref 0.61–1.24)
GFR, Estimated: >60 mL/min (ref >60)
Glucose, Bld: 114 mg/dL — ABNORMAL HIGH (ref 70–99)
Potassium: 3.8 mmol/L (ref 3.5–5.1)
Sodium: 136 mmol/L (ref 135–145)
Total Bilirubin: 0.6 mg/dL (ref 0.3–1.2)
Total Protein: 5.5 g/dL — ABNORMAL LOW (ref 6.5–8.1)

## 2020-05-03 LAB — MAGNESIUM: Magnesium: 1.9 mg/dL (ref 1.7–2.4)

## 2020-05-03 LAB — TROPONIN I (HIGH SENSITIVITY): Troponin I (High Sensitivity): 41 ng/L — ABNORMAL HIGH (ref <18)

## 2020-05-03 MED ORDER — ACETAMINOPHEN 325 MG PO TABS: 650.0000 mg | ORAL_TABLET | Freq: Four times a day (QID) | ORAL | Status: DC | PRN

## 2020-05-03 MED ORDER — PRIMIDONE 50 MG PO TABS
50.0000 mg | ORAL_TABLET | Freq: Every day | ORAL | Status: DC
  Administered 2020-05-03 – 2020-05-05 (×3): 50 mg via ORAL
  Filled 2020-05-03 (×4): qty 1

## 2020-05-03 MED ORDER — GABAPENTIN 300 MG PO CAPS
300.0000 mg | ORAL_CAPSULE | Freq: Three times a day (TID) | ORAL | Status: DC
  Administered 2020-05-03 – 2020-05-06 (×10): 300 mg via ORAL
  Filled 2020-05-03 (×10): qty 1

## 2020-05-03 MED ORDER — CHLORHEXIDINE GLUCONATE CLOTH 2 % EX PADS
6.0000 | MEDICATED_PAD | Freq: Every day | CUTANEOUS | Status: DC
  Administered 2020-05-04 – 2020-05-06 (×3): 6 via TOPICAL

## 2020-05-03 MED ORDER — POLYETHYLENE GLYCOL 3350 17 G PO PACK
17.0000 g | PACK | Freq: Every day | ORAL | Status: DC
  Administered 2020-05-06: 17 g via ORAL
  Filled 2020-05-03: qty 1

## 2020-05-03 MED ORDER — FLUTICASONE FUROATE-VILANTEROL 100-25 MCG/INH IN AEPB
1.0000 | INHALATION_SPRAY | Freq: Every day | RESPIRATORY_TRACT | Status: DC
  Administered 2020-05-03 – 2020-05-06 (×4): 1 via RESPIRATORY_TRACT
  Filled 2020-05-03: qty 28

## 2020-05-03 MED ORDER — UMECLIDINIUM BROMIDE 62.5 MCG/INH IN AEPB
1.0000 | INHALATION_SPRAY | Freq: Every day | RESPIRATORY_TRACT | Status: DC
  Administered 2020-05-03 – 2020-05-06 (×4): 1 via RESPIRATORY_TRACT
  Filled 2020-05-03: qty 7

## 2020-05-03 MED ORDER — ATORVASTATIN CALCIUM 40 MG PO TABS
40.0000 mg | ORAL_TABLET | Freq: Every day | ORAL | Status: DC
  Administered 2020-05-03 – 2020-05-06 (×4): 40 mg via ORAL
  Filled 2020-05-03 (×4): qty 1

## 2020-05-03 MED ORDER — METOPROLOL TARTRATE 25 MG PO TABS
25.0000 mg | ORAL_TABLET | Freq: Two times a day (BID) | ORAL | Status: DC
  Administered 2020-05-03 – 2020-05-06 (×7): 25 mg via ORAL
  Filled 2020-05-03 (×7): qty 1

## 2020-05-03 MED ORDER — ENOXAPARIN SODIUM 40 MG/0.4ML ~~LOC~~ SOLN
40.0000 mg | Freq: Every day | SUBCUTANEOUS | Status: DC
  Filled 2020-05-03 (×3): qty 0.4

## 2020-05-03 MED ORDER — ACETAMINOPHEN 650 MG RE SUPP: 650.0000 mg | Freq: Four times a day (QID) | RECTAL | Status: DC | PRN

## 2020-05-03 MED ORDER — AMLODIPINE BESYLATE 10 MG PO TABS
10.0000 mg | ORAL_TABLET | Freq: Every day | ORAL | Status: DC
  Administered 2020-05-03 – 2020-05-05 (×3): 10 mg via ORAL
  Filled 2020-05-03 (×3): qty 1

## 2020-05-03 MED ORDER — SODIUM CHLORIDE 0.9 % IV SOLN
1.0000 g | INTRAVENOUS | Status: DC
  Administered 2020-05-03 – 2020-05-04 (×2): 1 g via INTRAVENOUS
  Filled 2020-05-03: qty 1
  Filled 2020-05-03 (×2): qty 10

## 2020-05-03 MED ORDER — ALBUTEROL SULFATE HFA 108 (90 BASE) MCG/ACT IN AERS: 2.0000 | INHALATION_SPRAY | Freq: Four times a day (QID) | RESPIRATORY_TRACT | Status: DC | PRN

## 2020-05-03 MED ORDER — LISINOPRIL 10 MG PO TABS
20.0000 mg | ORAL_TABLET | Freq: Every day | ORAL | Status: DC
  Administered 2020-05-03: 20 mg via ORAL
  Filled 2020-05-03: qty 2

## 2020-05-03 MED ORDER — VITAMIN B-12 1000 MCG PO TABS
1000.0000 ug | ORAL_TABLET | Freq: Every day | ORAL | Status: DC
  Administered 2020-05-03 – 2020-05-06 (×4): 1000 ug via ORAL
  Filled 2020-05-03 (×4): qty 1

## 2020-05-03 MED ORDER — BISACODYL 10 MG RE SUPP: 10.0000 mg | Freq: Every day | RECTAL | Status: DC | PRN

## 2020-05-03 MED ORDER — PANTOPRAZOLE SODIUM 40 MG PO TBEC
40.0000 mg | DELAYED_RELEASE_TABLET | Freq: Every day | ORAL | Status: DC
  Administered 2020-05-03 – 2020-05-06 (×4): 40 mg via ORAL
  Filled 2020-05-03 (×4): qty 1

## 2020-05-03 MED ORDER — ASPIRIN EC 81 MG PO TBEC
81.0000 mg | DELAYED_RELEASE_TABLET | Freq: Every day | ORAL | Status: DC
  Administered 2020-05-03 – 2020-05-06 (×4): 81 mg via ORAL
  Filled 2020-05-03 (×4): qty 1

## 2020-05-03 MED ORDER — LEVOTHYROXINE SODIUM 50 MCG PO TABS
50.0000 ug | ORAL_TABLET | Freq: Every day | ORAL | Status: DC
  Administered 2020-05-03 – 2020-05-06 (×4): 50 ug via ORAL
  Filled 2020-05-03 (×4): qty 1

## 2020-05-03 MED ORDER — LACTULOSE 10 GM/15ML PO SOLN
10.0000 g | Freq: Two times a day (BID) | ORAL | Status: DC
  Administered 2020-05-04 – 2020-05-06 (×3): 10 g via ORAL
  Filled 2020-05-03 (×4): qty 15

## 2020-05-03 MED ORDER — LABETALOL HCL 5 MG/ML IV SOLN: 10.0000 mg | INTRAVENOUS | Status: DC | PRN

## 2020-05-03 MED ORDER — PERFLUTREN LIPID MICROSPHERE
1.0000 mL | INTRAVENOUS | Status: AC | PRN
  Administered 2020-05-03: 3 mL via INTRAVENOUS
  Filled 2020-05-03: qty 10
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
## 2020-05-03 NOTE — Procedures (Signed)
Patient Name: [name]  MRN: [id number]  Epilepsy Attending: Lora Havens  Referring Physician/Provider: Dr Debbe Odea Date: 05/03/2020 Duration: 29.59 mins  Patient history: 75yo m with syncope. EEG to evaluate for seizure  Level of alertness: Awake, asleep  AEDs during EEG study: Gabapentin  Technical aspects: This EEG study was done with scalp electrodes positioned according to the 10-20 International system of electrode placement. Electrical activity was acquired at a sampling rate of 500Hz  and reviewed with a high frequency filter of 70Hz  and a low frequency filter of 1Hz . EEG data were recorded continuously and digitally stored.   Description: The posterior dominant rhythm consists of 9-10 Hz activity of moderate voltage (25-35 uV) seen predominantly in posterior head regions, symmetric and reactive to eye opening and eye closing.Sleep was characterized by vertex waves, maximal frontocentral region. Hyperventilation and photic stimulation were not performed.     IMPRESSION: This study is within normal limits. No seizures or epileptiform discharges were seen throughout the recording.  Nealy Karapetian Barbra Sarks

## 2020-05-03 NOTE — Progress Notes (Signed)
EEG complete - results pending 

## 2020-05-03 NOTE — Consult Note (Signed)
[facility] Nurse ostomy consult note Stoma type/location: RLQ urostomy Stomal assessment/size: not measured today and obscured by mucus in intact pouching system (normal finding for urostomy) Peristomal assessment: patient reports it is intact Treatment options for stomal/peristomal skin: none Output: clear yellow Ostomy pouching:   2-piece urostomy pouching system with adapter:  Pouch is Kellie Simmering [phone], Wafer (Skin barrier) is Kellie Simmering # [phone] and adapter for bottom of urostomy pouch (to attached to bedside drainage bag at night) is Kellie Simmering # [phone] Education provided: None required today Enrolled patient in [place] program: No Patient is established with a supplier   [place] nursing team will not follow, but will remain available to this patient, the nursing and medical teams.  Please re-consult if needed. Thanks, Maudie Flakes, MSN, RN, [place], Arther Abbott  Pager# [phone]

## 2020-05-03 NOTE — Progress Notes (Signed)
Pt experiencing severe dizziness while ambulating and sitting. BP 128/59. O2 94% RA.  MD notified.  Daymon Larsen, RN
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
## 2020-05-03 NOTE — Progress Notes (Signed)
Patient has two urostomy bags for his right and left kidney, patient does not have his regular supplies with him, wound care consult is initiated for the appropriate supplies.

## 2020-05-03 NOTE — Progress Notes (Signed)
  Echocardiogram 2D Echocardiogram has been performed with [facility].  Wesley Harmon 05/03/2020, 5:22 PM

## 2020-05-03 NOTE — Plan of Care (Signed)
POC initiated and progressing. 
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
## 2020-05-03 NOTE — Progress Notes (Signed)
CCMD notified me of patient with 26 runs of atrial tach. Will continue to monitor.

## 2020-05-03 NOTE — Progress Notes (Signed)
Pt arrived to the floor via E-link. Pt is alert and oriented and ambulated to the bed. Stand up scale obtained, CHG bath applied. Cardiac monitoring initiated, patient oriented to room, equipment and staff. [facility] provider paged. We'll continue to monitor.
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
## 2020-05-04 ENCOUNTER — Inpatient Hospital Stay (HOSPITAL_COMMUNITY): Payer: No Typology Code available for payment source

## 2020-05-04 DIAGNOSIS — R55 Syncope and collapse: Secondary | ICD-10-CM | POA: Diagnosis not present

## 2020-05-04 DIAGNOSIS — J449 Chronic obstructive pulmonary disease, unspecified: Secondary | ICD-10-CM

## 2020-05-04 MED ORDER — IOHEXOL 350 MG/ML SOLN
100.0000 mL | Freq: Once | INTRAVENOUS | Status: AC | PRN
  Administered 2020-05-04: 100 mL via INTRAVENOUS
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
## 2020-05-04 NOTE — Progress Notes (Signed)
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 Area (Vmax):    3.14 cm AV Area (Vmean):   3.15  cm AV Area (VTI):     3.33 cm AV Vmax:           126.00 cm/s AV Vmean:          88.200 cm/s AV VTI:            0.216 m AV Peak Grad:      6.4 mmHg AV Mean Grad:      4.0 mmHg LVOT Vmax:         104.00 cm/s LVOT Vmean:        73.000 cm/s LVOT VTI:          0.189 m LVOT/AV VTI ratio: 0.88  AORTA Ao Root diam: 3.70 cm Ao Asc diam:  4.00 cm MITRAL VALVE               TRICUSPID VALVE MV Area (PHT): 4.31 cm    TR Peak grad:   44.4 mmHg MV Area VTI:   3.14 cm    TR Vmax:        333.00 cm/s MV Peak grad:  2.1 mmHg MV Mean grad:  1.0 mmHg    SHUNTS MV Vmax:       0.73 m/s    Systemic VTI:  0.19 m MV Vmean:      41.9 cm/s   Systemic Diam: 2.20 cm MV Decel Time: 176 msec MV E velocity: 51.40 cm/s MV A velocity: 55.70 cm/s MV E/A ratio:  0.92 Rudean Haskell MD Electronically signed by  Rudean Haskell MD Signature Date/Time: 05/03/2020/6:40:36 PM    Final       Scheduled Meds: . amLODipine  10 mg Oral Daily  . aspirin EC  81 mg Oral Daily  . atorvastatin  40 mg Oral Daily  . Chlorhexidine Gluconate Cloth  6 each Topical Daily  . enoxaparin (LOVENOX) injection  40 mg Subcutaneous QHS  . fluticasone furoate-vilanterol  1 puff Inhalation Daily  . gabapentin  300 mg Oral TID  . lactulose  10 g Oral BID  . levothyroxine  50 mcg Oral Daily  . metoprolol tartrate  25 mg Oral BID  . pantoprazole  40 mg Oral Daily  . polyethylene glycol  17 g Oral Daily  . primidone  50 mg Oral QHS  . umeclidinium bromide  1 puff Inhalation Daily  . cyanocobalamin  1,000 mcg Oral Daily   Continuous Infusions: . sodium chloride 10 mL/hr at 05/04/20 0619  . cefTRIAXone (ROCEPHIN)  IV Stopped (05/03/20 2230)     LOS: 1 day      Debbe Odea, MD [facility] Pager: [email or web address] 05/04/2020, 12:19 PM

## 2020-05-04 NOTE — Plan of Care (Signed)
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
## 2020-05-05 ENCOUNTER — Inpatient Hospital Stay (HOSPITAL_COMMUNITY): Payer: No Typology Code available for payment source

## 2020-05-05 DIAGNOSIS — N3000 Acute cystitis without hematuria: Secondary | ICD-10-CM

## 2020-05-05 DIAGNOSIS — B9689 Other specified bacterial agents as the cause of diseases classified elsewhere: Secondary | ICD-10-CM

## 2020-05-05 DIAGNOSIS — N289 Disorder of kidney and ureter, unspecified: Secondary | ICD-10-CM

## 2020-05-05 DIAGNOSIS — K5900 Constipation, unspecified: Secondary | ICD-10-CM

## 2020-05-05 DIAGNOSIS — Z936 Other artificial openings of urinary tract status: Secondary | ICD-10-CM

## 2020-05-05 LAB — CBC
HCT: 30.8 % — ABNORMAL LOW (ref 39.0–52.0)
Hemoglobin: 9.9 g/dL — ABNORMAL LOW (ref 13.0–17.0)
MCH: 30.4 pg (ref 26.0–34.0)
MCHC: 32.1 g/dL (ref 30.0–36.0)
MCV: 94.5 fL (ref 80.0–100.0)
Platelets: 223 10*3/uL (ref 150–400)
RBC: 3.26 MIL/uL — ABNORMAL LOW (ref 4.22–5.81)
RDW: 14.7 % (ref 11.5–15.5)
WBC: 7.9 10*3/uL (ref 4.0–10.5)
nRBC: 0 % (ref 0.0–0.2)

## 2020-05-05 MED ORDER — GADOBUTROL 1 MMOL/ML IV SOLN
10.0000 mL | Freq: Once | INTRAVENOUS | Status: AC | PRN
  Administered 2020-05-05: 10 mL via INTRAVENOUS
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
## 2020-05-06 ENCOUNTER — Inpatient Hospital Stay (INDEPENDENT_AMBULATORY_CARE_PROVIDER_SITE_OTHER): Payer: Medicare Other

## 2020-05-06 ENCOUNTER — Other Ambulatory Visit (HOSPITAL_COMMUNITY): Payer: Self-pay

## 2020-05-06 DIAGNOSIS — R55 Syncope and collapse: Secondary | ICD-10-CM

## 2020-05-06 LAB — URINE CULTURE: Culture: >=100000 — AB

## 2020-05-06 MED ORDER — PANTOPRAZOLE SODIUM 40 MG PO TBEC
40.0000 mg | DELAYED_RELEASE_TABLET | Freq: Every day | ORAL | 0 refills | Status: AC
  Filled 2020-05-06: qty 30, 30d supply, fill #0

## 2020-05-06 MED ORDER — METOPROLOL TARTRATE 25 MG PO TABS
25.0000 mg | ORAL_TABLET | Freq: Two times a day (BID) | ORAL | 0 refills | Status: DC
  Filled 2020-05-06: qty 60, 30d supply, fill #0

## 2020-05-06 MED ORDER — HEPARIN SOD (PORK) LOCK FLUSH 100 UNIT/ML IV SOLN
500.0000 [IU] | INTRAVENOUS | Status: DC
  Filled 2020-05-06: qty 5

## 2020-05-06 MED ORDER — HEPARIN SOD (PORK) LOCK FLUSH 100 UNIT/ML IV SOLN
500.0000 [IU] | INTRAVENOUS | Status: DC | PRN
  Administered 2020-05-06: 500 [IU]
  Filled 2020-05-06: qty 5
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
## 2020-05-06 NOTE — Progress Notes (Signed)
   RN informed cardiology of plans to discharge home today. Order placed for live ZIO patch monitor. EKG team to place prior to discharge home.   Abigail Butts, PA-C 05/06/20; 11:39 AM

## 2020-05-06 NOTE — Discharge Summary (Signed)
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 with increase pericardial pressures. No pericardial effusion; otherwise difficult comparison. FINDINGS  Left Ventricle: Left ventricular ejection fraction, by estimation, is 45 to 50%. The left ventricle has mildly decreased function. The left ventricle demonstrates global hypokinesis. The left ventricular internal cavity size was normal in size. There is  no left ventricular hypertrophy. Left ventricular diastolic parameters are consistent with Grade I diastolic dysfunction (impaired relaxation). Right Ventricle: The right ventricular size is severely enlarged. No increase in right ventricular wall thickness. Right ventricular systolic function is mildly reduced. There is moderately elevated pulmonary artery systolic pressure. The tricuspid regurgitant velocity is 3.33 m/s, and with an assumed right atrial pressure of 3 mmHg, the estimated right ventricular systolic pressure is 19.3 mmHg. Left Atrium: Left atrial size was normal in size. Right Atrium: Right atrial size was severely dilated. Pericardium: There  is no evidence of pericardial effusion. Mitral Valve: The mitral valve is grossly normal. No evidence of mitral valve regurgitation. No evidence of mitral valve stenosis. MV peak gradient, 2.1 mmHg. The mean mitral valve gradient is 1.0 mmHg. Tricuspid Valve: The tricuspid valve is grossly normal. Tricuspid valve regurgitation is mild to moderate. Aortic Valve: The aortic valve is tricuspid. There is mild aortic valve annular calcification. Aortic valve regurgitation is not visualized. No aortic stenosis is present. Aortic valve mean gradient measures 4.0 mmHg. Aortic valve peak gradient measures 6.4 mmHg. Aortic valve area, by VTI measures 3.33 cm. Pulmonic Valve: The pulmonic valve was grossly normal. Pulmonic valve regurgitation is not visualized. No evidence of pulmonic stenosis. Aorta: Aortic dilatation noted. There is borderline dilatation of the aortic root, measuring 37 mm. There is mild dilatation of the ascending aorta, measuring 40 mm. Venous: The inferior vena cava is dilated in size with greater than 50% respiratory variability, suggesting right atrial pressure of 8 mmHg. IAS/Shunts: The atrial septum is grossly normal.  LEFT VENTRICLE PLAX 2D LVOT diam:     2.20 cm  Diastology LV SV:  72       LV e' medial:    7.18 cm/s LV SV Index:   33       LV E/e' medial:  7.2 LVOT Area:     3.80 cm LV e' lateral:   14.00 cm/s                         LV E/e' lateral: 3.7  RIGHT VENTRICLE            IVC RV Basal diam:  4.50 cm    IVC diam: 2.10 cm RV Mid diam:    5.80 cm RV S prime:     9.90 cm/s TAPSE (M-mode): 1.3 cm LEFT ATRIUM           Index       RIGHT ATRIUM           Index LA Vol (A4C): 40.5 ml 18.44 ml/m RA Area:     28.50 cm                                   RA Volume:   117.00 ml 53.27 ml/m  AORTIC VALVE AV Area (Vmax):    3.14 cm AV Area (Vmean):   3.15 cm AV Area (VTI):     3.33 cm AV Vmax:           126.00 cm/s AV Vmean:          88.200 cm/s AV VTI:            0.216 m AV Peak Grad:       6.4 mmHg AV Mean Grad:      4.0 mmHg LVOT Vmax:         104.00 cm/s LVOT Vmean:        73.000 cm/s LVOT VTI:          0.189 m LVOT/AV VTI ratio: 0.88  AORTA Ao Root diam: 3.70 cm Ao Asc diam:  4.00 cm MITRAL VALVE               TRICUSPID VALVE MV Area (PHT): 4.31 cm    TR Peak grad:   44.4 mmHg MV Area VTI:   3.14 cm    TR Vmax:        333.00 cm/s MV Peak grad:  2.1 mmHg MV Mean grad:  1.0 mmHg    SHUNTS MV Vmax:       0.73 m/s    Systemic VTI:  0.19 m MV Vmean:      41.9 cm/s   Systemic Diam: 2.20 cm MV Decel Time: 176 msec MV E velocity: 51.40 cm/s MV A velocity: 55.70 cm/s MV E/A ratio:  0.92 Rudean Haskell MD Electronically signed by Rudean Haskell MD Signature Date/Time: 05/03/2020/6:40:36 PM    Final     Subjective: Very eager to go home  Discharge Exam: Vitals:   05/06/20 1108 05/06/20 1110  BP:  (!) 155/73  Pulse:  72  Resp: 19 20  Temp:  98.8 F (37.1 C)  SpO2:  98%   Vitals:   05/06/20 0806 05/06/20 0812 05/06/20 1108 05/06/20 1110  BP:  136/72  (!) 155/73  Pulse:  67  72  Resp:  20 19 20   Temp:  97.6 F (36.4 C)  98.8 F (37.1 C)  TempSrc:  Oral  Oral  SpO2: 94% 98%  98%  Weight:  Height:        General: Pt is alert, awake, not in acute distress Cardiovascular: RRR, S1/S2 +, no rubs, no gallops Respiratory: CTA bilaterally, no wheezing, no rhonchi Abdominal: Soft, NT, ND, bowel sounds + Extremities: no edema, no cyanosis   The results of significant diagnostics from this hospitalization (including imaging, microbiology, ancillary and laboratory) are listed below for reference.     Microbiology: Recent Results (from the past 240 hour(s))  Urine culture     Status: Abnormal   Collection Time: 05/02/20  7:49 PM   Specimen: Urine, Random  Result Value Ref Range Status   Specimen Description   Final    URINE, RANDOM Performed at [facility], [address] [place], [place] [address]    Special Requests   Final    NONE Performed  at [facility], [address] [place], [place] [address]    Culture >=100,000 COLONIES/mL ENTEROCOCCUS FAECALIS (A)  Final   Report Status 05/06/2020 FINAL  Final   Organism ID, Bacteria ENTEROCOCCUS FAECALIS (A)  Final      Susceptibility   Enterococcus faecalis - MIC*    AMPICILLIN <=2 SENSITIVE Sensitive     NITROFURANTOIN <=16 SENSITIVE Sensitive     VANCOMYCIN 1 SENSITIVE Sensitive     * >=100,000 COLONIES/mL ENTEROCOCCUS FAECALIS  Resp Panel by RT-PCR (Flu A&B, Covid) Nasopharyngeal Swab     Status: None   Collection Time: 05/02/20  9:35 PM   Specimen: Nasopharyngeal Swab; Nasopharyngeal(NP) swabs in vial transport medium  Result Value Ref Range Status   SARS Coronavirus 2 by RT PCR NEGATIVE NEGATIVE Final    Comment: (NOTE) SARS-CoV-2 target nucleic acids are NOT DETECTED.  The SARS-CoV-2 RNA is generally detectable in upper respiratory specimens during the acute phase of infection. The lowest concentration of SARS-CoV-2 viral copies this assay can detect is 138 copies/mL. A negative result does not preclude SARS-Cov-2 infection and should not be used as the sole basis for treatment or other patient management decisions. A negative result may occur with  improper specimen collection/handling, submission of specimen other than nasopharyngeal swab, presence of viral mutation(s) within the areas targeted by this assay, and inadequate number of viral copies(<138 copies/mL). A negative result must be combined with clinical observations, patient history, and epidemiological information. The expected result is Negative.  Fact Sheet for Patients:  EntrepreneurPulse.com.au  Fact Sheet for Healthcare Providers:  IncredibleEmployment.be  This test is no t yet approved or cleared by the [place] FDA and  has been authorized for detection and/or diagnosis of SARS-CoV-2 by FDA under an Emergency Use Authorization (EUA). This EUA  will remain  in effect (meaning this test can be used) for the duration of the COVID-19 declaration under Section 564(b)(1) of the Act, 21 U.S.C.section 360bbb-3(b)(1), unless the authorization is terminated  or revoked sooner.       Influenza A by PCR NEGATIVE NEGATIVE Final   Influenza B by PCR NEGATIVE NEGATIVE Final    Comment: (NOTE) The Xpert Xpress SARS-CoV-2/FLU/RSV plus assay is intended as an aid in the diagnosis of influenza from Nasopharyngeal swab specimens and should not be used as a sole basis for treatment. Nasal washings and aspirates are unacceptable for Xpert Xpress SARS-CoV-2/FLU/RSV testing.  Fact Sheet for Patients: EntrepreneurPulse.com.au  Fact Sheet for Healthcare Providers: IncredibleEmployment.be  This test is not yet approved or cleared by the [place] FDA and has been authorized for detection and/or diagnosis of SARS-CoV-2 by FDA  under an Emergency Use Authorization (EUA). This EUA will remain in effect (meaning this test can be used) for the duration of the COVID-19 declaration under Section 564(b)(1) of the Act, 21 U.S.C. section 360bbb-3(b)(1), unless the authorization is terminated or revoked.  Performed at [facility], [address] [place], [place] [address]      Labs: BNP (last 3 results) No results for input(s): BNP in the last 8760 hours. Basic Metabolic Panel: Recent Labs  Lab 05/02/20 1930 05/03/20 0504  NA 134* 136  K 3.6 3.8  CL 100 103  CO2 24 26  GLUCOSE 127* 114*  BUN 21 16  CREATININE 1.19 1.16  CALCIUM 8.6* 8.4*  MG  --  1.9   Liver Function Tests: Recent Labs  Lab 05/02/20 1930 05/03/20 0504  AST 29 25  ALT 25 24  ALKPHOS 71 62  BILITOT 0.8 0.6  PROT 6.9 5.5*  ALBUMIN 3.0* 2.4*   No results for input(s): LIPASE, AMYLASE in the last 168 hours. No results for input(s): AMMONIA in the last 168 hours. CBC: Recent Labs  Lab 05/02/20 1930  05/03/20 0504 05/05/20 0650  WBC 10.6* 7.4 7.9  NEUTROABS  --  5.5  --   HGB 11.3* 9.9* 9.9*  HCT 34.1* 30.7* 30.8*  MCV 92.9 95.0 94.5  PLT 216 172 223   Cardiac Enzymes: No results for input(s): CKTOTAL, CKMB, CKMBINDEX, TROPONINI in the last 168 hours. BNP: Invalid input(s): POCBNP CBG: No results for input(s): GLUCAP in the last 168 hours. D-Dimer No results for input(s): DDIMER in the last 72 hours. Hgb A1c No results for input(s): HGBA1C in the last 72 hours. Lipid Profile No results for input(s): CHOL, HDL, LDLCALC, TRIG, CHOLHDL, LDLDIRECT in the last 72 hours. Thyroid function studies No results for input(s): TSH, T4TOTAL, T3FREE, THYROIDAB in the last 72 hours.  Invalid input(s): FREET3 Anemia work up No results for input(s): VITAMINB12, FOLATE, FERRITIN, TIBC, IRON, RETICCTPCT in the last 72 hours. Urinalysis    Component Value Date/Time   COLORURINE AMBER (A) 05/02/2020 1949   APPEARANCEUR CLOUDY (A) 05/02/2020 1949   LABSPEC 1.015 05/02/2020 1949   LABSPEC 1.010 09/14/2005 1040   PHURINE 7.0 05/02/2020 1949   GLUCOSEU NEGATIVE 05/02/2020 1949   HGBUR LARGE (A) 05/02/2020 1949   BILIRUBINUR SMALL (A) 05/02/2020 1949   BILIRUBINUR Negative 09/14/2005 [place] NEGATIVE 05/02/2020 1949   PROTEINUR >300 (A) 05/02/2020 1949   NITRITE NEGATIVE 05/02/2020 1949   LEUKOCYTESUR LARGE (A) 05/02/2020 1949   LEUKOCYTESUR Negative 09/14/2005 1040   Sepsis Labs Invalid input(s): PROCALCITONIN,  WBC,  LACTICIDVEN Microbiology Recent Results (from the past 240 hour(s))  Urine culture     Status: Abnormal   Collection Time: 05/02/20  7:49 PM   Specimen: Urine, Random  Result Value Ref Range Status   Specimen Description   Final    URINE, RANDOM Performed at [facility], [address] [place], [place] [address]    Special Requests   Final    NONE Performed at [facility], [address] [place], [place] [address]    Culture  >=100,000 COLONIES/mL ENTEROCOCCUS FAECALIS (A)  Final   Report Status 05/06/2020 FINAL  Final   Organism ID, Bacteria ENTEROCOCCUS FAECALIS (A)  Final      Susceptibility   Enterococcus faecalis - MIC*    AMPICILLIN <=2 SENSITIVE Sensitive     NITROFURANTOIN <=16 SENSITIVE Sensitive     VANCOMYCIN  1 SENSITIVE Sensitive     * >=100,000 COLONIES/mL ENTEROCOCCUS FAECALIS  Resp Panel by RT-PCR (Flu A&B, Covid) Nasopharyngeal Swab     Status: None   Collection Time: 05/02/20  9:35 PM   Specimen: Nasopharyngeal Swab; Nasopharyngeal(NP) swabs in vial transport medium  Result Value Ref Range Status   SARS Coronavirus 2 by RT PCR NEGATIVE NEGATIVE Final    Comment: (NOTE) SARS-CoV-2 target nucleic acids are NOT DETECTED.  The SARS-CoV-2 RNA is generally detectable in upper respiratory specimens during the acute phase of infection. The lowest concentration of SARS-CoV-2 viral copies this assay can detect is 138 copies/mL. A negative result does not preclude SARS-Cov-2 infection and should not be used as the sole basis for treatment or other patient management decisions. A negative result may occur with  improper specimen collection/handling, submission of specimen other than nasopharyngeal swab, presence of viral mutation(s) within the areas targeted by this assay, and inadequate number of viral copies(<138 copies/mL). A negative result must be combined with clinical observations, patient history, and epidemiological information. The expected result is Negative.  Fact Sheet for Patients:  EntrepreneurPulse.com.au  Fact Sheet for Healthcare Providers:  IncredibleEmployment.be  This test is no t yet approved or cleared by the [place] FDA and  has been authorized for detection and/or diagnosis of SARS-CoV-2 by FDA under an Emergency Use Authorization (EUA). This EUA will remain  in effect (meaning this test can be used) for the duration of  the COVID-19 declaration under Section 564(b)(1) of the Act, 21 U.S.C.section 360bbb-3(b)(1), unless the authorization is terminated  or revoked sooner.       Influenza A by PCR NEGATIVE NEGATIVE Final   Influenza B by PCR NEGATIVE NEGATIVE Final    Comment: (NOTE) The Xpert Xpress SARS-CoV-2/FLU/RSV plus assay is intended as an aid in the diagnosis of influenza from Nasopharyngeal swab specimens and should not be used as a sole basis for treatment. Nasal washings and aspirates are unacceptable for Xpert Xpress SARS-CoV-2/FLU/RSV testing.  Fact Sheet for Patients: EntrepreneurPulse.com.au  Fact Sheet for Healthcare Providers: IncredibleEmployment.be  This test is not yet approved or cleared by the [place] FDA and has been authorized for detection and/or diagnosis of SARS-CoV-2 by FDA under an Emergency Use Authorization (EUA). This EUA will remain in effect (meaning this test can be used) for the duration of the COVID-19 declaration under Section 564(b)(1) of the Act, 21 U.S.C. section 360bbb-3(b)(1), unless the authorization is terminated or revoked.  Performed at [facility], [address] [place], [place] [address]    Time spent: 30 min  SIGNED:   Marylu Lund, MD  [facility] 05/06/2020, 4:51 PM  If 7PM-7AM, please contact night-coverage

## 2020-05-06 NOTE — Progress Notes (Signed)
Zio patch placed onto patient.  All instructions and information reviewed with patient, they verbalize understanding with no questions. 

## 2020-05-27 ENCOUNTER — Inpatient Hospital Stay: Payer: Medicare Other | Attending: Internal Medicine

## 2020-05-27 ENCOUNTER — Other Ambulatory Visit: Payer: Self-pay

## 2020-05-27 ENCOUNTER — Other Ambulatory Visit: Payer: Medicare Other

## 2020-05-27 DIAGNOSIS — K435 Parastomal hernia without obstruction or  gangrene: Secondary | ICD-10-CM | POA: Insufficient documentation

## 2020-05-27 DIAGNOSIS — C3432 Malignant neoplasm of lower lobe, left bronchus or lung: Secondary | ICD-10-CM | POA: Diagnosis present

## 2020-05-27 DIAGNOSIS — Z452 Encounter for adjustment and management of vascular access device: Secondary | ICD-10-CM | POA: Diagnosis not present

## 2020-05-27 DIAGNOSIS — J432 Centrilobular emphysema: Secondary | ICD-10-CM | POA: Diagnosis not present

## 2020-05-27 DIAGNOSIS — I700 Atherosclerosis of aorta: Secondary | ICD-10-CM | POA: Diagnosis not present

## 2020-05-27 DIAGNOSIS — I471 Supraventricular tachycardia: Secondary | ICD-10-CM | POA: Insufficient documentation

## 2020-05-27 DIAGNOSIS — Z79899 Other long term (current) drug therapy: Secondary | ICD-10-CM | POA: Insufficient documentation

## 2020-05-27 DIAGNOSIS — C679 Malignant neoplasm of bladder, unspecified: Secondary | ICD-10-CM | POA: Diagnosis not present

## 2020-05-27 DIAGNOSIS — N281 Cyst of kidney, acquired: Secondary | ICD-10-CM | POA: Diagnosis not present

## 2020-05-27 DIAGNOSIS — Z936 Other artificial openings of urinary tract status: Secondary | ICD-10-CM | POA: Diagnosis not present

## 2020-05-27 DIAGNOSIS — Z885 Allergy status to narcotic agent status: Secondary | ICD-10-CM | POA: Insufficient documentation

## 2020-05-27 DIAGNOSIS — N029 Recurrent and persistent hematuria with unspecified morphologic changes: Secondary | ICD-10-CM | POA: Insufficient documentation

## 2020-05-27 DIAGNOSIS — Z95828 Presence of other vascular implants and grafts: Secondary | ICD-10-CM

## 2020-05-27 DIAGNOSIS — R5383 Other fatigue: Secondary | ICD-10-CM | POA: Insufficient documentation

## 2020-05-27 MED ORDER — SODIUM CHLORIDE 0.9% FLUSH
10.0000 mL | Freq: Once | INTRAVENOUS | Status: AC
  Administered 2020-05-27: 10 mL
  Filled 2020-05-27: qty 10

## 2020-05-27 MED ORDER — HEPARIN SOD (PORK) LOCK FLUSH 100 UNIT/ML IV SOLN
500.0000 [IU] | Freq: Once | INTRAVENOUS | Status: AC
  Administered 2020-05-27: 500 [IU]
  Filled 2020-05-27: qty 5

## 2020-05-29 ENCOUNTER — Telehealth: Payer: Self-pay | Admitting: Internal Medicine

## 2020-05-29 NOTE — Telephone Encounter (Signed)
Called pt to r/s appt per 5/13 sch msg. Pt said he will get his CT scan r/s and then call back to r/s the lab for same day a scan. I gave pt [facility]'s number to call to r/s scan.

## 2020-06-03 ENCOUNTER — Ambulatory Visit: Payer: Medicare Other | Admitting: Physician Assistant

## 2020-06-03 ENCOUNTER — Other Ambulatory Visit: Payer: Self-pay

## 2020-06-03 ENCOUNTER — Other Ambulatory Visit: Payer: Self-pay | Admitting: Medical Oncology

## 2020-06-03 DIAGNOSIS — I471 Supraventricular tachycardia: Secondary | ICD-10-CM

## 2020-06-03 DIAGNOSIS — C3432 Malignant neoplasm of lower lobe, left bronchus or lung: Secondary | ICD-10-CM

## 2020-06-03 DIAGNOSIS — I491 Atrial premature depolarization: Secondary | ICD-10-CM

## 2020-06-04 ENCOUNTER — Inpatient Hospital Stay: Payer: Medicare Other

## 2020-06-04 ENCOUNTER — Ambulatory Visit (HOSPITAL_COMMUNITY)
Admission: RE | Admit: 2020-06-04 | Discharge: 2020-06-04 | Disposition: A | Discharge status: Home / Self Care | Payer: Medicare Other | Source: Ambulatory Visit | Attending: Internal Medicine | Admitting: Internal Medicine

## 2020-06-04 ENCOUNTER — Other Ambulatory Visit: Payer: Self-pay

## 2020-06-04 DIAGNOSIS — C3432 Malignant neoplasm of lower lobe, left bronchus or lung: Secondary | ICD-10-CM | POA: Diagnosis not present

## 2020-06-04 DIAGNOSIS — Z95828 Presence of other vascular implants and grafts: Secondary | ICD-10-CM

## 2020-06-04 DIAGNOSIS — C3490 Malignant neoplasm of unspecified part of unspecified bronchus or lung: Secondary | ICD-10-CM

## 2020-06-04 LAB — CBC WITH DIFFERENTIAL (CANCER CENTER ONLY)
Abs Immature Granulocytes: 0.02 10*3/uL (ref 0.00–0.07)
Basophils Absolute: 0.1 10*3/uL (ref 0.0–0.1)
Basophils Relative: 1 %
Eosinophils Absolute: 0.3 10*3/uL (ref 0.0–0.5)
Eosinophils Relative: 4 %
HCT: 34.8 % — ABNORMAL LOW (ref 39.0–52.0)
Hemoglobin: 11.3 g/dL — ABNORMAL LOW (ref 13.0–17.0)
Immature Granulocytes: 0 %
Lymphocytes Relative: 21 %
Lymphs Abs: 1.3 10*3/uL (ref 0.7–4.0)
MCH: 30 pg (ref 26.0–34.0)
MCHC: 32.5 g/dL (ref 30.0–36.0)
MCV: 92.3 fL (ref 80.0–100.0)
Monocytes Absolute: 0.6 10*3/uL (ref 0.1–1.0)
Monocytes Relative: 9 %
Neutro Abs: 4 10*3/uL (ref 1.7–7.7)
Neutrophils Relative %: 65 %
Platelet Count: 186 10*3/uL (ref 150–400)
RBC: 3.77 MIL/uL — ABNORMAL LOW (ref 4.22–5.81)
RDW: 16 % — ABNORMAL HIGH (ref 11.5–15.5)
WBC Count: 6.2 10*3/uL (ref 4.0–10.5)
nRBC: 0 % (ref 0.0–0.2)

## 2020-06-04 LAB — CMP (CANCER CENTER ONLY)
ALT: 8 U/L (ref 0–44)
AST: 22 U/L (ref 15–41)
Albumin: 3.2 g/dL — ABNORMAL LOW (ref 3.5–5.0)
Alkaline Phosphatase: 105 U/L (ref 38–126)
Anion gap: 8 (ref 5–15)
BUN: 27 mg/dL — ABNORMAL HIGH (ref 8–23)
CO2: 28 mmol/L (ref 22–32)
Calcium: 9.1 mg/dL (ref 8.9–10.3)
Chloride: 104 mmol/L (ref 98–111)
Creatinine: 1.18 mg/dL (ref 0.61–1.24)
GFR, Estimated: >60 mL/min (ref >60)
Glucose, Bld: 97 mg/dL (ref 70–99)
Potassium: 4.8 mmol/L (ref 3.5–5.1)
Sodium: 140 mmol/L (ref 135–145)
Total Bilirubin: 0.3 mg/dL (ref 0.3–1.2)
Total Protein: 6.9 g/dL (ref 6.5–8.1)

## 2020-06-04 MED ORDER — SODIUM CHLORIDE 0.9% FLUSH
10.0000 mL | Freq: Once | INTRAVENOUS | Status: AC
Start: 2020-06-04 — End: 2020-06-04
  Administered 2020-06-04: 10 mL
  Filled 2020-06-04: qty 10

## 2020-06-04 NOTE — Progress Notes (Signed)
Pt refused for port to stay accessed for CT appointment, states if they need anything, they always go through his hand.

## 2020-06-05 ENCOUNTER — Ambulatory Visit (HOSPITAL_COMMUNITY): Payer: No Typology Code available for payment source

## 2020-06-05 ENCOUNTER — Inpatient Hospital Stay: Payer: Medicare Other

## 2020-06-08 ENCOUNTER — Inpatient Hospital Stay (HOSPITAL_BASED_OUTPATIENT_CLINIC_OR_DEPARTMENT_OTHER): Payer: Medicare Other | Admitting: Internal Medicine

## 2020-06-08 ENCOUNTER — Other Ambulatory Visit: Payer: Self-pay

## 2020-06-08 VITALS — BP 148/69 | HR 100 | Temp 97.6°F | Resp 20 | Ht 69.0 in | Wt 237.0 lb

## 2020-06-08 DIAGNOSIS — I10 Essential (primary) hypertension: Secondary | ICD-10-CM

## 2020-06-08 DIAGNOSIS — C3490 Malignant neoplasm of unspecified part of unspecified bronchus or lung: Secondary | ICD-10-CM | POA: Diagnosis not present

## 2020-06-08 DIAGNOSIS — C3432 Malignant neoplasm of lower lobe, left bronchus or lung: Secondary | ICD-10-CM | POA: Diagnosis not present

## 2020-06-08 NOTE — Progress Notes (Signed)
[place] [phone]   Fax:([phone]) [place], [place] [address] [place] [address]  PRINCIPAL DIAGNOSIS:  1) new right lower lobe pulmonary nodule suspicious for metachronous lung cancer. 2) Recurrent non-small cell lung cancer initially diagnosed as stage IIIA September 2006.  2) diagnosis of early stage bladder cancer in 2014: Status post resection followed by 6 months of intravesical BCG. 3) He has recurrent hematuria. Repeat cystoscopy and biopsy 01/16/2013: CIS, normal RPGs. CT scan abdomen: bilateral renal cysts. No adenopathy. Cystoscopy and biopsy December 2015: Positive cytologies 4) recurrent non-small cell lung cancer, adenocarcinoma involving the left lower lobe diagnosed in November 2015.  PRIOR THERAPY:  1. Status post 3 cycles of neoadjuvant chemotherapy with carboplatin and docetaxel, last dose was given November 22, 2004. 2. Status post left upper lobectomy with lymph node dissection under the care of Dr. Arlyce Dice on January 11, 2005. 3. Status post pericardial window on February 02, 2005 for evacuation of postoperative pericardial tamponade and the fluid was negative for malignancy. 4. Status post 3 cycles of adjuvant chemotherapy with carboplatin and gemcitabine. Last dose was given May 13, 2005. 5. Status post 5 cycles of systemic chemotherapy with carboplatin, paclitaxel and Avastin for disease recurrence. Last dose was given January 04, 2006 and the patient had stable disease by the end of the last cycle. 6. Status post maintenance treatment with Avastin 15 mg/kg given every 3 weeks. The patient is status post 42 cycles, discontinued on July 02, 2008 after the patient had stable disease for more than 2 years. 7. Status post Bilateral selective ureteral cytology, Bilateral retrograde pyelography, Transurethral resection of bladder tumor, Random bladder biopsies, Prostatic urethral  biopsy and Transrectal biopsy of the prostate under the care of Dr. Rutherford Limerick at [facility]. 8. Status post redo thoracotomy with left pneumonectomy under the care of Dr. Lianne Moris at [facility] on 03/05/2014.  CURRENT THERAPY: Observation.  INTERVAL HISTORY: Wesley Harmon 75 y.o. male returns to the clinic today for 33-month follow-up visit.  The patient is feeling fine today with no concerning complaints.  He denied having any current chest pain but has shortness of breath with exertion and he is on home oxygen.  He denied having any fever or chills.  He has no nausea, vomiting, diarrhea or constipation.  He has a urostomy tube and he is followed by Dr. Jordan Hawks.  He denied having any recent weight loss or night sweats.  He had repeat CT scan of the chest, abdomen pelvis and he is here for evaluation and discussion of his scan results.  MEDICAL HISTORY: Past Medical History:  Diagnosis Date  . Bladder cancer ([place]) 01/02/13  . GERD (gastroesophageal reflux disease)   . Hyperlipemia   . Hypertension   . lung ca dx'd 07/2004   chemo comp 06/2008  . Lung cancer ([place])   . Neuropathy   . Presence of urostomy ([place])     ALLERGIES:  is allergic to dilaudid [hydromorphone hcl].  MEDICATIONS:  Current Outpatient Medications  Medication Sig Dispense Refill  . Albuterol Sulfate 108 (90 Base) MCG/ACT AEPB Inhale 2 puffs into the lungs every 6 (six) hours as needed.  (Patient not taking: No sig reported)    . Ascorbic Acid (VITAMIN C) 1000 MG tablet Take 1,000 mg by mouth every morning.    Marland Kitchen aspirin EC 81 MG tablet Take 81 mg by mouth every morning.    Marland Kitchen  atorvastatin (LIPITOR) 40 MG tablet Take 40 mg by mouth at bedtime.    . Cholecalciferol (VITAMIN D) 50 MCG (2000 UT) tablet Take 2,000 Units by mouth every morning.    . gabapentin (NEURONTIN) 300 MG capsule Take 300 mg by mouth 2 (two) times daily.    . iron polysaccharides (NIFEREX) 150 MG capsule Take 150 mg by mouth at  bedtime.    Marland Kitchen lactulose (CHRONULAC) 10 GM/15ML solution Take 10 g by mouth 2 (two) times daily. (Patient not taking: Reported on 05/03/2020)    . levothyroxine (SYNTHROID) 50 MCG tablet Take 50 mcg by mouth daily before breakfast.    . metoprolol tartrate (LOPRESSOR) 25 MG tablet Take 1 tablet (25 mg total) by mouth 2 (two) times daily. 60 tablet 0  . Multiple Vitamins-Minerals (MULTIVITAMIN WITH MINERALS) tablet Take 1 tablet by mouth every morning.    . pantoprazole (PROTONIX) 40 MG tablet Take 1 tablet (40 mg total) by mouth daily. 30 tablet 0  . polyethylene glycol (MIRALAX / GLYCOLAX) packet Take 17 g by mouth daily as needed (constipation).    . primidone (MYSOLINE) 50 MG tablet Take 150 mg by mouth at bedtime.     No current facility-administered medications for this visit.    REVIEW OF SYSTEMS:  A comprehensive review of systems was negative except for: Constitutional: positive for fatigue Respiratory: positive for dyspnea on exertion   PHYSICAL EXAMINATION: General appearance: alert, cooperative, fatigued and no distress Head: Normocephalic, without obvious abnormality, atraumatic Neck: no adenopathy Lymph nodes: Cervical, supraclavicular, and axillary nodes normal. Resp: Clear to auscultation on the right and absent breath sound on the left Back: symmetric, no curvature. ROM normal. No CVA tenderness. Cardio: regular rate and rhythm, S1, S2 normal, no murmur, click, rub or gallop GI: soft, non-tender; bowel sounds normal; no masses,  no organomegaly Extremities: extremities normal, atraumatic, no cyanosis or edema  ECOG PERFORMANCE STATUS: 1 - Symptomatic but completely ambulatory  Blood pressure (!) 148/69, pulse 100, temperature 97.6 F (36.4 C), temperature source Tympanic, resp. rate 20, height 5\' 9"  (1.753 m), weight 237 lb (107.5 kg), SpO2 100 %.  LABORATORY DATA: Lab Results  Component Value Date   WBC 6.2 06/04/2020   HGB 11.3 (L) 06/04/2020   HCT 34.8 (L)  06/04/2020   MCV 92.3 06/04/2020   PLT 186 06/04/2020      Chemistry      Component Value Date/Time   NA 140 06/04/2020 1202   NA 138 08/24/2016 1501   K 4.8 06/04/2020 1202   K 4.0 08/24/2016 1501   CL 104 06/04/2020 1202   CL 102 09/30/2011 0843   CO2 28 06/04/2020 1202   CO2 26 08/24/2016 1501   BUN 27 (H) 06/04/2020 1202   BUN 23.0 08/24/2016 1501   CREATININE 1.18 06/04/2020 1202   CREATININE 1.1 08/24/2016 1501      Component Value Date/Time   CALCIUM 9.1 06/04/2020 1202   CALCIUM 9.3 08/24/2016 1501   ALKPHOS 105 06/04/2020 1202   ALKPHOS 111 08/24/2016 1501   AST 22 06/04/2020 1202   AST 19 08/24/2016 1501   ALT 8 06/04/2020 1202   ALT 16 08/24/2016 1501   BILITOT 0.3 06/04/2020 1202   BILITOT 0.72 08/24/2016 1501       RADIOGRAPHIC STUDIES: CT Abdomen Pelvis Wo Contrast  Result Date: 06/05/2020 CLINICAL DATA:  Lung cancer, bladder cancer. EXAM: CT CHEST, ABDOMEN AND PELVIS WITHOUT CONTRAST TECHNIQUE: Multidetector CT imaging of the chest, abdomen and pelvis was  performed following the standard protocol without IV contrast. COMPARISON:  CT chest 05/04/2020 and CT chest abdomen pelvis 05/02/2020. MR abdomen 05/05/2020. FINDINGS: CT CHEST FINDINGS Cardiovascular: Left IJ Port-A-Cath terminates in the right atrium. Atherosclerotic calcification of the aorta and coronary arteries. Pulmonic trunk is enlarged. Heart is at the upper limits of normal in size. No pericardial effusion. Mediastinum/Nodes: No pathologically enlarged mediastinal or axillary lymph nodes. Hilar regions are difficult to definitively evaluate without IV contrast. Esophagus is grossly unremarkable. Lungs/Pleura: Left pneumonectomy. Compensatory hypertrophy of the right lung. Apical segment right upper lobe nodule measures 9 mm (7 x 11 mm, 6/21), stable. Centrilobular and paraseptal emphysema. Spiculated nodules in the superior segment right lower lobe measure up to 1.3 x 2.3 cm, medially (6/75),  unchanged. No pleural fluid. Airway is otherwise unremarkable. Musculoskeletal: Degenerative changes in the spine. Left thoracotomy changes. No worrisome lytic or sclerotic lesions. CT ABDOMEN PELVIS FINDINGS Hepatobiliary: Low-attenuation lesion in the left hepatic lobe is characterized as a cyst on 05/05/2020. Liver is otherwise unremarkable. Cholecystectomy. No biliary ductal dilatation. Pancreas: Negative. Spleen: Negative. Adrenals/Urinary Tract: Slight nodular thickening of both adrenal glands, unchanged. Interval decrease in size of a cystic lesion off the anterior interpolar right kidney, now measuring 3.2 cm, compared to 7.9 cm on 05/02/2020. Percutaneous nephrostomy on the right. Additional hyperdense and hypodense lesions in the kidneys measure up to 2.7 cm on the left and were better characterized on 05/05/2020. Ureters are decompressed. Cystectomy with an ileal conduit in the right lower quadrant. Stomach/Bowel: Stomach is decompressed. Unobstructed small bowel extends into a large parastomal hernia in the right lower quadrant. Remainder of the small bowel, appendix and colon are otherwise unremarkable. Vascular/Lymphatic: Atherosclerotic calcification of the aorta. No pathologically enlarged lymph nodes. Lymph node dissection clips are seen along the iliac chains. Reproductive: Prostatectomy. Other: No free fluid. Supraumbilical midline ventral hernia repair. Mesenteries and peritoneum are otherwise unremarkable. Musculoskeletal: Posttraumatic or postoperative changes involving the anterior right iliac wing. Degenerative changes in the spine. No worrisome lytic or sclerotic lesions. IMPRESSION: 1. Left pneumonectomy. Right lung nodules are stable. No evidence of distant metastatic disease. 2. Interval decrease in size of a cystic lesion in the right kidney, better characterized on 05/05/2020. Right percutaneous nephrostomy without hydronephrosis. 3. Cystoprostatectomy with a right lower quadrant ileal  conduit and large parastomal hernia containing unobstructed small bowel. 4. Aortic atherosclerosis (ICD10-I70.0). Coronary artery calcification. 5. Enlarged pulmonic trunk, indicative of pulmonary arterial hypertension. 6.  Emphysema (ICD10-J43.9). Electronically Signed   By: Lorin Picket M.D.   On: 06/05/2020 14:51   CT Chest Wo Contrast  Result Date: 06/05/2020 CLINICAL DATA:  Lung cancer, bladder cancer. EXAM: CT CHEST, ABDOMEN AND PELVIS WITHOUT CONTRAST TECHNIQUE: Multidetector CT imaging of the chest, abdomen and pelvis was performed following the standard protocol without IV contrast. COMPARISON:  CT chest 05/04/2020 and CT chest abdomen pelvis 05/02/2020. MR abdomen 05/05/2020. FINDINGS: CT CHEST FINDINGS Cardiovascular: Left IJ Port-A-Cath terminates in the right atrium. Atherosclerotic calcification of the aorta and coronary arteries. Pulmonic trunk is enlarged. Heart is at the upper limits of normal in size. No pericardial effusion. Mediastinum/Nodes: No pathologically enlarged mediastinal or axillary lymph nodes. Hilar regions are difficult to definitively evaluate without IV contrast. Esophagus is grossly unremarkable. Lungs/Pleura: Left pneumonectomy. Compensatory hypertrophy of the right lung. Apical segment right upper lobe nodule measures 9 mm (7 x 11 mm, 6/21), stable. Centrilobular and paraseptal emphysema. Spiculated nodules in the superior segment right lower lobe measure up to 1.3 x 2.3 cm,  medially (6/75), unchanged. No pleural fluid. Airway is otherwise unremarkable. Musculoskeletal: Degenerative changes in the spine. Left thoracotomy changes. No worrisome lytic or sclerotic lesions. CT ABDOMEN PELVIS FINDINGS Hepatobiliary: Low-attenuation lesion in the left hepatic lobe is characterized as a cyst on 05/05/2020. Liver is otherwise unremarkable. Cholecystectomy. No biliary ductal dilatation. Pancreas: Negative. Spleen: Negative. Adrenals/Urinary Tract: Slight nodular thickening of both  adrenal glands, unchanged. Interval decrease in size of a cystic lesion off the anterior interpolar right kidney, now measuring 3.2 cm, compared to 7.9 cm on 05/02/2020. Percutaneous nephrostomy on the right. Additional hyperdense and hypodense lesions in the kidneys measure up to 2.7 cm on the left and were better characterized on 05/05/2020. Ureters are decompressed. Cystectomy with an ileal conduit in the right lower quadrant. Stomach/Bowel: Stomach is decompressed. Unobstructed small bowel extends into a large parastomal hernia in the right lower quadrant. Remainder of the small bowel, appendix and colon are otherwise unremarkable. Vascular/Lymphatic: Atherosclerotic calcification of the aorta. No pathologically enlarged lymph nodes. Lymph node dissection clips are seen along the iliac chains. Reproductive: Prostatectomy. Other: No free fluid. Supraumbilical midline ventral hernia repair. Mesenteries and peritoneum are otherwise unremarkable. Musculoskeletal: Posttraumatic or postoperative changes involving the anterior right iliac wing. Degenerative changes in the spine. No worrisome lytic or sclerotic lesions. IMPRESSION: 1. Left pneumonectomy. Right lung nodules are stable. No evidence of distant metastatic disease. 2. Interval decrease in size of a cystic lesion in the right kidney, better characterized on 05/05/2020. Right percutaneous nephrostomy without hydronephrosis. 3. Cystoprostatectomy with a right lower quadrant ileal conduit and large parastomal hernia containing unobstructed small bowel. 4. Aortic atherosclerosis (ICD10-I70.0). Coronary artery calcification. 5. Enlarged pulmonic trunk, indicative of pulmonary arterial hypertension. 6.  Emphysema (ICD10-J43.9). Electronically Signed   By: Lorin Picket M.D.   On: 06/05/2020 14:51   LONG TERM MONITOR-LIVE TELEMETRY (3-14 DAYS)  Result Date: 05/27/2020  Patient had a minimum heart rate of 39 bpm (nocturnal), maximum heart rate of 158 bpm, and  average heart rate of 64 bpm.  Predominant underlying rhythm was sinus rhythm.  131 runs of supraventricular tachycardia occurred lasting 20 beats at longest with a max rate of 158 bpm at fastest.  Isolated PACs were frequent (6.3 % [id number]).  Isolated PVCs were occasional (3.8% [id number]).  No evidence of complete heart block.  No triggered and diary events.  Frequent PACs and short runs of SVT.   ASSESSMENT AND PLAN: This is a very pleasant 75 years old white male with: 1)  recurrent non-small cell lung cancer status post several treatment regimen including neoadjuvant chemotherapy followed by left upper lobectomy followed by systemic chemotherapy as well as maintenance treatment with Avastin and has been observation since June of 2010 and with no evidence for disease recurrence until November 2015 when he was found to have hypermetabolic activity in the left lower lobe.. The patient underwent redo thoracotomy with complete left pneumonectomy His imaging studies including CT scan of the chest as well as a PET scan in August 2021 showed a suspicious hypermetabolic nodule in the right lung.  The patient underwent CT-guided biopsy of this lesion but it was negative for malignancy.  He had repeat CT scan of the chest performed recently.  I personally and independently reviewed the scan images and discussed the results with the patient today. Unfortunately scan showed further increase in the size of the irregular solid 2.0 cm superior right lower lobe pulmonary nodule suspicious for metachronous primary bronchogenic carcinoma versus metastasis. This is highly suspicious for  malignancy even with the negative previous biopsy. He underwent SBRT to this lesion under the care of Dr. Lisbeth Renshaw.   The patient is feeling fine today with no concerning complaints. He had repeat CT scan of the chest, abdomen pelvis performed recently.  I personally and independently reviewed the scans and discussed the results with the  patient today. Has a scan showed no concerning findings for disease progression and there was interval decrease in the size of the cystic lesion in the right kidney.  2) recent diagnosis of bladder cancer: Status post resection at [facility].he is currently followed by Dr.Tsivinn at [facility].  The recent biopsy confirmed a high-grade urothelial carcinoma.  The patient is scheduled to see his urologist in few weeks for further evaluation and recommendation regarding management of this condition. The patient will come back for follow-up visit in 6 months for evaluation with repeat CT scan of the chest, abdomen pelvis for restaging of his disease. He was advised to call immediately if he has any concerning symptoms in the interval. The patient voices understanding of current disease status and treatment options and is in agreement with the current care plan.  All questions were answered. The patient knows to call the clinic with any problems, questions or concerns. We can certainly see the patient much sooner if necessary.  Disclaimer: This note was dictated with voice recognition software. Similar sounding words can inadvertently be transcribed and may be missed upon review.

## 2020-06-09 ENCOUNTER — Institutional Professional Consult (permissible substitution): Payer: Medicare Other | Admitting: Internal Medicine

## 2020-06-09 ENCOUNTER — Telehealth: Payer: Self-pay | Admitting: Internal Medicine

## 2020-06-09 NOTE — Telephone Encounter (Signed)
Scheduled per los. Called and left msg. Mailed printout  °

## 2020-07-07 ENCOUNTER — Encounter (INDEPENDENT_AMBULATORY_CARE_PROVIDER_SITE_OTHER): Payer: Self-pay

## 2020-07-07 ENCOUNTER — Other Ambulatory Visit: Payer: Self-pay

## 2020-07-07 ENCOUNTER — Encounter: Payer: Self-pay | Admitting: Family

## 2020-07-07 ENCOUNTER — Ambulatory Visit (INDEPENDENT_AMBULATORY_CARE_PROVIDER_SITE_OTHER): Payer: Medicare Other | Admitting: Family

## 2020-07-07 VITALS — BP 124/60 | HR 78 | Ht 69.0 in | Wt 239.0 lb

## 2020-07-07 DIAGNOSIS — I471 Supraventricular tachycardia: Secondary | ICD-10-CM

## 2020-07-07 DIAGNOSIS — I10 Essential (primary) hypertension: Secondary | ICD-10-CM

## 2020-07-07 DIAGNOSIS — I5041 Acute combined systolic (congestive) and diastolic (congestive) heart failure: Secondary | ICD-10-CM | POA: Diagnosis not present

## 2020-07-07 DIAGNOSIS — I493 Ventricular premature depolarization: Secondary | ICD-10-CM | POA: Diagnosis not present

## 2020-07-07 DIAGNOSIS — I491 Atrial premature depolarization: Secondary | ICD-10-CM | POA: Diagnosis not present

## 2020-07-07 DIAGNOSIS — E782 Mixed hyperlipidemia: Secondary | ICD-10-CM

## 2020-07-07 MED ORDER — METOPROLOL TARTRATE 25 MG PO TABS: 25.0000 mg | ORAL_TABLET | Freq: Two times a day (BID) | ORAL | 0 refills | Status: DC

## 2020-07-07 MED ORDER — METOPROLOL TARTRATE 25 MG PO TABS: 25.0000 mg | ORAL_TABLET | Freq: Two times a day (BID) | ORAL | 1 refills | Status: DC
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
## 2020-07-07 NOTE — Patient Instructions (Signed)
Medication Instructions:  Your physician has recommended you make the following change in your medication:  RESUME Metoprolol Tartrate 25mg  twice daily  *If you need a refill on your cardiac medications before your next appointment, please call your pharmacy*   Lab Work: None ordered today.   Testing/Procedures: None ordered today.  Your ZIO monitor showed early beats and some fast beats called SVT. TO ehlp manage these, you have been referred to Dr. Rayann Heman.   Follow-Up: At [facility], you and your health needs are our priority.  As part of our continuing mission to provide you with exceptional heart care, we have created designated Provider Care Teams.  These Care Teams include your primary Cardiologist (physician) and Advanced Practice Providers (APPs -  Physician Assistants and Nurse Practitioners) who all work together to provide you with the care you need, when you need it.  We recommend signing up for the patient portal called "MyChart".  Sign up information is provided on this After Visit Summary.  MyChart is used to connect with patients for Virtual Visits (Telemedicine).  Patients are able to view lab/test results, encounter notes, upcoming appointments, etc.  Non-urgent messages can be sent to your provider as well.   To learn more about what you can do with MyChart, go to [email or web address].    Your next appointment:   As scheduled with Dr. Rayann Heman   Other Instructions  Premature Atrial Contraction  A premature atrial contraction [facility]) is a kind of irregular heartbeat (arrhythmia). It happens when the heart beats too early and then pauses before beatingagain. The heart has four areas, or chambers. Normally, electrical signals spread across the heart and make all the chambers beat together. During a PAC, the upper chambers of the heart (atria) beat too early, before they have had time to fill with blood. The heartbeatpauses afterward so the heart can fill with blood  for the next beat. Sometimes PAC can be a warning sign of another type of arrhythmia called atrial fibrillation. Atrial fibrillation may allow blood to pool in the atria and formclots. If a clot travels to the brain, it can cause a stroke. What are the causes? The cause of this condition is often unknown. Sometimes, this condition may becaused by heart disease or injury to the heart. What increases the risk? You are more likely to develop this condition if: You are a child. You are an adult who is 34 years of age or older. Episodes may be triggered by: Caffeine. Alcohol. Tobacco use. Stimulant drugs. Some medicines or supplements. Stress. Heart disease. What are the signs or symptoms? Symptoms of this condition include: A feeling that your heart skipped a beat. The first heartbeat after the "skipped" beat may feel more forceful. A feeling that your heart is fluttering. How is this diagnosed? This condition is diagnosed based on: Your symptoms. A physical exam. Your health care provider may listen to your heart. An electrocardiogram (ECG). This is a test that records the electrical impulses of the heart. An ambulatory cardiac monitor. This device records your heartbeats for 24 hours or more. You may also have: An echocardiogram to check for any heart conditions. This is a type of imaging test that uses sound waves (ultrasound) to make images of your heart. Blood tests. How is this treated? Treatment depends on the frequency of your symptoms and other risk factors. Treatments may include: Medicines (beta-blockers). Catheter ablation. This is done to destroy the part of the heart tissue that sends abnormal signals. In some  cases, treatment may not be needed for this condition. Follow these instructions at home: Lifestyle Do not use any products that contain nicotine or tobacco, such as cigarettes, e-cigarettes, and chewing tobacco. If you need help quitting, ask your health care  provider. Exercise regularly. Ask your health care provider what type of exercise is safe for you. Find healthy ways to manage stress. Try to get at least 7-9 hours of sleep each night, or as much as recommended by your health care provider. Alcohol use Do not drink alcohol if: Your health care provider tells you not to drink. You are pregnant, may be pregnant, or are planning to become pregnant. Alcohol triggers your episodes. If you drink alcohol: Limit how much you use to: 0-1 drink a day for women. 0-2 drinks a day for men. Be aware of how much alcohol is in your drink. In the [place] one drink equals one 12 oz bottle of beer (355 mL), one 5 oz glass of wine (148 mL), or one 1 oz glass of hard liquor (44 mL). General instructions Take over-the-counter and prescription medicines only as told by your health care provider. If caffeine triggers episodes, do not eat, drink, or use anything with caffeine in it. Keep all follow-up visits as told by your health care provider. This is important. Contact a health care provider if: You feel your heart skipping beats. Your heart skips beats and you feel dizzy, light-headed, or very tired. Get help right away if you have: Chest pain. Trouble breathing. Any symptoms of a stroke. "BE FAST" is an easy way to remember the main warning signs of a stroke. B - Balance. Signs are dizziness, sudden trouble walking, or loss of balance. E - Eyes. Signs are trouble seeing or a sudden change in vision. F - Face. Signs are sudden weakness or numbness of the face, or the face or eyelid drooping on one side. A - Arms. Signs are weakness or numbness in an arm. This happens suddenly and usually on one side of the body. S - Speech. Signs are sudden trouble speaking, slurred speech, or trouble understanding what people say. T - Time. Time to call emergency services. Write down what time symptoms started. Other signs of stroke, such as: A sudden, severe headache  with no known cause. Nausea or vomiting. Seizure. These symptoms may represent a serious problem that is an emergency. Do not wait to see if the symptoms will go away. Get medical help right away. Call your local emergency services (911 in the [place] Do not drive yourself to the hospital. Summary A premature atrial contraction [facility]) is a kind of irregular heartbeat (arrhythmia). It happens when the heart beats too early and then pauses before beating again. Treatment depends on your symptoms and whether you have other underlying heart conditions. Contact a health care provider if your heart skips beats and you feel dizzy, light-headed, or very tired. In some cases, this condition may lead to a stroke. "BE FAST" is an easy way to remember the warning signs of stroke. Get help right away if you have any of the "BE FAST" signs. This information is not intended to replace advice given to you by your health care provider. Make sure you discuss any questions you have with your healthcare provider. Document Revised: 09/28/2017 Document Reviewed: 09/28/2017 Elsevier Patient Education  2022 [place].  Supraventricular Tachycardia, Adult Supraventricular tachycardia (SVT) is a kind of abnormal heartbeat. It makes your heart beat very fast. This may last for  a short time and then return tonormal, or it may last longer. A normal resting heartbeat is 60-100 times a minute. This condition can make your heart beat more than 150 times a minute. Times of having a fast heartbeat (episodes) can be scary, but they are usually not dangerous. In some cases, they may lead to heart failure if they: Happen many times a day. Last longer than a few seconds. What are the causes?  This condition happens when electrical signals are sent out from areas of theheart that do not normally send signals for the heartbeat. What increases the risk? You are more likely to develop this condition if you are: Middle aged or  younger. Male. The following factors may also make you more likely to develop this condition: Stress. Feeling worried or nervous (anxiety). Tiredness. Smoking. Stimulant drugs, such as cocaine and methamphetamine. Alcohol. Caffeine. Pregnancy. Having certain medical conditions. What are the signs or symptoms? A pounding heart. A feeling that your heart is skipping beats (palpitations). Weakness. Trouble getting enough air. Pain or tightness in your chest. Dizziness or feeling like you are going to pass out (faint). Feeling worried or nervous. Sweating. Feeling like you may vomit (nausea). Passing out. Tiredness. Sometimes, there are no symptoms. How is this treated? Treatment may include: Vagal nerve stimulation. Ways to do this include: Holding your breath and pushing, as though you are pooping (having a bowel movement). Massaging an area on one side of your neck. Do not try this yourself. Only a doctor should do this. If done the wrong way, it can lead to a stroke. Bending forward with your head between your legs. Coughing while bending forward with your head between your legs. Putting an ice-cold, wet towel on your face. Medicines that prevent attacks. Medicine to stop an attack given through an IV tube at the hospital. A small electric shock (cardioversion) that stops an attack. A procedure to get rid of cells in the area that is causing the fast heartbeats (radiofrequency ablation). If you do not have symptoms, you may not need treatment. Follow these instructions at home: Stress Avoid things that make you feel stressed. To deal with stress, try: Doing yoga or meditation. Being out in nature. Listening to relaxing music. Doing deep breathing. Taking steps to be healthy, such as getting lots of sleep, exercising, and eating a balanced diet. Talking with a mental health doctor. Lifestyle  Try to get at least 7 hours of sleep each night. Do not smoke or use any  products that contain nicotine or tobacco. If you need help quitting, ask your doctor. Do not drink alcohol if it gives you a fast heartbeat. If alcohol does not seem to give you a fast heartbeat, limit your alcohol use. If you drink alcohol: Limit how much you have to: 0-1 drink a day for women who are not pregnant. 0-2 drinks a day for men. Know how much alcohol is in your drink. In the [place] one drink equals one 12 oz bottle of beer (355 mL), one 5 oz glass of wine (148 mL), or one 1 oz glass of hard liquor (44 mL). Be aware of how caffeine affects you. If caffeine gives you a fast heartbeat, do not eat, drink, or use anything with caffeine in it. If caffeine does not seem to give you a fast heartbeat, limit how much caffeine you eat, drink, or use. Do not use stimulant drugs. If you need help quitting, ask your doctor.  General instructions Stay at  a healthy weight. Exercise regularly. Ask your doctor about good activities for you. Try one or a mixture of these: 150 minutes a week of gentle exercise, like walking or yoga. 75 minutes a week of exercise that is very active, like running or swimming. Do vagus nerve treatments to slow down your heartbeat as told by your doctor. Take over-the-counter and prescription medicines only as told by your doctor. Keep all follow-up visits. Contact a doctor if: You have a fast heartbeat more often. Times of having a fast heartbeat last longer than before. Home treatments to slow down your heartbeat do not help. You have new symptoms. Get help right away if: You have chest pain. Your symptoms get worse. You have trouble breathing. Your heart beats very fast for more than 20 minutes. You pass out. These symptoms may be an emergency. Get medical help right away. Call your local emergency services (911 in the [place] Do not wait to see if the symptoms will go away. Do not drive yourself to the hospital. Summary SVT is a type of abnormal  heartbeat. This condition can make your heart beat more than 150 times a minute. If you do not have symptoms, you may not need treatment. This information is not intended to replace advice given to you by your health care provider. Make sure you discuss any questions you have with your healthcare provider. Document Revised: 08/17/2019 Document Reviewed: 08/17/2019 Elsevier Patient Education  [place].

## 2020-07-22 ENCOUNTER — Inpatient Hospital Stay: Payer: Medicare Other | Attending: Internal Medicine

## 2020-07-22 ENCOUNTER — Other Ambulatory Visit: Payer: Medicare Other

## 2020-07-29 ENCOUNTER — Telehealth: Payer: Self-pay | Admitting: Medical Oncology

## 2020-07-29 NOTE — Telephone Encounter (Signed)
Left arm swelling ( port a cath side)  Pt had post op f/u today at [facility] for uro surgery and hernia repair . He now  resides at [facility].  Jarrett Soho at [facility] called to inform us that pt had LUE  arm swelling -no redness,warmth or compromised blood flow.    She asked if we can follow up with pt.   He only comes here for port flush in Aug.  I spoke to [facility] at  [employer] in [place]. She will follow up and order [place].  No other action taken.

## 2020-09-15 ENCOUNTER — Institutional Professional Consult (permissible substitution): Payer: Medicare Other | Admitting: Internal Medicine

## 2020-09-16 ENCOUNTER — Other Ambulatory Visit: Payer: Medicare Other

## 2020-09-16 ENCOUNTER — Inpatient Hospital Stay: Payer: Medicare Other | Attending: Internal Medicine

## 2020-09-25 ENCOUNTER — Inpatient Hospital Stay: Payer: Medicare Other | Attending: Internal Medicine

## 2020-09-25 ENCOUNTER — Other Ambulatory Visit: Payer: Self-pay

## 2020-09-25 DIAGNOSIS — Z452 Encounter for adjustment and management of vascular access device: Secondary | ICD-10-CM | POA: Diagnosis not present

## 2020-09-25 DIAGNOSIS — C3432 Malignant neoplasm of lower lobe, left bronchus or lung: Secondary | ICD-10-CM

## 2020-09-25 DIAGNOSIS — Z95828 Presence of other vascular implants and grafts: Secondary | ICD-10-CM

## 2020-09-25 DIAGNOSIS — C3490 Malignant neoplasm of unspecified part of unspecified bronchus or lung: Secondary | ICD-10-CM

## 2020-09-25 MED ORDER — SODIUM CHLORIDE 0.9% FLUSH
10.0000 mL | Freq: Once | INTRAVENOUS | Status: AC
  Administered 2020-09-25: 10 mL

## 2020-09-25 MED ORDER — HEPARIN SOD (PORK) LOCK FLUSH 100 UNIT/ML IV SOLN
500.0000 [IU] | Freq: Once | INTRAVENOUS | Status: AC
  Administered 2020-09-25: 500 [IU]

## 2020-09-25 NOTE — Progress Notes (Signed)
Confirmed by Ansyi CMA, pt does not need labs today, only port flush.

## 2020-11-09 ENCOUNTER — Telehealth: Payer: Self-pay | Admitting: Internal Medicine

## 2020-11-09 NOTE — Telephone Encounter (Signed)
Scheduled per 10/24 in basket, pt has been called and confirmed appt

## 2020-11-30 ENCOUNTER — Telehealth: Payer: Self-pay | Admitting: Medical Oncology

## 2020-11-30 NOTE — Telephone Encounter (Signed)
CT cancelled for this month . Lary had CT C/A/P at [facility] on 10/26.

## 2020-12-07 ENCOUNTER — Other Ambulatory Visit: Payer: Self-pay

## 2020-12-07 ENCOUNTER — Other Ambulatory Visit: Payer: Medicare Other

## 2020-12-07 DIAGNOSIS — C3432 Malignant neoplasm of lower lobe, left bronchus or lung: Secondary | ICD-10-CM

## 2020-12-08 ENCOUNTER — Ambulatory Visit (HOSPITAL_COMMUNITY): Payer: No Typology Code available for payment source

## 2020-12-08 ENCOUNTER — Other Ambulatory Visit: Payer: Medicare Other

## 2020-12-09 ENCOUNTER — Other Ambulatory Visit: Payer: Self-pay

## 2020-12-09 ENCOUNTER — Encounter: Payer: Self-pay | Admitting: Internal Medicine

## 2020-12-09 ENCOUNTER — Inpatient Hospital Stay: Payer: Medicare Other

## 2020-12-09 ENCOUNTER — Inpatient Hospital Stay: Payer: Medicare Other | Attending: Internal Medicine | Admitting: Internal Medicine

## 2020-12-09 VITALS — BP 135/61 | HR 73 | Temp 97.3°F | Resp 18 | Ht 69.0 in | Wt 232.2 lb

## 2020-12-09 DIAGNOSIS — N029 Recurrent and persistent hematuria with unspecified morphologic changes: Secondary | ICD-10-CM | POA: Diagnosis not present

## 2020-12-09 DIAGNOSIS — C3432 Malignant neoplasm of lower lobe, left bronchus or lung: Secondary | ICD-10-CM | POA: Diagnosis not present

## 2020-12-09 DIAGNOSIS — R5383 Other fatigue: Secondary | ICD-10-CM | POA: Insufficient documentation

## 2020-12-09 DIAGNOSIS — Z85118 Personal history of other malignant neoplasm of bronchus and lung: Secondary | ICD-10-CM | POA: Diagnosis not present

## 2020-12-09 DIAGNOSIS — Z885 Allergy status to narcotic agent status: Secondary | ICD-10-CM | POA: Insufficient documentation

## 2020-12-09 DIAGNOSIS — C3490 Malignant neoplasm of unspecified part of unspecified bronchus or lung: Secondary | ICD-10-CM

## 2020-12-09 DIAGNOSIS — R55 Syncope and collapse: Secondary | ICD-10-CM | POA: Insufficient documentation

## 2020-12-09 DIAGNOSIS — I10 Essential (primary) hypertension: Secondary | ICD-10-CM | POA: Diagnosis not present

## 2020-12-09 DIAGNOSIS — N281 Cyst of kidney, acquired: Secondary | ICD-10-CM | POA: Insufficient documentation

## 2020-12-09 DIAGNOSIS — R911 Solitary pulmonary nodule: Secondary | ICD-10-CM | POA: Diagnosis not present

## 2020-12-09 DIAGNOSIS — Z95828 Presence of other vascular implants and grafts: Secondary | ICD-10-CM

## 2020-12-09 DIAGNOSIS — R0609 Other forms of dyspnea: Secondary | ICD-10-CM | POA: Diagnosis not present

## 2020-12-09 DIAGNOSIS — Z79899 Other long term (current) drug therapy: Secondary | ICD-10-CM | POA: Insufficient documentation

## 2020-12-09 DIAGNOSIS — Z8551 Personal history of malignant neoplasm of bladder: Secondary | ICD-10-CM | POA: Diagnosis not present

## 2020-12-09 DIAGNOSIS — M6281 Muscle weakness (generalized): Secondary | ICD-10-CM | POA: Insufficient documentation

## 2020-12-09 LAB — CMP (CANCER CENTER ONLY)
ALT: 10 U/L (ref 0–44)
AST: 14 U/L — ABNORMAL LOW (ref 15–41)
Albumin: 3.6 g/dL (ref 3.5–5.0)
Alkaline Phosphatase: 98 U/L (ref 38–126)
Anion gap: 8 (ref 5–15)
BUN: 26 mg/dL — ABNORMAL HIGH (ref 8–23)
CO2: 23 mmol/L (ref 22–32)
Calcium: 8.7 mg/dL — ABNORMAL LOW (ref 8.9–10.3)
Chloride: 108 mmol/L (ref 98–111)
Creatinine: 1.63 mg/dL — ABNORMAL HIGH (ref 0.61–1.24)
GFR, Estimated: 43 mL/min — ABNORMAL LOW (ref >60)
Glucose, Bld: 136 mg/dL — ABNORMAL HIGH (ref 70–99)
Potassium: 3.9 mmol/L (ref 3.5–5.1)
Sodium: 139 mmol/L (ref 135–145)
Total Bilirubin: <0.2 mg/dL — ABNORMAL LOW (ref 0.3–1.2)
Total Protein: 6.9 g/dL (ref 6.5–8.1)

## 2020-12-09 LAB — CBC WITH DIFFERENTIAL (CANCER CENTER ONLY)
Abs Immature Granulocytes: 0.01 10*3/uL (ref 0.00–0.07)
Basophils Absolute: 0.1 10*3/uL (ref 0.0–0.1)
Basophils Relative: 1 %
Eosinophils Absolute: 0.2 10*3/uL (ref 0.0–0.5)
Eosinophils Relative: 3 %
HCT: 35 % — ABNORMAL LOW (ref 39.0–52.0)
Hemoglobin: 11.3 g/dL — ABNORMAL LOW (ref 13.0–17.0)
Immature Granulocytes: 0 %
Lymphocytes Relative: 20 %
Lymphs Abs: 1.4 10*3/uL (ref 0.7–4.0)
MCH: 29.1 pg (ref 26.0–34.0)
MCHC: 32.3 g/dL (ref 30.0–36.0)
MCV: 90.2 fL (ref 80.0–100.0)
Monocytes Absolute: 0.4 10*3/uL (ref 0.1–1.0)
Monocytes Relative: 6 %
Neutro Abs: 4.8 10*3/uL (ref 1.7–7.7)
Neutrophils Relative %: 70 %
Platelet Count: 180 10*3/uL (ref 150–400)
RBC: 3.88 MIL/uL — ABNORMAL LOW (ref 4.22–5.81)
RDW: 14.6 % (ref 11.5–15.5)
WBC Count: 6.8 10*3/uL (ref 4.0–10.5)
nRBC: 0 % (ref 0.0–0.2)

## 2020-12-09 MED ORDER — SODIUM CHLORIDE 0.9% FLUSH
10.0000 mL | Freq: Once | INTRAVENOUS | Status: AC
  Administered 2020-12-09: 10 mL

## 2020-12-09 NOTE — Progress Notes (Signed)
[place] [phone]) [phone]   [phone]) [phone]  OFFICE PROGRESS NOTE  [name], MD (Inactive) [address] [place] [address]  PRINCIPAL DIAGNOSIS:  1) new right lower lobe pulmonary nodule suspicious for metachronous lung cancer. 2) Recurrent non-small cell lung cancer initially diagnosed as stage IIIA September 2006.  2) diagnosis of early stage bladder cancer in 2014: Status post resection followed by 6 months of intravesical BCG. 3) He has recurrent hematuria. Repeat cystoscopy and biopsy 01/16/2013: CIS, normal RPGs. CT scan abdomen: bilateral renal cysts. No adenopathy. Cystoscopy and biopsy December 2015: Positive cytologies 4) recurrent non-small cell lung cancer, adenocarcinoma involving the left lower lobe diagnosed in November 2015.  PRIOR THERAPY:  Status post 3 cycles of neoadjuvant chemotherapy with carboplatin and docetaxel, last dose was given November 22, 2004. Status post left upper lobectomy with lymph node dissection under the care of Dr. Arlyce Dice on January 11, 2005. Status post pericardial window on February 02, 2005 for evacuation of postoperative pericardial tamponade and the fluid was negative for malignancy. Status post 3 cycles of adjuvant chemotherapy with carboplatin and gemcitabine. Last dose was given May 13, 2005. Status post 5 cycles of systemic chemotherapy with carboplatin, paclitaxel and Avastin for disease recurrence. Last dose was given January 04, 2006 and the patient had stable disease by the end of the last cycle. Status post maintenance treatment with Avastin 15 mg/kg given every 3 weeks. The patient is status post 42 cycles, discontinued on July 02, 2008 after the patient had stable disease for more than 2 years. Status post Bilateral selective ureteral cytology, Bilateral retrograde pyelography, Transurethral resection of bladder tumor, Random bladder biopsies, Prostatic urethral biopsy  and Transrectal biopsy of the prostate under the care of Dr. Rutherford Limerick at [facility]. Status post redo thoracotomy with left pneumonectomy under the care of Dr. Lianne Moris at [facility] on 03/05/2014. Status post right nephroureterectomy at [facility] on July 13, 2020  CURRENT THERAPY: Observation.  INTERVAL HISTORY: Wesley Harmon 75 y.o. male returns to the clinic today for follow-up visit accompanied by his pastor.  The patient is feeling fine today with no concerning complaints except for fatigue and generalized weakness.  He has few episodes of passing out and he does not drive anymore.  He denied having any current chest pain, shortness of breath, cough or hemoptysis.  He denied having any fever or chills.  He underwent nephroureterectomy at [facility] on July 13, 2020.  The patient had some gastrointestinal complication from the surgery but this is much better now.  He denied having any current nausea, vomiting, diarrhea or constipation.  He denied having any headache or visual changes.  He had repeat CT scan of the chest, abdomen pelvis performed on November 11, 2020 and he is here for evaluation and discussion of his scan results and treatment options.  MEDICAL HISTORY: Past Medical History:  Diagnosis Date   Bladder cancer ([place]) 01/02/13   GERD (gastroesophageal reflux disease)    Hyperlipemia    Hypertension    lung ca dx'd 07/2004   chemo comp 06/2008   Lung cancer ([place])    Neuropathy    Presence of urostomy (HCC)     ALLERGIES:  is allergic to dilaudid [hydromorphone hcl].  MEDICATIONS:  Current Outpatient Medications  Medication Sig Dispense Refill   Albuterol Sulfate 108 (90 Base) MCG/ACT AEPB Inhale 2 puffs into the lungs every 6 (six) hours as needed.  Ascorbic Acid (VITAMIN C) 1000 MG tablet Take 1,000 mg by mouth every morning.     aspirin EC 81 MG tablet Take 81 mg by mouth every morning.     atorvastatin (LIPITOR) 40 MG tablet Take  40 mg by mouth at bedtime.     Cholecalciferol (VITAMIN D) 50 MCG (2000 UT) tablet Take 2,000 Units by mouth every morning.     gabapentin (NEURONTIN) 300 MG capsule Take 300 mg by mouth 2 (two) times daily.     iron polysaccharides (NIFEREX) 150 MG capsule Take 150 mg by mouth at bedtime.     lactulose (CHRONULAC) 10 GM/15ML solution Take 10 g by mouth 2 (two) times daily.     levothyroxine (SYNTHROID) 50 MCG tablet Take 50 mcg by mouth daily before breakfast.     metoprolol tartrate (LOPRESSOR) 25 MG tablet Take 1 tablet (25 mg total) by mouth 2 (two) times daily. 180 tablet 1   Multiple Vitamins-Minerals (MULTIVITAMIN WITH MINERALS) tablet Take 1 tablet by mouth every morning.     pantoprazole (PROTONIX) 40 MG tablet Take 1 tablet (40 mg total) by mouth daily. 30 tablet 0   polyethylene glycol (MIRALAX / GLYCOLAX) packet Take 17 g by mouth daily as needed (constipation).     primidone (MYSOLINE) 50 MG tablet Take 150 mg by mouth at bedtime.     No current facility-administered medications for this visit.    REVIEW OF SYSTEMS:  A comprehensive review of systems was negative except for: Constitutional: positive for fatigue Respiratory: positive for dyspnea on exertion Musculoskeletal: positive for muscle weakness   PHYSICAL EXAMINATION: General appearance: alert, cooperative, fatigued, and no distress Head: Normocephalic, without obvious abnormality, atraumatic Neck: no adenopathy Lymph nodes: Cervical, supraclavicular, and axillary nodes normal. Resp: Clear to auscultation on the right and absent breath sound on the left Back: symmetric, no curvature. ROM normal. No CVA tenderness. Cardio: regular rate and rhythm, S1, S2 normal, no murmur, click, rub or gallop GI: soft, non-tender; bowel sounds normal; no masses,  no organomegaly Extremities: extremities normal, atraumatic, no cyanosis or edema  ECOG PERFORMANCE STATUS: 2 - Symptomatic, <50% confined to bed  Blood pressure 135/61,  pulse 73, temperature (!) 97.3 F (36.3 C), temperature source Oral, resp. rate 18, height 5\' 9"  (1.753 m), weight 232 lb 3.2 oz (105.3 kg), SpO2 100 %.  LABORATORY DATA: Lab Results  Component Value Date   WBC 6.8 12/09/2020   HGB 11.3 (L) 12/09/2020   HCT 35.0 (L) 12/09/2020   MCV 90.2 12/09/2020   PLT 180 12/09/2020      Chemistry      Component Value Date/Time   NA 140 06/04/2020 1202   NA 138 08/24/2016 1501   K 4.8 06/04/2020 1202   K 4.0 08/24/2016 1501   CL 104 06/04/2020 1202   CL 102 09/30/2011 0843   CO2 28 06/04/2020 1202   CO2 26 08/24/2016 1501   BUN 27 (H) 06/04/2020 1202   BUN 23.0 08/24/2016 1501   CREATININE 1.18 06/04/2020 1202   CREATININE 1.1 08/24/2016 1501      Component Value Date/Time   CALCIUM 9.1 06/04/2020 1202   CALCIUM 9.3 08/24/2016 1501   ALKPHOS 105 06/04/2020 1202   ALKPHOS 111 08/24/2016 1501   AST 22 06/04/2020 1202   AST 19 08/24/2016 1501   ALT 8 06/04/2020 1202   ALT 16 08/24/2016 1501   BILITOT 0.3 06/04/2020 1202   BILITOT 0.72 08/24/2016 1501       RADIOGRAPHIC  STUDIES: No results found. CT scan of the chest, abdomen pelvis performed on November 11, 2020 showed Impression:  1. No evidence of recurrence or metastatic disease in the abdomen or  pelvis.  2. New and increased superior segment of the right lower lobe peripheral  consolidative pulmonary opacities. Findings may be infectious or  inflammatory. Consider CT chest without contrast in 6 weeks to ensure  resolution.  3. New 5 mm right upper lobe pulmonary nodule. Recommend attention on  follow-up.   Electronically Reviewed by:  Runell Gess, MD, [facility]  Electronically Reviewed on:  11/11/2020 10:07 AM   ASSESSMENT AND PLAN: This is a very pleasant 75 years old white male with: 1)  recurrent non-small cell lung cancer status post several treatment regimen including neoadjuvant chemotherapy followed by left upper lobectomy followed by systemic  chemotherapy as well as maintenance treatment with Avastin and has been observation since June of 2010 and with no evidence for disease recurrence until November 2015 when he was found to have hypermetabolic activity in the left lower lobe.. The patient underwent redo thoracotomy with complete left pneumonectomy His imaging studies including CT scan of the chest as well as a PET scan in August 2021 showed a suspicious hypermetabolic nodule in the right lung.  The patient underwent CT-guided biopsy of this lesion but it was negative for malignancy.  He had repeat CT scan of the chest performed recently.  I personally and independently reviewed the scan images and discussed the results with the patient today. Unfortunately scan showed further increase in the size of the irregular solid 2.0 cm superior right lower lobe pulmonary nodule suspicious for metachronous primary bronchogenic carcinoma versus metastasis. This is highly suspicious for malignancy even with the negative previous biopsy. He underwent SBRT to this lesion under the care of Dr. Lisbeth Renshaw.   The patient is feeling fine today with no concerning complaints. He had repeat CT scan of the chest, abdomen pelvis performed on November 11, 2020 and that showed no concerning findings for disease recurrence or progression except for new 0.5 cm right upper lobe pulmonary nodule that require attention on follow-up examination. I recommended for the patient to continue on observation with repeat CT scan of the chest, abdomen and pelvis in 6 months.   2) recent diagnosis of bladder cancer: Status post resection at [facility].he is currently followed by Dr.Tsivinn at [facility].  The recent biopsy confirmed a high-grade urothelial carcinoma.  He underwent right nephroureterectomy at [facility] on July 12, 2020 followed by urology at that facility.  I recommended for the patient to continue on observation and follow with his  urologist at [facility]. The patient was advised to call immediately if he has any other concerning symptoms in the interval.  The patient voices understanding of current disease status and treatment options and is in agreement with the current care plan.  All questions were answered. The patient knows to call the clinic with any problems, questions or concerns. We can certainly see the patient much sooner if necessary.  Disclaimer: This note was dictated with voice recognition software. Similar sounding words can inadvertently be transcribed and may be missed upon review.

## 2021-01-20 ENCOUNTER — Other Ambulatory Visit: Payer: Self-pay | Admitting: Internal Medicine

## 2021-02-05 ENCOUNTER — Telehealth: Payer: Self-pay | Admitting: Medical Oncology

## 2021-02-05 NOTE — Telephone Encounter (Signed)
Pt reports he tested positive for COVID yesterday at his PCP office in [facility] . He requested  port flush appts every 6 weeks.   Schedule message sent.

## 2021-02-11 ENCOUNTER — Other Ambulatory Visit: Payer: Self-pay | Admitting: Internal Medicine

## 2021-03-05 ENCOUNTER — Inpatient Hospital Stay: Payer: Medicare Other | Attending: Internal Medicine

## 2021-03-05 DIAGNOSIS — C3432 Malignant neoplasm of lower lobe, left bronchus or lung: Secondary | ICD-10-CM | POA: Insufficient documentation

## 2021-03-05 DIAGNOSIS — Z452 Encounter for adjustment and management of vascular access device: Secondary | ICD-10-CM | POA: Insufficient documentation

## 2021-03-11 ENCOUNTER — Other Ambulatory Visit: Payer: Self-pay

## 2021-03-11 ENCOUNTER — Inpatient Hospital Stay: Payer: Medicare Other

## 2021-03-11 DIAGNOSIS — C3432 Malignant neoplasm of lower lobe, left bronchus or lung: Secondary | ICD-10-CM

## 2021-03-11 DIAGNOSIS — Z452 Encounter for adjustment and management of vascular access device: Secondary | ICD-10-CM | POA: Diagnosis not present

## 2021-03-11 DIAGNOSIS — Z95828 Presence of other vascular implants and grafts: Secondary | ICD-10-CM

## 2021-03-11 MED ORDER — SODIUM CHLORIDE 0.9% FLUSH
10.0000 mL | Freq: Once | INTRAVENOUS | Status: AC
  Administered 2021-03-11: 10 mL

## 2021-03-11 MED ORDER — HEPARIN SOD (PORK) LOCK FLUSH 100 UNIT/ML IV SOLN
500.0000 [IU] | Freq: Once | INTRAVENOUS | Status: AC
  Administered 2021-03-11: 500 [IU]

## 2021-04-01 ENCOUNTER — Telehealth: Payer: Self-pay | Admitting: Internal Medicine

## 2021-04-01 NOTE — Telephone Encounter (Signed)
.  Called patient to schedule appointment per 3/15 inbasket, patient is aware of date and time.   ?

## 2021-04-06 ENCOUNTER — Encounter: Payer: Self-pay | Admitting: Internal Medicine

## 2021-04-06 ENCOUNTER — Inpatient Hospital Stay: Payer: Medicare Other | Attending: Internal Medicine | Admitting: Internal Medicine

## 2021-04-06 ENCOUNTER — Other Ambulatory Visit: Payer: Self-pay

## 2021-04-06 VITALS — BP 125/66 | HR 83 | Temp 97.9°F | Resp 20 | Ht 69.0 in | Wt 236.6 lb

## 2021-04-06 DIAGNOSIS — N281 Cyst of kidney, acquired: Secondary | ICD-10-CM | POA: Diagnosis not present

## 2021-04-06 DIAGNOSIS — R0609 Other forms of dyspnea: Secondary | ICD-10-CM | POA: Insufficient documentation

## 2021-04-06 DIAGNOSIS — R5383 Other fatigue: Secondary | ICD-10-CM | POA: Diagnosis not present

## 2021-04-06 DIAGNOSIS — Z79899 Other long term (current) drug therapy: Secondary | ICD-10-CM | POA: Insufficient documentation

## 2021-04-06 DIAGNOSIS — R319 Hematuria, unspecified: Secondary | ICD-10-CM | POA: Insufficient documentation

## 2021-04-06 DIAGNOSIS — R918 Other nonspecific abnormal finding of lung field: Secondary | ICD-10-CM | POA: Diagnosis present

## 2021-04-06 DIAGNOSIS — Z885 Allergy status to narcotic agent status: Secondary | ICD-10-CM | POA: Insufficient documentation

## 2021-04-06 DIAGNOSIS — I10 Essential (primary) hypertension: Secondary | ICD-10-CM | POA: Diagnosis not present

## 2021-04-06 DIAGNOSIS — I2699 Other pulmonary embolism without acute cor pulmonale: Secondary | ICD-10-CM | POA: Insufficient documentation

## 2021-04-06 DIAGNOSIS — Z85118 Personal history of other malignant neoplasm of bronchus and lung: Secondary | ICD-10-CM | POA: Diagnosis present

## 2021-04-06 DIAGNOSIS — Z7901 Long term (current) use of anticoagulants: Secondary | ICD-10-CM | POA: Insufficient documentation

## 2021-04-06 DIAGNOSIS — Z8551 Personal history of malignant neoplasm of bladder: Secondary | ICD-10-CM | POA: Diagnosis not present

## 2021-04-06 DIAGNOSIS — I4891 Unspecified atrial fibrillation: Secondary | ICD-10-CM | POA: Diagnosis not present

## 2021-04-06 DIAGNOSIS — C3432 Malignant neoplasm of lower lobe, left bronchus or lung: Secondary | ICD-10-CM | POA: Diagnosis not present

## 2021-04-06 NOTE — Progress Notes (Signed)
?    [place] ?Telephone:(336) [phone]   [phone] ? ?OFFICE PROGRESS NOTE ? ?[name], MD ?[address] [place] [address] ?[place] [address] ? ?PRINCIPAL DIAGNOSIS:  ?1) new right lower lobe pulmonary nodule suspicious for metachronous lung cancer. ?2) Recurrent non-small cell lung cancer initially diagnosed as stage IIIA September 2006.  ?2) diagnosis of early stage bladder cancer in 2014: Status post resection followed by 6 months of intravesical BCG. ?3) He has recurrent hematuria. Repeat cystoscopy and biopsy 01/16/2013: CIS, normal RPGs. CT scan abdomen: bilateral renal cysts. No adenopathy. Cystoscopy and biopsy December 2015: Positive cytologies ?4) recurrent non-small cell lung cancer, adenocarcinoma involving the left lower lobe diagnosed in November 2015. ? ?PRIOR THERAPY:  ?Status post 3 cycles of neoadjuvant chemotherapy with carboplatin and docetaxel, last dose was given November 22, 2004. ?Status post left upper lobectomy with lymph node dissection under the care of Dr. Arlyce Dice on January 11, 2005. ?Status post pericardial window on February 02, 2005 for evacuation of postoperative pericardial tamponade and the fluid was negative for malignancy. ?Status post 3 cycles of adjuvant chemotherapy with carboplatin and gemcitabine. Last dose was given May 13, 2005. ?Status post 5 cycles of systemic chemotherapy with carboplatin, paclitaxel and Avastin for disease recurrence. Last dose was given January 04, 2006 and the patient had stable disease by the end of the last cycle. ?Status post maintenance treatment with Avastin 15 mg/kg given every 3 weeks. The patient is status post 42 cycles, discontinued on July 02, 2008 after the patient had stable disease for more than 2 years. ?Status post Bilateral selective ureteral cytology, Bilateral retrograde pyelography, Transurethral resection of bladder tumor, Random bladder biopsies, Prostatic urethral biopsy and Transrectal  biopsy of the prostate under the care of Dr. Rutherford Limerick at [facility]. ?Status post redo thoracotomy with left pneumonectomy under the care of Dr. Lianne Moris at [facility] on 03/05/2014. ?Status post right nephroureterectomy at [facility] on July 13, 2020 ? ?CURRENT THERAPY: Observation. ? ?INTERVAL HISTORY: ?Wesley Harmon 76 y.o. male returns to the clinic today for follow-up visit accompanied by his wife.  The patient is feeling fine today with no concerning complaints.  He denied having any current chest pain but has shortness of breath with exertion with no cough or hemoptysis.  He denied having any fever or chills.  He has no nausea, vomiting, diarrhea or constipation.  He has no headache or visual changes.  He was seen recently at [facility] by urology and cardiology.  He had repeat CT scan of the chest, abdomen pelvis performed at that time that showed no concerning findings for disease progression except for slight increase of some of the pulmonary nodules.  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  He was started on treatment with Lovenox subcutaneously twice daily.  He was also found to have atrial fibrillation and was seen by cardiology and referred to his local cardiologist at [facility] for further recommendation.  The patient is here today for evaluation and discussion of his condition. ? ?MEDICAL HISTORY: ?Past Medical History:  ?Diagnosis Date  ? Bladder cancer ([place]) 01/02/13  ? GERD (gastroesophageal reflux disease)   ? Hyperlipemia   ? Hypertension   ? lung ca dx'd 07/2004  ? chemo comp 06/2008  ? Lung cancer ([place])   ? Neuropathy   ? Presence of urostomy [facility])   ? ? ?ALLERGIES:  is allergic to dilaudid [hydromorphone hcl] and gemcitabine. ? ?MEDICATIONS:  ?Current Outpatient Medications  ?Medication Sig Dispense Refill  ? Ascorbic Acid (VITAMIN C) 1000 MG tablet Take 1,000 mg by mouth every  morning.    ? aspirin EC 81 MG tablet Take 81 mg by mouth every morning.    ? atorvastatin (LIPITOR) 40 MG tablet Take 40 mg by mouth at bedtime.    ? Cholecalciferol (VITAMIN D) 50 MCG (2000 UT) tablet Take 2,000 Units by mouth every morning.    ? enoxaparin (LOVENOX) 100 MG/ML injection SMARTSIG:100 SUB-Q Daily    ? Ferrous Sulfate (IRON) 325 (65 Fe) MG TABS Take 1 tablet by mouth every other day.    ? gabapentin (NEURONTIN) 300 MG capsule Take 300 mg by mouth 2 (two) times daily.    ? Albuterol Sulfate 108 (90 Base) MCG/ACT AEPB Inhale 2 puffs into the lungs every 6 (six) hours as needed. (Patient not taking: Reported on 04/06/2021)    ? iron polysaccharides (NIFEREX) 150 MG capsule Take 150 mg by mouth at bedtime.    ? levothyroxine (SYNTHROID) 50 MCG tablet Take 50 mcg by mouth daily before breakfast.    ? metoprolol tartrate (LOPRESSOR) 25 MG tablet Take 1 tablet (25 mg total) by mouth 2 (two) times daily. 180 tablet 1  ? Multiple Vitamins-Minerals (MULTIVITAMIN WITH MINERALS) tablet Take 1 tablet by mouth every morning.    ? pantoprazole (PROTONIX) 40 MG tablet Take 1 tablet (40 mg total) by mouth daily. 30 tablet 0  ? polyethylene glycol (MIRALAX / GLYCOLAX) packet Take 17 g by mouth daily as needed (constipation).    ? primidone (MYSOLINE) 50 MG tablet Take 150 mg by mouth at bedtime.    ? ?No current facility-administered medications for this visit.  ? ? ?REVIEW OF SYSTEMS:  A comprehensive review of systems was negative except for: Constitutional: positive for fatigue ?Respiratory: positive for dyspnea on exertion  ? ?PHYSICAL EXAMINATION: General appearance: alert, cooperative, fatigued, and no distress ?Head: Normocephalic, without obvious abnormality, atraumatic ?Neck: no adenopathy ?Lymph nodes: Cervical, supraclavicular, and axillary nodes normal. ?Resp: Clear to auscultation on the right and absent breath sound on the left ?Back: symmetric, no curvature. ROM normal. No CVA tenderness. ?Cardio:  regular rate and rhythm, S1, S2 normal, no murmur, click, rub or gallop ?GI: soft, non-tender; bowel sounds normal; no masses,  no organomegaly ?Extremities: extremities normal, atraumatic, no cyanosis or edema ? ?ECOG PERFORMANCE STATUS: 2 - Symptomatic, <50% confined to bed ? ?Blood pressure 125/66, pulse 83, temperature 97.9 ?F (36.6 ?C), temperature source Tympanic, resp. rate 20, height 5\' 9"  (1.753 m), weight 236 lb 9.6 oz (107.3 kg), SpO2 97 %. ? ?LABORATORY DATA: ?Lab Results  ?Component Value Date  ? WBC 6.8 12/09/2020  ? HGB 11.3 (L) 12/09/2020  ? HCT 35.0 (L) 12/09/2020  ? MCV 90.2 12/09/2020  ? PLT 180 12/09/2020  ? ? ?  Chemistry   ?   ?Component Value Date/Time  ? NA 139 12/09/2020 1451  ? NA 138 08/24/2016 1501  ? K 3.9 12/09/2020 1451  ? K 4.0 08/24/2016 1501  ? CL 108 12/09/2020 1451  ? CL 102 09/30/2011 0843  ? CO2 23 12/09/2020 1451  ? CO2 26 08/24/2016 1501  ? BUN 26 (H) 12/09/2020 1451  ? BUN 23.0 08/24/2016 1501  ? CREATININE 1.63 (H) 12/09/2020 1451  ? CREATININE 1.1 08/24/2016 1501  ?    ?Component Value Date/Time  ? CALCIUM 8.7 (L) 12/09/2020 1451  ?  CALCIUM 9.3 08/24/2016 1501  ? ALKPHOS 98 12/09/2020 1451  ? ALKPHOS 111 08/24/2016 1501  ? AST 14 (L) 12/09/2020 1451  ? AST 19 08/24/2016 1501  ? ALT 10 12/09/2020 1451  ? ALT 16 08/24/2016 1501  ? BILITOT <0.2 (L) 12/09/2020 1451  ? BILITOT 0.72 08/24/2016 1501  ?  ? ? ? ?RADIOGRAPHIC STUDIES: ?No results found. ?CT scan of the chest, abdomen pelvis performed on March 26, 2021 showed ?Impression:  ?1.  No evidence of recurrent or metastatic disease in the abdomen or  ?pelvis.  ?2.  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  ?3.  Slightly increasing strandy and consolidative opacities in the superior  ?right lower lobe, which may be infectious/inflammatory. Underlying neoplasm  ?is not excluded. Continued attention on follow-up exams.  ?4.  Similar scattered pulmonary nodules  in the right lung. Continued  ?attention on follow-up exams.  ? ?Findings #2 were discussed by telephone with Provider Barthel, NP by Dr.  Marland KitchenDorthy Cooler at 03/26/2021 12:21 AM.  ? ?Electronically Reviewed by:  Apolonio Schneiders

## 2021-04-07 ENCOUNTER — Ambulatory Visit
Admission: RE | Admit: 2021-04-07 | Discharge: 2021-04-07 | Disposition: A | Discharge status: Home / Self Care | Payer: Medicare Other | Source: Ambulatory Visit | Attending: Internal Medicine | Admitting: Internal Medicine

## 2021-04-07 ENCOUNTER — Other Ambulatory Visit: Payer: Self-pay | Admitting: Internal Medicine

## 2021-04-07 ENCOUNTER — Encounter: Payer: Self-pay | Admitting: Internal Medicine

## 2021-04-07 DIAGNOSIS — M545 Low back pain, unspecified: Secondary | ICD-10-CM

## 2021-04-08 ENCOUNTER — Emergency Department (HOSPITAL_COMMUNITY): Payer: Medicare Other

## 2021-04-08 ENCOUNTER — Encounter (HOSPITAL_COMMUNITY): Payer: Self-pay | Admitting: Pharmacy Technician

## 2021-04-08 ENCOUNTER — Emergency Department (HOSPITAL_COMMUNITY)
Admission: EM | Admit: 2021-04-08 | Discharge: 2021-04-08 | Disposition: A | Discharge status: Home / Self Care | Payer: Medicare Other | Attending: Emergency Medicine | Admitting: Emergency Medicine

## 2021-04-08 ENCOUNTER — Emergency Department (HOSPITAL_BASED_OUTPATIENT_CLINIC_OR_DEPARTMENT_OTHER): Payer: Medicare Other

## 2021-04-08 ENCOUNTER — Other Ambulatory Visit: Payer: Self-pay

## 2021-04-08 DIAGNOSIS — X501XXA Overexertion from prolonged static or awkward postures, initial encounter: Secondary | ICD-10-CM | POA: Diagnosis not present

## 2021-04-08 DIAGNOSIS — M25551 Pain in right hip: Secondary | ICD-10-CM

## 2021-04-08 DIAGNOSIS — M47816 Spondylosis without myelopathy or radiculopathy, lumbar region: Secondary | ICD-10-CM | POA: Insufficient documentation

## 2021-04-08 DIAGNOSIS — T148XXA Other injury of unspecified body region, initial encounter: Secondary | ICD-10-CM

## 2021-04-08 DIAGNOSIS — Z79899 Other long term (current) drug therapy: Secondary | ICD-10-CM | POA: Insufficient documentation

## 2021-04-08 DIAGNOSIS — S300XXA Contusion of lower back and pelvis, initial encounter: Secondary | ICD-10-CM | POA: Insufficient documentation

## 2021-04-08 DIAGNOSIS — Z7901 Long term (current) use of anticoagulants: Secondary | ICD-10-CM | POA: Insufficient documentation

## 2021-04-08 DIAGNOSIS — M79604 Pain in right leg: Secondary | ICD-10-CM | POA: Diagnosis not present

## 2021-04-08 DIAGNOSIS — I700 Atherosclerosis of aorta: Secondary | ICD-10-CM | POA: Insufficient documentation

## 2021-04-08 DIAGNOSIS — R102 Pelvic and perineal pain: Secondary | ICD-10-CM | POA: Diagnosis present

## 2021-04-08 DIAGNOSIS — S7001XA Contusion of right hip, initial encounter: Secondary | ICD-10-CM | POA: Insufficient documentation

## 2021-04-08 DIAGNOSIS — S79911A Unspecified injury of right hip, initial encounter: Secondary | ICD-10-CM | POA: Diagnosis present

## 2021-04-08 LAB — CBC WITH DIFFERENTIAL/PLATELET
Abs Immature Granulocytes: 0.01 10*3/uL (ref 0.00–0.07)
Basophils Absolute: 0 10*3/uL (ref 0.0–0.1)
Basophils Relative: 1 %
Eosinophils Absolute: 0 10*3/uL (ref 0.0–0.5)
Eosinophils Relative: 0 %
HCT: 54.6 % — ABNORMAL HIGH (ref 39.0–52.0)
Hemoglobin: 17.8 g/dL — ABNORMAL HIGH (ref 13.0–17.0)
Immature Granulocytes: 0 %
Lymphocytes Relative: 8 %
Lymphs Abs: 0.3 10*3/uL — ABNORMAL LOW (ref 0.7–4.0)
MCH: 31.5 pg (ref 26.0–34.0)
MCHC: 32.6 g/dL (ref 30.0–36.0)
MCV: 96.6 fL (ref 80.0–100.0)
Monocytes Absolute: 0.2 10*3/uL (ref 0.1–1.0)
Monocytes Relative: 4 %
Neutro Abs: 3.7 10*3/uL (ref 1.7–7.7)
Neutrophils Relative %: 87 %
Platelets: 63 10*3/uL — ABNORMAL LOW (ref 150–400)
RBC: 5.65 MIL/uL (ref 4.22–5.81)
RDW: 16.6 % — ABNORMAL HIGH (ref 11.5–15.5)
WBC: 4.2 10*3/uL (ref 4.0–10.5)
nRBC: 0 % (ref 0.0–0.2)

## 2021-04-08 LAB — BASIC METABOLIC PANEL
Anion gap: 6 (ref 5–15)
BUN: 34 mg/dL — ABNORMAL HIGH (ref 8–23)
CO2: 24 mmol/L (ref 22–32)
Calcium: 8.7 mg/dL — ABNORMAL LOW (ref 8.9–10.3)
Chloride: 107 mmol/L (ref 98–111)
Creatinine, Ser: 2.11 mg/dL — ABNORMAL HIGH (ref 0.61–1.24)
GFR, Estimated: 32 mL/min — ABNORMAL LOW (ref >60)
Glucose, Bld: 172 mg/dL — ABNORMAL HIGH (ref 70–99)
Potassium: 5.4 mmol/L — ABNORMAL HIGH (ref 3.5–5.1)
Sodium: 137 mmol/L (ref 135–145)

## 2021-04-08 MED ORDER — IOHEXOL 300 MG/ML  SOLN
49.0000 mL | Freq: Once | INTRAMUSCULAR | Status: AC | PRN
  Administered 2021-04-08: 49 mL via INTRAVENOUS

## 2021-04-08 MED ORDER — MORPHINE SULFATE 15 MG PO TABS: 7.5000 mg | ORAL_TABLET | ORAL | 0 refills | Status: DC | PRN

## 2021-04-08 NOTE — ED Notes (Signed)
IV team at bedside 

## 2021-04-08 NOTE — ED Triage Notes (Addendum)
Pt here with reports of having R leg give out on him 2 days ago. Went to PCP and had imaging done. Pt reports unable to bear weight on R hip. Pt also complains of pain to groin and back pain. Pt visibly uncomfortable in triage. Pt pale and diaphoretic.  ?

## 2021-04-08 NOTE — ED Notes (Signed)
Patient transported to CT 

## 2021-04-08 NOTE — Discharge Instructions (Addendum)
Please call your oncologist and let them know that you have a collection of blood in the muscle of your right hip.  See if they are okay with you holding your blood thinning medications for a brief period of time.  Please return for worsening pain fever. ? ?Take tylenol 1000mg (2 extra strength) four times a day.  ? ?Then take the pain medicine if you feel like you need it. Narcotics do not help with the pain, they only make you care about it less.  You can become addicted to this, people may break into your house to steal it.  It will constipate you.  If you drive under the influence of this medicine you can get a DUI.   ? ?

## 2021-04-08 NOTE — ED Notes (Addendum)
Pt has 1+ swelling of right leg, 2+ right pedal pulse, cap refill less than 3 sec, able to wiggle toes. Pt denies numbness and tingling ?

## 2021-04-08 NOTE — ED Provider Notes (Signed)
?[facility] ?Provider Note ? ? ?CSN: [id number] ?Arrival date & time: 04/08/21  1746 ? ?  ? ?History ? ?Chief Complaint  ?Patient presents with  ? Hip Pain  ? ? ?Wesley Harmon is a 76 y.o. male. ? ?76 yo M with a chief complaints of right hip pain.  This been going on for couple days.  He said he was standing and all of a sudden felt the discomfort.  Has pain especially when trying to bear weight on that side.  He denies any trauma denied feeling a pop.  He followed up with his family doctor in the office and they performed an x-ray of his hip and told him to come to the emergency department to be evaluated for a DVT.  Patient is already on Lovenox therapy for PE.  He denies any missed doses. ? ? ?Hip Pain ? ? ?  ? ?Home Medications ?Prior to Admission medications   ?Medication Sig Start Date End Date Taking? Authorizing Provider  ?morphine (MSIR) 15 MG tablet Take 0.5 tablets (7.5 mg total) by mouth every 4 (four) hours as needed for severe pain. 04/08/21  Yes Deno Etienne, DO  ?Albuterol Sulfate 108 (90 Base) MCG/ACT AEPB Inhale 2 puffs into the lungs every 6 (six) hours as needed. ?Patient not taking: Reported on 04/06/2021    [provider]  ?Ascorbic Acid (VITAMIN C) 1000 MG tablet Take 1,000 mg by mouth every morning.    [provider]  ?aspirin EC 81 MG tablet Take 81 mg by mouth every morning.    [provider]  ?atorvastatin (LIPITOR) 40 MG tablet Take 40 mg by mouth at bedtime. 09/03/19   [provider]  ?Cholecalciferol (VITAMIN D) 50 MCG (2000 UT) tablet Take 2,000 Units by mouth every morning.    [provider]  ?enoxaparin (LOVENOX) 100 MG/ML injection SMARTSIG:100 SUB-Q Daily 03/26/21   [provider]  ?Ferrous Sulfate (IRON) 325 (65 Fe) MG TABS Take 1 tablet by mouth every other day. 03/11/21   [provider]  ?gabapentin (NEURONTIN) 300 MG capsule Take 300 mg by mouth 2 (two) times daily.     [provider]  ?iron polysaccharides (NIFEREX) 150 MG capsule Take 150 mg by mouth at bedtime.    [provider]  ?levothyroxine (SYNTHROID) 50 MCG tablet Take 50 mcg by mouth daily before breakfast. 06/18/18   [provider]  ?metoprolol tartrate (LOPRESSOR) 25 MG tablet Take 1 tablet (25 mg total) by mouth 2 (two) times daily. 07/07/20 01/03/21  Loel Dubonnet, NP  ?Multiple Vitamins-Minerals (MULTIVITAMIN WITH MINERALS) tablet Take 1 tablet by mouth every morning.    [provider]  ?pantoprazole (PROTONIX) 40 MG tablet Take 1 tablet (40 mg total) by mouth daily. 05/07/20 07/07/20  Donne Hazel, MD  ?polyethylene glycol [facility] / Floria Raveling) packet Take 17 g by mouth daily as needed (constipation).    [provider]  ?primidone (MYSOLINE) 50 MG tablet Take 150 mg by mouth at bedtime. 12/10/19   [provider]  ?   ? ?Allergies    ?Dilaudid [hydromorphone hcl] and Gemcitabine   ? ?Review of Systems   ?Review of Systems ? ?Physical Exam ?Updated Vital Signs ?BP (!) 130/102   Pulse 84   Temp 98 ?F (36.7 ?C) (Oral)   Resp 18   Ht 5\' 9"  (1.753 m)   Wt 107.3 kg   SpO2 100%   BMI 34.93 kg/m?  ?Physical  Exam ?Vitals and nursing note reviewed.  ?Constitutional:   ?   Appearance: He is well-developed.  ?   Comments: Chronically ill-appearing.  ?HENT:  ?   Head: Normocephalic and atraumatic.  ?Eyes:  ?   Pupils: Pupils are equal, round, and reactive to light.  ?Neck:  ?   Vascular: No JVD.  ?Cardiovascular:  ?   Rate and Rhythm: Normal rate and regular rhythm.  ?   Heart sounds: No murmur heard. ?  No friction rub. No gallop.  ?Pulmonary:  ?   Effort: No respiratory distress.  ?   Breath sounds: No wheezing.  ?Abdominal:  ?   General: There is no distension.  ?   Tenderness: There is no abdominal tenderness. There is no guarding or rebound.  ?Musculoskeletal:     ?   General: Tenderness present. Normal range of motion.  ?   Cervical back: Normal range of  motion and neck supple.  ?   Comments: Pain along the hip adductor muscles especially at the attachment of the to the pubic symphysis.  Able to range the hip without obvious tenderness.  No pain with compression of the pelvis.  No appreciable edema.  Pulse motor and sensation intact distally.  ?Skin: ?   Coloration: Skin is not pale.  ?   Findings: No rash.  ?Neurological:  ?   Mental Status: He is alert and oriented to person, place, and time.  ?Psychiatric:     ?   Behavior: Behavior normal.  ? ? ?ED Results / Procedures / Treatments   ?Labs ?(all labs ordered are listed, but only abnormal results are displayed) ?Labs Reviewed  ?CBC WITH DIFFERENTIAL/PLATELET - Abnormal; Notable for the following components:  ?    Result Value  ? Hemoglobin 17.8 (*)   ? HCT 54.6 (*)   ? RDW 16.6 (*)   ? Platelets 63 (*)   ? Lymphs Abs 0.3 (*)   ? All other components within normal limits  ?BASIC METABOLIC PANEL - Abnormal; Notable for the following components:  ? Potassium 5.4 (*)   ? Glucose, Bld 172 (*)   ? BUN 34 (*)   ? Creatinine, Ser 2.11 (*)   ? Calcium 8.7 (*)   ? GFR, Estimated 32 (*)   ? All other components within normal limits  ? ? ?EKG ?None ? ?Radiology ?DG Lumbar Spine Complete ? ?Result Date: 04/08/2021 ?CLINICAL DATA:  Right-sided low back pain EXAM: LUMBAR SPINE - COMPLETE 4+ VIEW COMPARISON:  04/04/2020 FINDINGS: Five lumbar type vertebral segments. Vertebral body heights and alignment are maintained. No fracture identified. Intervertebral disc spaces are relatively preserved. Minimal degenerative endplate changes. Moderate lower lumbar facet arthrosis. Abdominal aortic atherosclerotic calcification. IMPRESSION: Mild to moderate lumbar spondylosis. No acute findings. Electronically Signed   By: Davina Poke D.O.   On: 04/08/2021 11:34  ? ?CT PELVIS W CONTRAST ? ?Result Date: 04/08/2021 ?CLINICAL DATA:  Soft tissue infection EXAM: CT PELVIS WITH CONTRAST TECHNIQUE: Multidetector CT imaging of the pelvis was  performed using the standard protocol following the bolus administration of intravenous contrast. RADIATION DOSE REDUCTION: This exam was performed according to the departmental dose-optimization program which includes automated exposure control, adjustment of the mA and/or kV according to patient size and/or use of iterative reconstruction technique. CONTRAST:  76mL OMNIPAQUE IOHEXOL 300 MG/ML  SOLN COMPARISON:  None. FINDINGS: Urinary Tract:  No abnormality visualized. Bowel:  Unremarkable visualized pelvic bowel loops. Vascular/Lymphatic: Calcific aortic atherosclerosis. No lymphadenopathy. Other:  None.  Musculoskeletal: Hematoma in the right gluteal musculature measures 10.7 x 4.0 cm at its largest component. There is a more superior component that measures 4.6 x 3.2 cm. There is an internal hematocrit level. IMPRESSION: 1. Right gluteal intramuscular hematomas, measuring up to 10.7 x 4.0 cm. Aortic Atherosclerosis (ICD10-I70.0). Electronically Signed   By: Ulyses Jarred M.D.   On: 04/08/2021 22:57  ? ?DG HIP UNILAT WITH PELVIS 2-3 VIEWS RIGHT ? ?Result Date: 04/08/2021 ?CLINICAL DATA:  Right hip and low back pain. Acute difficulty with walking. EXAM: DG HIP (WITH OR WITHOUT PELVIS) 2-3V RIGHT COMPARISON:  Abdominal CT from 3 days prior FINDINGS: Asymmetric spurring at the right anterior ilium likely from remote avulsion fractures by prior CT. No acute fracture. No hip joint narrowing or spurring. Negative for erosion. Postoperative pelvis. IMPRESSION: No acute finding or degenerative hip narrowing. Electronically Signed   By: Jorje Guild M.D.   On: 04/08/2021 11:33  ? ?VAS [place] LOWER EXTREMITY VENOUS (DVT) ? ?Result Date: 04/08/2021 ? Lower Venous DVT Study Patient Name:  Wesley Harmon  Date of Exam:   04/08/2021 Medical Rec #: [id number]            Accession #:    [id number] Date of Birth: 07-Dec-1945            Patient Gender: M Patient Age:   60 years Exam Location:  [facility] Procedure:      VAS  [place] LOWER EXTREMITY VENOUS (DVT) Referring Phys: Zalika Tieszen --------------------------------------------------------------------------------  Indications: Swelling, and Pain in right hip.  Comparison Study: No prior

## 2021-04-16 ENCOUNTER — Inpatient Hospital Stay: Payer: Medicare Other

## 2021-06-08 ENCOUNTER — Inpatient Hospital Stay: Payer: Medicare Other

## 2021-06-08 ENCOUNTER — Other Ambulatory Visit: Payer: Medicare Other

## 2021-06-09 ENCOUNTER — Telehealth: Payer: Self-pay | Admitting: Internal Medicine

## 2021-06-09 ENCOUNTER — Other Ambulatory Visit: Payer: Self-pay

## 2021-06-09 ENCOUNTER — Ambulatory Visit (HOSPITAL_COMMUNITY)
Admission: RE | Admit: 2021-06-09 | Discharge: 2021-06-09 | Disposition: A | Discharge status: Home / Self Care | Payer: Medicare Other | Source: Ambulatory Visit | Attending: Internal Medicine | Admitting: Internal Medicine

## 2021-06-09 DIAGNOSIS — C3490 Malignant neoplasm of unspecified part of unspecified bronchus or lung: Secondary | ICD-10-CM | POA: Insufficient documentation

## 2021-06-09 NOTE — Telephone Encounter (Signed)
Called patient regarding upcoming appointment, patient is notified. °

## 2021-06-09 NOTE — Progress Notes (Signed)
Handwritten order for a portacath flush has been sent to [facility] at pts request to have his routine port flushes closer to his home.  PH: [phone] FX: [phone]  Confirmation was received.

## 2021-06-10 ENCOUNTER — Inpatient Hospital Stay: Payer: Medicare Other

## 2021-06-10 ENCOUNTER — Other Ambulatory Visit: Payer: Self-pay

## 2021-06-10 ENCOUNTER — Inpatient Hospital Stay: Payer: Medicare Other | Attending: Internal Medicine | Admitting: Internal Medicine

## 2021-06-10 VITALS — BP 120/72 | HR 65 | Temp 96.8°F | Resp 17 | Wt 223.1 lb

## 2021-06-10 DIAGNOSIS — I2699 Other pulmonary embolism without acute cor pulmonale: Secondary | ICD-10-CM | POA: Insufficient documentation

## 2021-06-10 DIAGNOSIS — S8011XA Contusion of right lower leg, initial encounter: Secondary | ICD-10-CM | POA: Diagnosis not present

## 2021-06-10 DIAGNOSIS — Z85118 Personal history of other malignant neoplasm of bronchus and lung: Secondary | ICD-10-CM | POA: Diagnosis present

## 2021-06-10 DIAGNOSIS — N281 Cyst of kidney, acquired: Secondary | ICD-10-CM | POA: Insufficient documentation

## 2021-06-10 DIAGNOSIS — I4891 Unspecified atrial fibrillation: Secondary | ICD-10-CM | POA: Diagnosis not present

## 2021-06-10 DIAGNOSIS — Z7901 Long term (current) use of anticoagulants: Secondary | ICD-10-CM | POA: Diagnosis not present

## 2021-06-10 DIAGNOSIS — Y842 Radiological procedure and radiotherapy as the cause of abnormal reaction of the patient, or of later complication, without mention of misadventure at the time of the procedure: Secondary | ICD-10-CM | POA: Insufficient documentation

## 2021-06-10 DIAGNOSIS — R5383 Other fatigue: Secondary | ICD-10-CM | POA: Diagnosis not present

## 2021-06-10 DIAGNOSIS — R319 Hematuria, unspecified: Secondary | ICD-10-CM | POA: Diagnosis not present

## 2021-06-10 DIAGNOSIS — Z79899 Other long term (current) drug therapy: Secondary | ICD-10-CM | POA: Diagnosis not present

## 2021-06-10 DIAGNOSIS — C3432 Malignant neoplasm of lower lobe, left bronchus or lung: Secondary | ICD-10-CM

## 2021-06-10 DIAGNOSIS — Z8551 Personal history of malignant neoplasm of bladder: Secondary | ICD-10-CM | POA: Diagnosis not present

## 2021-06-10 DIAGNOSIS — Z885 Allergy status to narcotic agent status: Secondary | ICD-10-CM | POA: Insufficient documentation

## 2021-06-10 DIAGNOSIS — Z95828 Presence of other vascular implants and grafts: Secondary | ICD-10-CM

## 2021-06-10 DIAGNOSIS — J700 Acute pulmonary manifestations due to radiation: Secondary | ICD-10-CM | POA: Insufficient documentation

## 2021-06-10 DIAGNOSIS — R911 Solitary pulmonary nodule: Secondary | ICD-10-CM | POA: Insufficient documentation

## 2021-06-10 DIAGNOSIS — R0609 Other forms of dyspnea: Secondary | ICD-10-CM | POA: Diagnosis not present

## 2021-06-10 DIAGNOSIS — C3490 Malignant neoplasm of unspecified part of unspecified bronchus or lung: Secondary | ICD-10-CM

## 2021-06-10 LAB — CMP (CANCER CENTER ONLY)
ALT: 11 U/L (ref 0–44)
AST: 11 U/L — ABNORMAL LOW (ref 15–41)
Albumin: 3.7 g/dL (ref 3.5–5.0)
Alkaline Phosphatase: 93 U/L (ref 38–126)
Anion gap: 4 — ABNORMAL LOW (ref 5–15)
BUN: 36 mg/dL — ABNORMAL HIGH (ref 8–23)
CO2: 27 mmol/L (ref 22–32)
Calcium: 9 mg/dL (ref 8.9–10.3)
Chloride: 109 mmol/L (ref 98–111)
Creatinine: 1.28 mg/dL — ABNORMAL HIGH (ref 0.61–1.24)
GFR, Estimated: 58 mL/min — ABNORMAL LOW (ref >60)
Glucose, Bld: 108 mg/dL — ABNORMAL HIGH (ref 70–99)
Potassium: 4.6 mmol/L (ref 3.5–5.1)
Sodium: 140 mmol/L (ref 135–145)
Total Bilirubin: 0.3 mg/dL (ref 0.3–1.2)
Total Protein: 7 g/dL (ref 6.5–8.1)

## 2021-06-10 LAB — CBC WITH DIFFERENTIAL (CANCER CENTER ONLY)
Abs Immature Granulocytes: 0.03 10*3/uL (ref 0.00–0.07)
Basophils Absolute: 0 10*3/uL (ref 0.0–0.1)
Basophils Relative: 1 %
Eosinophils Absolute: 0.2 10*3/uL (ref 0.0–0.5)
Eosinophils Relative: 3 %
HCT: 32.5 % — ABNORMAL LOW (ref 39.0–52.0)
Hemoglobin: 10.9 g/dL — ABNORMAL LOW (ref 13.0–17.0)
Immature Granulocytes: 0 %
Lymphocytes Relative: 18 %
Lymphs Abs: 1.2 10*3/uL (ref 0.7–4.0)
MCH: 32.7 pg (ref 26.0–34.0)
MCHC: 33.5 g/dL (ref 30.0–36.0)
MCV: 97.6 fL (ref 80.0–100.0)
Monocytes Absolute: 0.5 10*3/uL (ref 0.1–1.0)
Monocytes Relative: 7 %
Neutro Abs: 5 10*3/uL (ref 1.7–7.7)
Neutrophils Relative %: 71 %
Platelet Count: 168 10*3/uL (ref 150–400)
RBC: 3.33 MIL/uL — ABNORMAL LOW (ref 4.22–5.81)
RDW: 14.2 % (ref 11.5–15.5)
WBC Count: 7 10*3/uL (ref 4.0–10.5)
nRBC: 0 % (ref 0.0–0.2)

## 2021-06-10 MED ORDER — HEPARIN SOD (PORK) LOCK FLUSH 100 UNIT/ML IV SOLN
500.0000 [IU] | Freq: Once | INTRAVENOUS | Status: AC
  Administered 2021-06-10: 500 [IU]

## 2021-06-10 MED ORDER — SODIUM CHLORIDE 0.9% FLUSH
10.0000 mL | Freq: Once | INTRAVENOUS | Status: AC
  Administered 2021-06-10: 10 mL

## 2021-06-10 NOTE — Progress Notes (Signed)
[place] Telephone:([phone]   Fax:([phone]) [phone]  OFFICE PROGRESS NOTE  [name], [place] [address]  PRINCIPAL DIAGNOSIS:  1) new right lower lobe pulmonary nodule suspicious for metachronous lung cancer. 2) Recurrent non-small cell lung cancer initially diagnosed as stage IIIA September 2006.  2) diagnosis of early stage bladder cancer in 2014: Status post resection followed by 6 months of intravesical BCG. 3) He has recurrent hematuria. Repeat cystoscopy and biopsy 01/16/2013: CIS, normal RPGs. CT scan abdomen: bilateral renal cysts. No adenopathy. Cystoscopy and biopsy December 2015: Positive cytologies 4) recurrent non-small cell lung cancer, adenocarcinoma involving the left lower lobe diagnosed in November 2015.  PRIOR THERAPY:  Status post 3 cycles of neoadjuvant chemotherapy with carboplatin and docetaxel, last dose was given November 22, 2004. Status post left upper lobectomy with lymph node dissection under the care of Dr. Arlyce Dice on January 11, 2005. Status post pericardial window on February 02, 2005 for evacuation of postoperative pericardial tamponade and the fluid was negative for malignancy. Status post 3 cycles of adjuvant chemotherapy with carboplatin and gemcitabine. Last dose was given May 13, 2005. Status post 5 cycles of systemic chemotherapy with carboplatin, paclitaxel and Avastin for disease recurrence. Last dose was given January 04, 2006 and the patient had stable disease by the end of the last cycle. Status post maintenance treatment with Avastin 15 mg/kg given every 3 weeks. The patient is status post 42 cycles, discontinued on July 02, 2008 after the patient had stable disease for more than 2 years. Status post Bilateral selective ureteral cytology, Bilateral retrograde pyelography, Transurethral resection of bladder tumor, Random bladder biopsies, Prostatic urethral biopsy and Transrectal  biopsy of the prostate under the care of Dr. Rutherford Limerick at [facility]. Status post redo thoracotomy with left pneumonectomy under the care of Dr. Lianne Moris at [facility] on 03/05/2014. Status post right nephroureterectomy at [facility] on July 13, 2020  CURRENT THERAPY: Observation.  INTERVAL HISTORY: Wesley Harmon 76 y.o. male returns to the clinic today for follow-up visit accompanied by his daughter Brayton Layman.  The patient is feeling fine today with no concerning complaints except for the baseline fatigue and shortness of breath at baseline increased with exertion.  He is currently on home oxygen.  He had a large hematoma on the right leg that was evacuated.  He is not currently on any anticoagulation.  He was seen by cardiology for his atrial fibrillation and they advised against anticoagulation because of his bleeding issues.  He denied having any weight loss or night sweats.  He has no nausea, vomiting, diarrhea or constipation.  He had repeat CT scan of the chest, abdomen and pelvis performed yesterday and he is here for evaluation and discussion of his scan results.  MEDICAL HISTORY: Past Medical History:  Diagnosis Date   Bladder cancer ([place]) 01/02/13   GERD (gastroesophageal reflux disease)    Hyperlipemia    Hypertension    lung ca dx'd 07/2004   chemo comp 06/2008   Lung cancer ([place])    Neuropathy    Presence of urostomy ([facility])     ALLERGIES:  is allergic to dilaudid [hydromorphone hcl] and gemcitabine.  MEDICATIONS:  Current Outpatient Medications  Medication Sig Dispense Refill   Albuterol Sulfate 108 (90 Base) MCG/ACT AEPB Inhale 2 puffs into the lungs every 6 (six) hours as needed. (Patient not taking: Reported on 04/06/2021)     Ascorbic Acid (VITAMIN  C) 1000 MG tablet Take 1,000 mg by mouth every morning.     aspirin EC 81 MG tablet Take 81 mg by mouth every morning.     atorvastatin (LIPITOR) 40 MG tablet Take 40 mg by mouth at bedtime.      Cholecalciferol (VITAMIN D) 50 MCG (2000 UT) tablet Take 2,000 Units by mouth every morning.     enoxaparin (LOVENOX) 100 MG/ML injection SMARTSIG:100 SUB-Q Daily     Ferrous Sulfate (IRON) 325 (65 Fe) MG TABS Take 1 tablet by mouth every other day.     gabapentin (NEURONTIN) 300 MG capsule Take 300 mg by mouth 2 (two) times daily.     iron polysaccharides (NIFEREX) 150 MG capsule Take 150 mg by mouth at bedtime.     levothyroxine (SYNTHROID) 50 MCG tablet Take 50 mcg by mouth daily before breakfast.     metoprolol tartrate (LOPRESSOR) 25 MG tablet Take 1 tablet (25 mg total) by mouth 2 (two) times daily. 180 tablet 1   morphine (MSIR) 15 MG tablet Take 0.5 tablets (7.5 mg total) by mouth every 4 (four) hours as needed for severe pain. 3 tablet 0   Multiple Vitamins-Minerals (MULTIVITAMIN WITH MINERALS) tablet Take 1 tablet by mouth every morning.     pantoprazole (PROTONIX) 40 MG tablet Take 1 tablet (40 mg total) by mouth daily. 30 tablet 0   polyethylene glycol (MIRALAX / GLYCOLAX) packet Take 17 g by mouth daily as needed (constipation).     primidone (MYSOLINE) 50 MG tablet Take 150 mg by mouth at bedtime.     No current facility-administered medications for this visit.    REVIEW OF SYSTEMS:  A comprehensive review of systems was negative except for: Constitutional: positive for fatigue Respiratory: positive for dyspnea on exertion   PHYSICAL EXAMINATION: General appearance: alert, cooperative, fatigued, and no distress Head: Normocephalic, without obvious abnormality, atraumatic Neck: no adenopathy Lymph nodes: Cervical, supraclavicular, and axillary nodes normal. Resp: Clear to auscultation on the right and absent breath sound on the left Back: symmetric, no curvature. ROM normal. No CVA tenderness. Cardio: regular rate and rhythm, S1, S2 normal, no murmur, click, rub or gallop GI: soft, non-tender; bowel sounds normal; no masses,  no organomegaly Extremities: extremities normal,  atraumatic, no cyanosis or edema  ECOG PERFORMANCE STATUS: 2 - Symptomatic, <50% confined to bed  Blood pressure 120/72, pulse 65, temperature (!) 96.8 F (36 C), temperature source Tympanic, resp. rate 17, weight 223 lb 1.6 oz (101.2 kg), SpO2 100 %.  LABORATORY DATA: Lab Results  Component Value Date   WBC 7.0 06/10/2021   HGB 10.9 (L) 06/10/2021   HCT 32.5 (L) 06/10/2021   MCV 97.6 06/10/2021   PLT 168 06/10/2021      Chemistry      Component Value Date/Time   NA 137 04/08/2021 2056   NA 138 08/24/2016 1501   K 5.4 (H) 04/08/2021 2056   K 4.0 08/24/2016 1501   CL 107 04/08/2021 2056   CL 102 09/30/2011 0843   CO2 24 04/08/2021 2056   CO2 26 08/24/2016 1501   BUN 34 (H) 04/08/2021 2056   BUN 23.0 08/24/2016 1501   CREATININE 2.11 (H) 04/08/2021 2056   CREATININE 1.63 (H) 12/09/2020 1451   CREATININE 1.1 08/24/2016 1501      Component Value Date/Time   CALCIUM 8.7 (L) 04/08/2021 2056   CALCIUM 9.3 08/24/2016 1501   ALKPHOS 98 12/09/2020 1451   ALKPHOS 111 08/24/2016 1501   AST 14 (L)  12/09/2020 1451   AST 19 08/24/2016 1501   ALT 10 12/09/2020 1451   ALT 16 08/24/2016 1501   BILITOT <0.2 (L) 12/09/2020 1451   BILITOT 0.72 08/24/2016 1501       RADIOGRAPHIC STUDIES: No results found. CT scan of the chest, abdomen pelvis performed on March 26, 2021 showed Impression:  1.  No evidence of recurrent or metastatic disease in the abdomen or  pelvis.  2.  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  3.  Slightly increasing strandy and consolidative opacities in the superior  right lower lobe, which may be infectious/inflammatory. Underlying neoplasm  is not excluded. Continued attention on follow-up exams.  4.  Similar scattered pulmonary nodules in the right lung. Continued  attention on follow-up exams.   Findings #2 were discussed by telephone with Provider Barthel, NP by Dr.  Dorthy Cooler at  03/26/2021 12:21 AM.   Electronically Reviewed by:  Dorthy Cooler, MD, [facility]  Electronically Reviewed on:  03/26/2021 12:23 PM   I have reviewed the images and concur with the above findings.   Electronically Signed by:  Luvenia Redden, MD, [facility]  Electronically Signed on:  03/26/2021 5:22 PM  ASSESSMENT AND PLAN: This is a very pleasant 76 years old white male with: 1)  recurrent non-small cell lung cancer status post several treatment regimen including neoadjuvant chemotherapy followed by left upper lobectomy followed by systemic chemotherapy as well as maintenance treatment with Avastin and has been observation since June of 2010 and with no evidence for disease recurrence until November 2015 when he was found to have hypermetabolic activity in the left lower lobe.. The patient underwent redo thoracotomy with complete left pneumonectomy His imaging studies including CT scan of the chest as well as a PET scan in August 2021 showed a suspicious hypermetabolic nodule in the right lung.  The patient underwent CT-guided biopsy of this lesion but it was negative for malignancy.  He had repeat CT scan of the chest performed recently.  I personally and independently reviewed the scan images and discussed the results with the patient today. Unfortunately scan showed further increase in the size of the irregular solid 2.0 cm superior right lower lobe pulmonary nodule suspicious for metachronous primary bronchogenic carcinoma versus metastasis. This is highly suspicious for malignancy even with the negative previous biopsy. He underwent SBRT to this lesion under the care of Dr. Lisbeth Renshaw.   The patient is currently on observation and he is feeling fine with no concerning complaints. He had repeat CT scan of the chest, abdomen and pelvis performed yesterday.  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 I recommended for the patient to continue on observation and he will have repeat imaging studies at [facility] in few months. I will see him back for follow-up visit in 6 months for evaluation and repeat blood work. 2) history of bladder cancer: Status post resection at [facility].he is currently followed by Dr.Tsivinn at [facility].  The recent biopsy confirmed a high-grade urothelial carcinoma.  He underwent right nephroureterectomy at [facility] on July 12, 2020 followed by urology at that facility.  I recommended for the patient to continue on observation and follow  with his urologist at [facility].  3) for the recently diagnosed pulmonary embolism and atrial fibrillation, he is followed by cardiology and they recommended against anticoagulation because of his bleeding issues and large hematoma in the right lower extremity. The patient was advised to call immediately if he has any concerning symptoms in the interval. The patient voices understanding of current disease status and treatment options and is in agreement with the current care plan.  All questions were answered. The patient knows to call the clinic with any problems, questions or concerns. We can certainly see the patient much sooner if necessary.  Disclaimer: This note was dictated with voice recognition software. Similar sounding words can inadvertently be transcribed and may be missed upon review.

## 2021-08-26 ENCOUNTER — Telehealth: Payer: Self-pay

## 2021-08-26 NOTE — Telephone Encounter (Signed)
Pt called stating he was advised yesterday that he was positive for DVT at [facility] and was referred to Hematology and since he see's Dr. Julien Nordmann for lung cancer, he would like to see him.  [facility] History documentation: "Deep vein thrombosis (DVT) of femoral vein of left lower extremity, unspecified chronicity (CMS-HCC) I82.412 Ambulatory Referral to Hematology-Benign"  [facility] documentation from 08/25/21: "-New left external iliac/femoral vein thrombus, patient was recently transitioned to apixaban. -Discussed consideration of transition to Lovenox which the patient has. Referral placed for hematology appointment".

## 2021-08-27 NOTE — Telephone Encounter (Signed)
I spoke with Wesley Harmon and advised as indicated. Wesley Harmon and his wife has several questions and would like to see Dr. Julien Nordmann for a visit.   I have scheduled the Wesley Harmon for Monday 08/30/21. Wesley Harmon expressed understanding of this information.

## 2021-08-30 ENCOUNTER — Inpatient Hospital Stay: Payer: Medicare Other | Attending: Internal Medicine | Admitting: Internal Medicine

## 2021-08-30 ENCOUNTER — Encounter: Payer: Self-pay | Admitting: Internal Medicine

## 2021-08-30 ENCOUNTER — Inpatient Hospital Stay: Payer: Medicare Other

## 2021-08-30 ENCOUNTER — Other Ambulatory Visit: Payer: Self-pay

## 2021-08-30 VITALS — BP 141/72 | HR 82 | Temp 97.8°F | Resp 18 | Wt 240.4 lb

## 2021-08-30 DIAGNOSIS — Z885 Allergy status to narcotic agent status: Secondary | ICD-10-CM | POA: Insufficient documentation

## 2021-08-30 DIAGNOSIS — Z79890 Hormone replacement therapy (postmenopausal): Secondary | ICD-10-CM | POA: Diagnosis not present

## 2021-08-30 DIAGNOSIS — R319 Hematuria, unspecified: Secondary | ICD-10-CM | POA: Insufficient documentation

## 2021-08-30 DIAGNOSIS — Z95828 Presence of other vascular implants and grafts: Secondary | ICD-10-CM

## 2021-08-30 DIAGNOSIS — C3432 Malignant neoplasm of lower lobe, left bronchus or lung: Secondary | ICD-10-CM

## 2021-08-30 DIAGNOSIS — R0609 Other forms of dyspnea: Secondary | ICD-10-CM | POA: Diagnosis not present

## 2021-08-30 DIAGNOSIS — R911 Solitary pulmonary nodule: Secondary | ICD-10-CM | POA: Insufficient documentation

## 2021-08-30 DIAGNOSIS — Z79899 Other long term (current) drug therapy: Secondary | ICD-10-CM | POA: Insufficient documentation

## 2021-08-30 DIAGNOSIS — I825Z2 Chronic embolism and thrombosis of unspecified deep veins of left distal lower extremity: Secondary | ICD-10-CM

## 2021-08-30 DIAGNOSIS — Z7901 Long term (current) use of anticoagulants: Secondary | ICD-10-CM | POA: Diagnosis not present

## 2021-08-30 DIAGNOSIS — I4891 Unspecified atrial fibrillation: Secondary | ICD-10-CM | POA: Diagnosis not present

## 2021-08-30 DIAGNOSIS — I10 Essential (primary) hypertension: Secondary | ICD-10-CM | POA: Insufficient documentation

## 2021-08-30 DIAGNOSIS — I2699 Other pulmonary embolism without acute cor pulmonale: Secondary | ICD-10-CM | POA: Insufficient documentation

## 2021-08-30 DIAGNOSIS — C678 Malignant neoplasm of overlapping sites of bladder: Secondary | ICD-10-CM | POA: Diagnosis not present

## 2021-08-30 DIAGNOSIS — N281 Cyst of kidney, acquired: Secondary | ICD-10-CM | POA: Diagnosis not present

## 2021-08-30 DIAGNOSIS — Z8551 Personal history of malignant neoplasm of bladder: Secondary | ICD-10-CM | POA: Insufficient documentation

## 2021-08-30 DIAGNOSIS — Z85118 Personal history of other malignant neoplasm of bronchus and lung: Secondary | ICD-10-CM | POA: Insufficient documentation

## 2021-08-30 DIAGNOSIS — I824Z2 Acute embolism and thrombosis of unspecified deep veins of left distal lower extremity: Secondary | ICD-10-CM | POA: Insufficient documentation

## 2021-08-30 DIAGNOSIS — R0602 Shortness of breath: Secondary | ICD-10-CM | POA: Insufficient documentation

## 2021-08-30 LAB — CBC WITH DIFFERENTIAL (CANCER CENTER ONLY)
Abs Immature Granulocytes: 0.04 10*3/uL (ref 0.00–0.07)
Basophils Absolute: 0.1 10*3/uL (ref 0.0–0.1)
Basophils Relative: 1 %
Eosinophils Absolute: 0.3 10*3/uL (ref 0.0–0.5)
Eosinophils Relative: 4 %
HCT: 35.2 % — ABNORMAL LOW (ref 39.0–52.0)
Hemoglobin: 11.9 g/dL — ABNORMAL LOW (ref 13.0–17.0)
Immature Granulocytes: 1 %
Lymphocytes Relative: 19 %
Lymphs Abs: 1.2 10*3/uL (ref 0.7–4.0)
MCH: 32.5 pg (ref 26.0–34.0)
MCHC: 33.8 g/dL (ref 30.0–36.0)
MCV: 96.2 fL (ref 80.0–100.0)
Monocytes Absolute: 0.5 10*3/uL (ref 0.1–1.0)
Monocytes Relative: 8 %
Neutro Abs: 4.3 10*3/uL (ref 1.7–7.7)
Neutrophils Relative %: 67 %
Platelet Count: 172 10*3/uL (ref 150–400)
RBC: 3.66 MIL/uL — ABNORMAL LOW (ref 4.22–5.81)
RDW: 13.2 % (ref 11.5–15.5)
WBC Count: 6.3 10*3/uL (ref 4.0–10.5)
nRBC: 0 % (ref 0.0–0.2)

## 2021-08-30 LAB — CMP (CANCER CENTER ONLY)
ALT: 9 U/L (ref 0–44)
AST: 13 U/L — ABNORMAL LOW (ref 15–41)
Albumin: 3.8 g/dL (ref 3.5–5.0)
Alkaline Phosphatase: 94 U/L (ref 38–126)
Anion gap: 3 — ABNORMAL LOW (ref 5–15)
BUN: 34 mg/dL — ABNORMAL HIGH (ref 8–23)
CO2: 27 mmol/L (ref 22–32)
Calcium: 9.1 mg/dL (ref 8.9–10.3)
Chloride: 109 mmol/L (ref 98–111)
Creatinine: 1.5 mg/dL — ABNORMAL HIGH (ref 0.61–1.24)
GFR, Estimated: 47 mL/min — ABNORMAL LOW (ref >60)
Glucose, Bld: 118 mg/dL — ABNORMAL HIGH (ref 70–99)
Potassium: 4.9 mmol/L (ref 3.5–5.1)
Sodium: 139 mmol/L (ref 135–145)
Total Bilirubin: 0.3 mg/dL (ref 0.3–1.2)
Total Protein: 6.8 g/dL (ref 6.5–8.1)

## 2021-08-30 MED ORDER — HEPARIN SOD (PORK) LOCK FLUSH 100 UNIT/ML IV SOLN
500.0000 [IU] | Freq: Once | INTRAVENOUS | Status: AC
  Administered 2021-08-30: 500 [IU]

## 2021-08-30 MED ORDER — SODIUM CHLORIDE 0.9% FLUSH
10.0000 mL | Freq: Once | INTRAVENOUS | Status: AC
  Administered 2021-08-30: 10 mL

## 2021-08-30 NOTE — Progress Notes (Signed)
[place] [phone]   Fax:(336) [phone]  OFFICE PROGRESS NOTE  [name], [place] [address]  PRINCIPAL DIAGNOSIS:  1) new right lower lobe pulmonary nodule suspicious for metachronous lung cancer. 2) Recurrent non-small cell lung cancer initially diagnosed as stage IIIA September 2006.  2) diagnosis of early stage bladder cancer in 2014: Status post resection followed by 6 months of intravesical BCG. 3) He has recurrent hematuria. Repeat cystoscopy and biopsy 01/16/2013: CIS, normal RPGs. CT scan abdomen: bilateral renal cysts. No adenopathy. Cystoscopy and biopsy December 2015: Positive cytologies 4) recurrent non-small cell lung cancer, adenocarcinoma involving the left lower lobe diagnosed in November 2015.  PRIOR THERAPY:  Status post 3 cycles of neoadjuvant chemotherapy with carboplatin and docetaxel, last dose was given November 22, 2004. Status post left upper lobectomy with lymph node dissection under the care of Dr. Arlyce Dice on January 11, 2005. Status post pericardial window on February 02, 2005 for evacuation of postoperative pericardial tamponade and the fluid was negative for malignancy. Status post 3 cycles of adjuvant chemotherapy with carboplatin and gemcitabine. Last dose was given May 13, 2005. Status post 5 cycles of systemic chemotherapy with carboplatin, paclitaxel and Avastin for disease recurrence. Last dose was given January 04, 2006 and the patient had stable disease by the end of the last cycle. Status post maintenance treatment with Avastin 15 mg/kg given every 3 weeks. The patient is status post 42 cycles, discontinued on July 02, 2008 after the patient had stable disease for more than 2 years. Status post Bilateral selective ureteral cytology, Bilateral retrograde pyelography, Transurethral resection of bladder tumor, Random bladder biopsies, Prostatic urethral biopsy and Transrectal  biopsy of the prostate under the care of Dr. Rutherford Limerick at [facility]. Status post redo thoracotomy with left pneumonectomy under the care of Dr. Lianne Moris at [facility] on 03/05/2014. Status post right nephroureterectomy at [facility] on July 13, 2020  CURRENT THERAPY: Eliquis 5 mg p.o. twice daily.  INTERVAL HISTORY: Wesley Harmon 76 y.o. male returns to the clinic today for follow-up visit accompanied by his wife.  The patient has no complaints today except for the baseline shortness of breath.  He was seen recently at [facility] by Dr. 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  The patient was not on any anticoagulation at that time.  For some reason he stopped his treatment with Eliquis 2 months before.  He denied having any chest pain but continues to have the baseline shortness of breath with no cough or hemoptysis.  He has no nausea, vomiting, diarrhea or constipation.  He has no headache or visual changes.  MEDICAL HISTORY: Past Medical History:  Diagnosis Date   Bladder cancer ([place]) 01/02/13   GERD (gastroesophageal reflux disease)    Hyperlipemia    Hypertension    lung ca dx'd 07/2004   chemo comp 06/2008   Lung cancer ([place])    Neuropathy    Presence of urostomy (HCC)     ALLERGIES:  is allergic to dilaudid [hydromorphone hcl] and gemcitabine.  MEDICATIONS:  Current Outpatient Medications  Medication Sig Dispense Refill   Albuterol Sulfate 108 (90 Base) MCG/ACT AEPB Inhale 2 puffs into the lungs every 6 (six) hours as needed. (Patient not taking: Reported on 04/06/2021)     Ascorbic Acid (VITAMIN C) 1000 MG tablet Take 1,000 mg  by mouth every morning.     aspirin EC 81 MG tablet Take 81 mg by mouth every morning.     atorvastatin (LIPITOR) 40 MG tablet Take 40 mg by mouth at bedtime.     Cholecalciferol (VITAMIN D) 50 MCG (2000 UT) tablet Take 2,000 Units  by mouth every morning.     enoxaparin (LOVENOX) 100 MG/ML injection SMARTSIG:100 SUB-Q Daily     gabapentin (NEURONTIN) 300 MG capsule Take 300 mg by mouth 2 (two) times daily.     iron polysaccharides (NIFEREX) 150 MG capsule Take 150 mg by mouth at bedtime.     levothyroxine (SYNTHROID) 50 MCG tablet Take 50 mcg by mouth daily before breakfast.     metoprolol tartrate (LOPRESSOR) 25 MG tablet Take 1 tablet (25 mg total) by mouth 2 (two) times daily. 180 tablet 1   morphine (MSIR) 15 MG tablet Take 0.5 tablets (7.5 mg total) by mouth every 4 (four) hours as needed for severe pain. 3 tablet 0   Multiple Vitamins-Minerals (MULTIVITAMIN WITH MINERALS) tablet Take 1 tablet by mouth every morning.     pantoprazole (PROTONIX) 40 MG tablet Take 1 tablet (40 mg total) by mouth daily. 30 tablet 0   Polyethylene Glycol 3350 (PEG 3350) 17 GM/SCOOP POWD Take 17 g by mouth daily.     primidone (MYSOLINE) 50 MG tablet Take 150 mg by mouth at bedtime.     No current facility-administered medications for this visit.    REVIEW OF SYSTEMS:  A comprehensive review of systems was negative except for: Respiratory: positive for dyspnea on exertion   PHYSICAL EXAMINATION: General appearance: alert, cooperative, fatigued, and no distress Head: Normocephalic, without obvious abnormality, atraumatic Neck: no adenopathy Lymph nodes: Cervical, supraclavicular, and axillary nodes normal. Resp: Clear to auscultation on the right and absent breath sound on the left Back: symmetric, no curvature. ROM normal. No CVA tenderness. Cardio: regular rate and rhythm, S1, S2 normal, no murmur, click, rub or gallop GI: soft, non-tender; bowel sounds normal; no masses,  no organomegaly Extremities: extremities normal, atraumatic, no cyanosis or edema  ECOG PERFORMANCE STATUS: 2 - Symptomatic, <50% confined to bed  Blood pressure (!) 141/72, pulse 82, temperature 97.8 F (36.6 C), temperature source Oral, resp. rate 18, weight  240 lb 6.4 oz (109 kg), SpO2 92 %.  LABORATORY DATA: Lab Results  Component Value Date   WBC 6.3 08/30/2021   HGB 11.9 (L) 08/30/2021   HCT 35.2 (L) 08/30/2021   MCV 96.2 08/30/2021   PLT 172 08/30/2021      Chemistry      Component Value Date/Time   NA 139 08/30/2021 0847   NA 138 08/24/2016 1501   K 4.9 08/30/2021 0847   K 4.0 08/24/2016 1501   CL 109 08/30/2021 0847   CL 102 09/30/2011 0843   CO2 27 08/30/2021 0847   CO2 26 08/24/2016 1501   BUN 34 (H) 08/30/2021 0847   BUN 23.0 08/24/2016 1501   CREATININE 1.50 (H) 08/30/2021 0847   CREATININE 1.1 08/24/2016 1501      Component Value Date/Time   CALCIUM 9.1 08/30/2021 0847   CALCIUM 9.3 08/24/2016 1501   ALKPHOS 94 08/30/2021 0847   ALKPHOS 111 08/24/2016 1501   AST 13 (L) 08/30/2021 0847   AST 19 08/24/2016 1501   ALT 9 08/30/2021 0847   ALT 16 08/24/2016 1501   BILITOT 0.3 08/30/2021 0847   BILITOT 0.72 08/24/2016 1501       RADIOGRAPHIC STUDIES: No  results found.  ASSESSMENT AND PLAN: This is a very pleasant 76 years old white male with:  1)  recurrent non-small cell lung cancer status post several treatment regimen including neoadjuvant chemotherapy followed by left upper lobectomy followed by systemic chemotherapy as well as maintenance treatment with Avastin and has been observation since June of 2010 and with no evidence for disease recurrence until November 2015 when he was found to have hypermetabolic activity in the left lower lobe.. The patient underwent redo thoracotomy with complete left pneumonectomy His imaging studies including CT scan of the chest as well as a PET scan in August 2021 showed a suspicious hypermetabolic nodule in the right lung.  The patient underwent CT-guided biopsy of this lesion but it was negative for malignancy.  He had repeat CT scan of the chest performed recently.  I personally and independently reviewed the scan images and discussed the results with the patient  today. Unfortunately scan showed further increase in the size of the irregular solid 2.0 cm superior right lower lobe pulmonary nodule suspicious for metachronous primary bronchogenic carcinoma versus metastasis. This is highly suspicious for malignancy even with the negative previous biopsy. He underwent SBRT to this lesion under the care of Dr. Lisbeth Renshaw.   He has been in observation and feeling fine with no concerning complaints.  2) history of bladder cancer: Status post resection at [facility].he is currently followed by Dr.Tsivinn at [facility].  The recent biopsy confirmed a high-grade urothelial carcinoma.  He underwent right nephroureterectomy at [facility] on July 12, 2020 followed by urology at that facility.  I recommended for the patient to continue on observation and follow with his urologist at [facility].  3) for the recently diagnosed pulmonary embolism and atrial fibrillation, the patient developed new left external iliac/femoral vein thrombus during his visit at [facility].  He was of anticoagulation at that time.  The initial plan was to place the patient on Lovenox but he is worried about the frequent injection and bruising.  I recommended for him to resume his treatment with Eliquis 5 mg p.o. twice daily.  He developed a clot when he was off Eliquis for 2 months. I will continue to monitor him closely. He will come back for follow-up visit as previously scheduled in November 2023.  The patient voices understanding of current disease status and treatment options and is in agreement with the current care plan.  All questions were answered. The patient knows to call the clinic with any problems, questions or concerns. We can certainly see the patient much sooner if necessary.  Disclaimer: This note was dictated with voice recognition software. Similar sounding words can inadvertently be transcribed and may be missed upon review.

## 2021-08-31 ENCOUNTER — Telehealth: Payer: Self-pay | Admitting: Internal Medicine

## 2021-08-31 NOTE — Telephone Encounter (Signed)
Scheduled per 08/14 los, patient has been called and notified.

## 2021-10-05 ENCOUNTER — Ambulatory Visit: Payer: Medicare Other | Admitting: Internal Medicine

## 2021-10-05 ENCOUNTER — Other Ambulatory Visit: Payer: Medicare Other

## 2021-11-05 ENCOUNTER — Other Ambulatory Visit: Payer: Self-pay

## 2021-11-05 ENCOUNTER — Inpatient Hospital Stay: Payer: Medicare Other | Attending: Internal Medicine

## 2021-11-05 DIAGNOSIS — C3432 Malignant neoplasm of lower lobe, left bronchus or lung: Secondary | ICD-10-CM | POA: Insufficient documentation

## 2021-11-05 DIAGNOSIS — Z452 Encounter for adjustment and management of vascular access device: Secondary | ICD-10-CM | POA: Insufficient documentation

## 2021-11-30 ENCOUNTER — Inpatient Hospital Stay: Payer: Medicare Other | Admitting: Internal Medicine

## 2021-11-30 ENCOUNTER — Inpatient Hospital Stay: Payer: Medicare Other | Attending: Internal Medicine

## 2022-01-19 ENCOUNTER — Inpatient Hospital Stay: Payer: Medicare Other | Attending: Internal Medicine | Admitting: Internal Medicine

## 2022-01-19 ENCOUNTER — Inpatient Hospital Stay: Payer: Medicare Other

## 2022-01-19 ENCOUNTER — Other Ambulatory Visit: Payer: Self-pay

## 2022-01-19 VITALS — BP 124/87 | HR 83 | Temp 97.7°F | Resp 16 | Wt 238.0 lb

## 2022-01-19 DIAGNOSIS — N281 Cyst of kidney, acquired: Secondary | ICD-10-CM | POA: Diagnosis not present

## 2022-01-19 DIAGNOSIS — Z95828 Presence of other vascular implants and grafts: Secondary | ICD-10-CM

## 2022-01-19 DIAGNOSIS — I2699 Other pulmonary embolism without acute cor pulmonale: Secondary | ICD-10-CM | POA: Diagnosis not present

## 2022-01-19 DIAGNOSIS — Z7901 Long term (current) use of anticoagulants: Secondary | ICD-10-CM | POA: Diagnosis not present

## 2022-01-19 DIAGNOSIS — C3432 Malignant neoplasm of lower lobe, left bronchus or lung: Secondary | ICD-10-CM

## 2022-01-19 DIAGNOSIS — R911 Solitary pulmonary nodule: Secondary | ICD-10-CM | POA: Diagnosis present

## 2022-01-19 DIAGNOSIS — Z885 Allergy status to narcotic agent status: Secondary | ICD-10-CM | POA: Diagnosis not present

## 2022-01-19 DIAGNOSIS — C3490 Malignant neoplasm of unspecified part of unspecified bronchus or lung: Secondary | ICD-10-CM

## 2022-01-19 DIAGNOSIS — R5383 Other fatigue: Secondary | ICD-10-CM | POA: Diagnosis not present

## 2022-01-19 DIAGNOSIS — I4891 Unspecified atrial fibrillation: Secondary | ICD-10-CM | POA: Insufficient documentation

## 2022-01-19 DIAGNOSIS — R0609 Other forms of dyspnea: Secondary | ICD-10-CM | POA: Insufficient documentation

## 2022-01-19 DIAGNOSIS — Z85118 Personal history of other malignant neoplasm of bronchus and lung: Secondary | ICD-10-CM | POA: Diagnosis present

## 2022-01-19 DIAGNOSIS — Z79899 Other long term (current) drug therapy: Secondary | ICD-10-CM | POA: Insufficient documentation

## 2022-01-19 DIAGNOSIS — Z8551 Personal history of malignant neoplasm of bladder: Secondary | ICD-10-CM | POA: Diagnosis not present

## 2022-01-19 LAB — CBC WITH DIFFERENTIAL (CANCER CENTER ONLY)
Abs Immature Granulocytes: 0.01 10*3/uL (ref 0.00–0.07)
Basophils Absolute: 0.1 10*3/uL (ref 0.0–0.1)
Basophils Relative: 1 %
Eosinophils Absolute: 0.1 10*3/uL (ref 0.0–0.5)
Eosinophils Relative: 2 %
HCT: 39.1 % (ref 39.0–52.0)
Hemoglobin: 13.5 g/dL (ref 13.0–17.0)
Immature Granulocytes: 0 %
Lymphocytes Relative: 20 %
Lymphs Abs: 1.3 10*3/uL (ref 0.7–4.0)
MCH: 32.8 pg (ref 26.0–34.0)
MCHC: 34.5 g/dL (ref 30.0–36.0)
MCV: 94.9 fL (ref 80.0–100.0)
Monocytes Absolute: 0.4 10*3/uL (ref 0.1–1.0)
Monocytes Relative: 6 %
Neutro Abs: 4.6 10*3/uL (ref 1.7–7.7)
Neutrophils Relative %: 71 %
Platelet Count: 173 10*3/uL (ref 150–400)
RBC: 4.12 MIL/uL — ABNORMAL LOW (ref 4.22–5.81)
RDW: 13.4 % (ref 11.5–15.5)
WBC Count: 6.5 10*3/uL (ref 4.0–10.5)
nRBC: 0 % (ref 0.0–0.2)

## 2022-01-19 LAB — CMP (CANCER CENTER ONLY)
ALT: 9 U/L (ref 0–44)
AST: 13 U/L — ABNORMAL LOW (ref 15–41)
Albumin: 3.9 g/dL (ref 3.5–5.0)
Alkaline Phosphatase: 102 U/L (ref 38–126)
Anion gap: 5 (ref 5–15)
BUN: 19 mg/dL (ref 8–23)
CO2: 28 mmol/L (ref 22–32)
Calcium: 9 mg/dL (ref 8.9–10.3)
Chloride: 106 mmol/L (ref 98–111)
Creatinine: 1.47 mg/dL — ABNORMAL HIGH (ref 0.61–1.24)
GFR, Estimated: 49 mL/min — ABNORMAL LOW (ref >60)
Glucose, Bld: 110 mg/dL — ABNORMAL HIGH (ref 70–99)
Potassium: 4.2 mmol/L (ref 3.5–5.1)
Sodium: 139 mmol/L (ref 135–145)
Total Bilirubin: 0.4 mg/dL (ref 0.3–1.2)
Total Protein: 6.6 g/dL (ref 6.5–8.1)

## 2022-01-19 MED ORDER — SODIUM CHLORIDE 0.9% FLUSH
10.0000 mL | Freq: Once | INTRAVENOUS | Status: AC
  Administered 2022-01-19: 10 mL

## 2022-01-19 MED ORDER — HEPARIN SOD (PORK) LOCK FLUSH 100 UNIT/ML IV SOLN
500.0000 [IU] | Freq: Once | INTRAVENOUS | Status: AC
  Administered 2022-01-19: 500 [IU]

## 2022-01-19 NOTE — Progress Notes (Signed)
[place] Telephone:([phone]   Fax:(336) [phone]  OFFICE PROGRESS NOTE  [name], MD [phone] [address]  PRINCIPAL DIAGNOSIS:  1) new right lower lobe pulmonary nodule suspicious for metachronous lung cancer. 2) Recurrent non-small cell lung cancer initially diagnosed as stage IIIA September 2006.  2) diagnosis of early stage bladder cancer in 2014: Status post resection followed by 6 months of intravesical BCG. 3) He has recurrent hematuria. Repeat cystoscopy and biopsy 01/16/2013: CIS, normal RPGs. CT scan abdomen: bilateral renal cysts. No adenopathy. Cystoscopy and biopsy December 2015: Positive cytologies 4) recurrent non-small cell lung cancer, adenocarcinoma involving the left lower lobe diagnosed in November 2015.  PRIOR THERAPY:  Status post 3 cycles of neoadjuvant chemotherapy with carboplatin and docetaxel, last dose was given November 22, 2004. Status post left upper lobectomy with lymph node dissection under the care of Dr. Arlyce Dice on January 11, 2005. Status post pericardial window on February 02, 2005 for evacuation of postoperative pericardial tamponade and the fluid was negative for malignancy. Status post 3 cycles of adjuvant chemotherapy with carboplatin and gemcitabine. Last dose was given May 13, 2005. Status post 5 cycles of systemic chemotherapy with carboplatin, paclitaxel and Avastin for disease recurrence. Last dose was given January 04, 2006 and the patient had stable disease by the end of the last cycle. Status post maintenance treatment with Avastin 15 mg/kg given every 3 weeks. The patient is status post 42 cycles, discontinued on July 02, 2008 after the patient had stable disease for more than 2 years. Status post Bilateral selective ureteral cytology, Bilateral retrograde pyelography, Transurethral resection of bladder tumor, Random bladder biopsies, Prostatic urethral biopsy and Transrectal biopsy of  the prostate under the care of Dr. Rutherford Limerick at [facility]. Status post redo thoracotomy with left pneumonectomy under the care of Dr. Lianne Moris at [facility] on 03/05/2014. Status post right nephroureterectomy at [facility] on July 13, 2020  CURRENT THERAPY: Eliquis 5 mg p.o. twice daily.  INTERVAL HISTORY: Wesley Harmon 77 y.o. male returns to the clinic today for follow-up visit.  The patient is feeling fine today with no concerning complaints except for mild fatigue.  He is currently on Eliquis 5 mg p.o. twice daily for the history of pulmonary embolism and atrial fibrillation and he is tolerating it fairly well with no concerning bleeding, bruises or ecchymosis.  He denied having any current chest pain, shortness breath, cough or hemoptysis.  He denied having any fever or chills.  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  He is here today for evaluation and repeat blood work.   MEDICAL HISTORY: Past Medical History:  Diagnosis Date   Bladder cancer ([place]) 01/02/13   GERD (gastroesophageal reflux disease)    Hyperlipemia    Hypertension    lung ca dx'd 07/2004   chemo comp 06/2008   Lung cancer ([place])    Neuropathy    Presence of urostomy (HCC)     ALLERGIES:  is allergic to dilaudid [hydromorphone hcl] and gemcitabine.  MEDICATIONS:  Current Outpatient Medications  Medication Sig Dispense Refill   Albuterol Sulfate 108 (90 Base) MCG/ACT AEPB Inhale 2 puffs into the lungs every 6 (six) hours as needed. (Patient not taking: Reported on 04/06/2021)     Ascorbic Acid (VITAMIN C) 1000 MG tablet Take 1,000 mg by mouth every morning.     aspirin EC 81 MG  tablet Take 81 mg by mouth every morning.     atorvastatin (LIPITOR) 40 MG tablet Take 40 mg by mouth at bedtime.     Cholecalciferol (VITAMIN D) 50 MCG (2000 UT) tablet Take 2,000 Units by mouth every morning.     enoxaparin (LOVENOX) 100 MG/ML injection  SMARTSIG:100 SUB-Q Daily (Patient not taking: Reported on 08/30/2021)     gabapentin (NEURONTIN) 300 MG capsule Take 300 mg by mouth 2 (two) times daily.     levothyroxine (SYNTHROID) 50 MCG tablet Take 50 mcg by mouth daily before breakfast.     Multiple Vitamins-Minerals (MULTIVITAMIN WITH MINERALS) tablet Take 1 tablet by mouth every morning.     pantoprazole (PROTONIX) 40 MG tablet Take 1 tablet (40 mg total) by mouth daily. 30 tablet 0   Polyethylene Glycol 3350 (PEG 3350) 17 GM/SCOOP POWD Take 17 g by mouth daily.     primidone (MYSOLINE) 50 MG tablet Take 150 mg by mouth at bedtime.     No current facility-administered medications for this visit.    REVIEW OF SYSTEMS:  A comprehensive review of systems was negative except for: Constitutional: positive for fatigue Respiratory: positive for dyspnea on exertion   PHYSICAL EXAMINATION: General appearance: alert, cooperative, fatigued, and no distress Head: Normocephalic, without obvious abnormality, atraumatic Neck: no adenopathy Lymph nodes: Cervical, supraclavicular, and axillary nodes normal. Resp: Clear to auscultation on the right and absent breath sound on the left Back: symmetric, no curvature. ROM normal. No CVA tenderness. Cardio: regular rate and rhythm, S1, S2 normal, no murmur, click, rub or gallop GI: soft, non-tender; bowel sounds normal; no masses,  no organomegaly Extremities: extremities normal, atraumatic, no cyanosis or edema  ECOG PERFORMANCE STATUS: 1 - Symptomatic but completely ambulatory  Blood pressure 124/87, pulse 83, temperature 97.7 F (36.5 C), temperature source Oral, resp. rate 16, weight 238 lb (108 kg), SpO2 95 %.  LABORATORY DATA: Lab Results  Component Value Date   WBC 6.3 08/30/2021   HGB 11.9 (L) 08/30/2021   HCT 35.2 (L) 08/30/2021   MCV 96.2 08/30/2021   PLT 172 08/30/2021      Chemistry      Component Value Date/Time   NA 139 08/30/2021 0847   NA 138 08/24/2016 1501   K 4.9  08/30/2021 0847   K 4.0 08/24/2016 1501   CL 109 08/30/2021 0847   CL 102 09/30/2011 0843   CO2 27 08/30/2021 0847   CO2 26 08/24/2016 1501   BUN 34 (H) 08/30/2021 0847   BUN 23.0 08/24/2016 1501   CREATININE 1.50 (H) 08/30/2021 0847   CREATININE 1.1 08/24/2016 1501      Component Value Date/Time   CALCIUM 9.1 08/30/2021 0847   CALCIUM 9.3 08/24/2016 1501   ALKPHOS 94 08/30/2021 0847   ALKPHOS 111 08/24/2016 1501   AST 13 (L) 08/30/2021 0847   AST 19 08/24/2016 1501   ALT 9 08/30/2021 0847   ALT 16 08/24/2016 1501   BILITOT 0.3 08/30/2021 0847   BILITOT 0.72 08/24/2016 1501       RADIOGRAPHIC STUDIES: No results found.  ASSESSMENT AND PLAN: This is a very pleasant 77 years old white male with:  1)  recurrent non-small cell lung cancer status post several treatment regimen including neoadjuvant chemotherapy followed by left upper lobectomy followed by systemic chemotherapy as well as maintenance treatment with Avastin and has been observation since June of 2010 and with no evidence for disease recurrence until November 2015 when he was found to have  hypermetabolic activity in the left lower lobe.. The patient underwent redo thoracotomy with complete left pneumonectomy His imaging studies including CT scan of the chest as well as a PET scan in August 2021 showed a suspicious hypermetabolic nodule in the right lung.  The patient underwent CT-guided biopsy of this lesion but it was negative for malignancy.  Unfortunately further imaging studies scan showed increase in the size of the irregular solid 2.0 cm superior right lower lobe pulmonary nodule suspicious for metachronous primary bronchogenic carcinoma versus metastasis. This was highly suspicious for malignancy even with the negative previous biopsy. He underwent SBRT to this lesion under the care of Dr. Lisbeth Renshaw.   The patient is currently on observation and he is doing fine with no concerning complaints. I recommended for the  patient to continue on observation with repeat CT scan of the chest, abdomen and pelvis in around 4 months for restaging of his lung cancer and bladder cancer.   2) history of bladder cancer: Status post resection at [facility].he is currently followed by Dr.Tsivinn at [facility].  The recent biopsy confirmed a high-grade urothelial carcinoma.  He underwent right nephroureterectomy at [facility] on July 12, 2020 followed by urology at that facility.  He is currently on observation.  3) for the recently diagnosed pulmonary embolism and atrial fibrillation, the patient developed new left external iliac/femoral vein thrombus during his visit at [facility].  He was of anticoagulation at that time.  He is current on treatment with Eliquis and tolerating it fairly well. The patient will continue his current treatment with anticoagulation as planned. He was advised to call immediately if he has any other concerning symptoms in the interval.  The patient voices understanding of current disease status and treatment options and is in agreement with the current care plan.  All questions were answered. The patient knows to call the clinic with any problems, questions or concerns. We can certainly see the patient much sooner if necessary.  Disclaimer: This note was dictated with voice recognition software. Similar sounding words can inadvertently be transcribed and may be missed upon review.

## 2022-01-19 NOTE — Addendum Note (Signed)
Addended by: Ardeen Garland on: 01/19/2022 02:03 PM   Modules accepted: Orders

## 2022-02-10 ENCOUNTER — Telehealth: Payer: Self-pay | Admitting: Internal Medicine

## 2022-02-10 NOTE — Telephone Encounter (Signed)
Called patient regarding upcoming February appointments, patient does not have voicemail set up. Calendar will be mailed.

## 2022-03-08 ENCOUNTER — Telehealth: Payer: Self-pay | Admitting: Physician Assistant

## 2022-03-08 ENCOUNTER — Encounter: Payer: Self-pay | Admitting: Internal Medicine

## 2022-03-08 NOTE — Telephone Encounter (Signed)
Per 2/19 IB reached out and scheduled patient for [facility] flush, patient aware and confirmed time and date of appointment.

## 2022-03-09 ENCOUNTER — Inpatient Hospital Stay: Payer: Medicare Other | Attending: Internal Medicine

## 2022-03-09 DIAGNOSIS — Z85118 Personal history of other malignant neoplasm of bronchus and lung: Secondary | ICD-10-CM | POA: Insufficient documentation

## 2022-03-09 DIAGNOSIS — R911 Solitary pulmonary nodule: Secondary | ICD-10-CM | POA: Diagnosis present

## 2022-03-09 DIAGNOSIS — C3432 Malignant neoplasm of lower lobe, left bronchus or lung: Secondary | ICD-10-CM

## 2022-03-09 DIAGNOSIS — Z95828 Presence of other vascular implants and grafts: Secondary | ICD-10-CM

## 2022-03-09 MED ORDER — HEPARIN SOD (PORK) LOCK FLUSH 100 UNIT/ML IV SOLN
500.0000 [IU] | Freq: Once | INTRAVENOUS | Status: AC
  Administered 2022-03-09: 500 [IU]

## 2022-03-09 MED ORDER — SODIUM CHLORIDE 0.9% FLUSH
10.0000 mL | Freq: Once | INTRAVENOUS | Status: AC
  Administered 2022-03-09: 10 mL

## 2022-05-03 ENCOUNTER — Telehealth: Payer: Self-pay | Admitting: Internal Medicine

## 2022-05-05 ENCOUNTER — Inpatient Hospital Stay: Payer: Medicare Other | Attending: Internal Medicine

## 2022-05-05 ENCOUNTER — Other Ambulatory Visit: Payer: Self-pay

## 2022-05-05 DIAGNOSIS — Z95828 Presence of other vascular implants and grafts: Secondary | ICD-10-CM

## 2022-05-05 DIAGNOSIS — Z85118 Personal history of other malignant neoplasm of bronchus and lung: Secondary | ICD-10-CM | POA: Diagnosis present

## 2022-05-05 DIAGNOSIS — R911 Solitary pulmonary nodule: Secondary | ICD-10-CM | POA: Insufficient documentation

## 2022-05-05 DIAGNOSIS — C3432 Malignant neoplasm of lower lobe, left bronchus or lung: Secondary | ICD-10-CM

## 2022-05-05 MED ORDER — SODIUM CHLORIDE 0.9% FLUSH
10.0000 mL | INTRAVENOUS | Status: DC | PRN
  Administered 2022-05-05: 10 mL via INTRAVENOUS

## 2022-05-05 MED ORDER — HEPARIN SOD (PORK) LOCK FLUSH 100 UNIT/ML IV SOLN
500.0000 [IU] | Freq: Once | INTRAVENOUS | Status: AC | PRN
  Administered 2022-05-05: 500 [IU] via INTRAVENOUS

## 2022-05-18 ENCOUNTER — Telehealth: Payer: Self-pay | Admitting: Physician Assistant

## 2022-05-18 NOTE — Telephone Encounter (Signed)
Called patient regarding upcoming May appointments, patient is notified.  ?

## 2022-05-20 ENCOUNTER — Inpatient Hospital Stay: Payer: Medicare Other | Attending: Internal Medicine

## 2022-05-20 ENCOUNTER — Ambulatory Visit (HOSPITAL_COMMUNITY)
Admission: RE | Admit: 2022-05-20 | Discharge: 2022-05-20 | Disposition: A | Discharge status: Home / Self Care | Payer: Medicare Other | Source: Ambulatory Visit | Attending: Internal Medicine | Admitting: Internal Medicine

## 2022-05-20 ENCOUNTER — Other Ambulatory Visit: Payer: Self-pay

## 2022-05-20 DIAGNOSIS — Z885 Allergy status to narcotic agent status: Secondary | ICD-10-CM | POA: Diagnosis not present

## 2022-05-20 DIAGNOSIS — Z95828 Presence of other vascular implants and grafts: Secondary | ICD-10-CM

## 2022-05-20 DIAGNOSIS — Z79899 Other long term (current) drug therapy: Secondary | ICD-10-CM | POA: Insufficient documentation

## 2022-05-20 DIAGNOSIS — N62 Hypertrophy of breast: Secondary | ICD-10-CM | POA: Insufficient documentation

## 2022-05-20 DIAGNOSIS — C3432 Malignant neoplasm of lower lobe, left bronchus or lung: Secondary | ICD-10-CM

## 2022-05-20 DIAGNOSIS — I4891 Unspecified atrial fibrillation: Secondary | ICD-10-CM | POA: Diagnosis not present

## 2022-05-20 DIAGNOSIS — Z905 Acquired absence of kidney: Secondary | ICD-10-CM | POA: Diagnosis not present

## 2022-05-20 DIAGNOSIS — R911 Solitary pulmonary nodule: Secondary | ICD-10-CM | POA: Insufficient documentation

## 2022-05-20 DIAGNOSIS — N281 Cyst of kidney, acquired: Secondary | ICD-10-CM | POA: Diagnosis not present

## 2022-05-20 DIAGNOSIS — K435 Parastomal hernia without obstruction or  gangrene: Secondary | ICD-10-CM | POA: Diagnosis not present

## 2022-05-20 DIAGNOSIS — J432 Centrilobular emphysema: Secondary | ICD-10-CM | POA: Diagnosis not present

## 2022-05-20 DIAGNOSIS — I10 Essential (primary) hypertension: Secondary | ICD-10-CM | POA: Insufficient documentation

## 2022-05-20 DIAGNOSIS — Z9049 Acquired absence of other specified parts of digestive tract: Secondary | ICD-10-CM | POA: Diagnosis not present

## 2022-05-20 DIAGNOSIS — Z8551 Personal history of malignant neoplasm of bladder: Secondary | ICD-10-CM | POA: Diagnosis not present

## 2022-05-20 DIAGNOSIS — K4020 Bilateral inguinal hernia, without obstruction or gangrene, not specified as recurrent: Secondary | ICD-10-CM | POA: Insufficient documentation

## 2022-05-20 DIAGNOSIS — Z85118 Personal history of other malignant neoplasm of bronchus and lung: Secondary | ICD-10-CM | POA: Insufficient documentation

## 2022-05-20 DIAGNOSIS — K5900 Constipation, unspecified: Secondary | ICD-10-CM | POA: Insufficient documentation

## 2022-05-20 DIAGNOSIS — C3490 Malignant neoplasm of unspecified part of unspecified bronchus or lung: Secondary | ICD-10-CM | POA: Insufficient documentation

## 2022-05-20 DIAGNOSIS — Z7901 Long term (current) use of anticoagulants: Secondary | ICD-10-CM | POA: Diagnosis not present

## 2022-05-20 DIAGNOSIS — I700 Atherosclerosis of aorta: Secondary | ICD-10-CM | POA: Insufficient documentation

## 2022-05-20 DIAGNOSIS — K7689 Other specified diseases of liver: Secondary | ICD-10-CM | POA: Diagnosis not present

## 2022-05-20 DIAGNOSIS — I2699 Other pulmonary embolism without acute cor pulmonale: Secondary | ICD-10-CM | POA: Diagnosis not present

## 2022-05-20 DIAGNOSIS — K8689 Other specified diseases of pancreas: Secondary | ICD-10-CM | POA: Diagnosis not present

## 2022-05-20 DIAGNOSIS — I2510 Atherosclerotic heart disease of native coronary artery without angina pectoris: Secondary | ICD-10-CM | POA: Insufficient documentation

## 2022-05-20 DIAGNOSIS — Z9221 Personal history of antineoplastic chemotherapy: Secondary | ICD-10-CM | POA: Diagnosis not present

## 2022-05-20 LAB — CBC WITH DIFFERENTIAL (CANCER CENTER ONLY)
Abs Immature Granulocytes: 0.02 10*3/uL (ref 0.00–0.07)
Basophils Absolute: 0.1 10*3/uL (ref 0.0–0.1)
Basophils Relative: 1 %
Eosinophils Absolute: 0.3 10*3/uL (ref 0.0–0.5)
Eosinophils Relative: 5 %
HCT: 38.4 % — ABNORMAL LOW (ref 39.0–52.0)
Hemoglobin: 13 g/dL (ref 13.0–17.0)
Immature Granulocytes: 0 %
Lymphocytes Relative: 19 %
Lymphs Abs: 1.1 10*3/uL (ref 0.7–4.0)
MCH: 32.8 pg (ref 26.0–34.0)
MCHC: 33.9 g/dL (ref 30.0–36.0)
MCV: 97 fL (ref 80.0–100.0)
Monocytes Absolute: 0.5 10*3/uL (ref 0.1–1.0)
Monocytes Relative: 9 %
Neutro Abs: 3.6 10*3/uL (ref 1.7–7.7)
Neutrophils Relative %: 66 %
Platelet Count: 146 10*3/uL — ABNORMAL LOW (ref 150–400)
RBC: 3.96 MIL/uL — ABNORMAL LOW (ref 4.22–5.81)
RDW: 13.5 % (ref 11.5–15.5)
WBC Count: 5.5 10*3/uL (ref 4.0–10.5)
nRBC: 0 % (ref 0.0–0.2)

## 2022-05-20 LAB — CMP (CANCER CENTER ONLY)
ALT: 8 U/L (ref 0–44)
AST: 13 U/L — ABNORMAL LOW (ref 15–41)
Albumin: 4.1 g/dL (ref 3.5–5.0)
Alkaline Phosphatase: 95 U/L (ref 38–126)
Anion gap: 4 — ABNORMAL LOW (ref 5–15)
BUN: 38 mg/dL — ABNORMAL HIGH (ref 8–23)
CO2: 27 mmol/L (ref 22–32)
Calcium: 9.2 mg/dL (ref 8.9–10.3)
Chloride: 107 mmol/L (ref 98–111)
Creatinine: 1.42 mg/dL — ABNORMAL HIGH (ref 0.61–1.24)
GFR, Estimated: 51 mL/min — ABNORMAL LOW (ref >60)
Glucose, Bld: 107 mg/dL — ABNORMAL HIGH (ref 70–99)
Potassium: 5.1 mmol/L (ref 3.5–5.1)
Sodium: 138 mmol/L (ref 135–145)
Total Bilirubin: 0.3 mg/dL (ref 0.3–1.2)
Total Protein: 7.2 g/dL (ref 6.5–8.1)

## 2022-05-20 MED ORDER — SODIUM CHLORIDE 0.9% FLUSH
10.0000 mL | Freq: Once | INTRAVENOUS | Status: AC
  Administered 2022-05-20: 10 mL

## 2022-05-20 MED ORDER — HEPARIN SOD (PORK) LOCK FLUSH 100 UNIT/ML IV SOLN
500.0000 [IU] | Freq: Once | INTRAVENOUS | Status: AC
  Administered 2022-05-20: 500 [IU]

## 2022-05-29 NOTE — Progress Notes (Unsigned)
[facility] OFFICE PROGRESS NOTE  [facility] [address] [place] [address]  DIAGNOSIS:  1) new right lower lobe pulmonary nodule suspicious for metachronous lung cancer. 2) Recurrent non-small cell lung cancer initially diagnosed as stage IIIA September 2006.  2) diagnosis of early stage bladder cancer in 2014: Status post resection followed by 6 months of intravesical BCG. 3) He has recurrent hematuria. Repeat cystoscopy and biopsy 01/16/2013: CIS, normal RPGs. CT scan abdomen: bilateral renal cysts. No adenopathy. Cystoscopy and biopsy December 2015: Positive cytologies 4) recurrent non-small cell lung cancer, adenocarcinoma involving the left lower lobe diagnosed in November 2015.  PRIOR THERAPY: Status post 3 cycles of neoadjuvant chemotherapy with carboplatin and docetaxel, last dose was given November 22, 2004. Status post left upper lobectomy with lymph node dissection under the care of Dr. Edwyna Shell on January 11, 2005. Status post pericardial window on February 02, 2005 for evacuation of postoperative pericardial tamponade and the fluid was negative for malignancy. Status post 3 cycles of adjuvant chemotherapy with carboplatin and gemcitabine. Last dose was given May 13, 2005. Status post 5 cycles of systemic chemotherapy with carboplatin, paclitaxel and Avastin for disease recurrence. Last dose was given January 04, 2006 and the patient had stable disease by the end of the last cycle. Status post maintenance treatment with Avastin 15 mg/kg given every 3 weeks. The patient is status post 42 cycles, discontinued on July 02, 2008 after the patient had stable disease for more than 2 years. Status post Bilateral selective ureteral cytology, Bilateral retrograde pyelography, Transurethral resection of bladder tumor, Random bladder biopsies, Prostatic urethral biopsy and Transrectal biopsy of the prostate under the care of Dr. Fontaine No at [facility]. Status post redo thoracotomy with left pneumonectomy under the care of Dr. Armandina Stammer at [facility] on 03/05/2014. Status post right nephroureterectomy at [facility] on July 13, 2020  CURRENT THERAPY: Eliquis 5 mg p.o. twice daily.   INTERVAL HISTORY: Wesley Harmon 77 y.o. male returns to the clinic today for a follow up visit. The patient was last seen by Dr. Arbutus Ped on 01/19/22. He denies any major changes in his health since last being seen. He is followed for his history of lung cancer. He is also on blood thinner for his history of pulmonary embolism and atrial fibrillation.  Denies any fever, chills, night sweats, or weight loss. Denies any chest pain, shortness of breath, cough, or hemoptysis. Denies any nausea, vomiting, diarrhea, or constipation. Denies any headache or visual changes. Denies any rashes or skin changes. He recently had a restaging CT scan performed. The patient is here today for evaluation and to review his scan results.    MEDICAL HISTORY: Past Medical History:  Diagnosis Date   Bladder cancer (HCC) 01/02/13   GERD (gastroesophageal reflux disease)    Hyperlipemia    Hypertension    lung ca dx'd 07/2004   chemo comp 06/2008   Lung cancer (HCC)    Neuropathy    Presence of urostomy (HCC)     ALLERGIES:  is allergic to dilaudid [hydromorphone hcl] and gemcitabine.  MEDICATIONS:  Current Outpatient Medications  Medication Sig Dispense Refill   Albuterol Sulfate 108 (90 Base) MCG/ACT AEPB Inhale 2 puffs into the lungs every 6 (six) hours as needed. (Patient not taking: Reported on 04/06/2021)     apixaban (ELIQUIS) 5 MG TABS tablet Take 5 mg by mouth 2 (two) times daily.     Ascorbic Acid (VITAMIN C) 1000 MG  tablet Take 1,000 mg by mouth every morning.     aspirin EC 81 MG tablet Take 81 mg by mouth every morning.     atorvastatin (LIPITOR) 40 MG tablet Take 40 mg by mouth at bedtime.     Cholecalciferol (VITAMIN D) 50 MCG (2000 UT)  tablet Take 2,000 Units by mouth every morning.     enoxaparin (LOVENOX) 100 MG/ML injection SMARTSIG:100 SUB-Q Daily (Patient not taking: Reported on 08/30/2021)     gabapentin (NEURONTIN) 300 MG capsule Take 300 mg by mouth 2 (two) times daily.     levothyroxine (SYNTHROID) 50 MCG tablet Take 50 mcg by mouth daily before breakfast.     Multiple Vitamins-Minerals (MULTIVITAMIN WITH MINERALS) tablet Take 1 tablet by mouth every morning.     pantoprazole (PROTONIX) 40 MG tablet Take 1 tablet (40 mg total) by mouth daily. 30 tablet 0   Polyethylene Glycol 3350 (PEG 3350) 17 GM/SCOOP POWD Take 17 g by mouth daily.     primidone (MYSOLINE) 50 MG tablet Take 150 mg by mouth at bedtime.     No current facility-administered medications for this visit.    SURGICAL HISTORY:  Past Surgical History:  Procedure Laterality Date   arm surgery     CARDIAC SURGERY     CHOLECYSTECTOMY     HERNIA REPAIR     NEPHRECTOMY Right    PNEUMONECTOMY Left     REVIEW OF SYSTEMS:   Review of Systems  Constitutional: Negative for appetite change, chills, fatigue, fever and unexpected weight change.  HENT:   Negative for mouth sores, nosebleeds, sore throat and trouble swallowing.   Eyes: Negative for eye problems and icterus.  Respiratory: Negative for cough, hemoptysis, shortness of breath and wheezing.   Cardiovascular: Negative for chest pain and leg swelling.  Gastrointestinal: Negative for abdominal pain, constipation, diarrhea, nausea and vomiting.  Genitourinary: Negative for bladder incontinence, difficulty urinating, dysuria, frequency and hematuria.   Musculoskeletal: Negative for back pain, gait problem, neck pain and neck stiffness.  Skin: Negative for itching and rash.  Neurological: Negative for dizziness, extremity weakness, gait problem, headaches, light-headedness and seizures.  Hematological: Negative for adenopathy. Does not bruise/bleed easily.  Psychiatric/Behavioral: Negative for  confusion, depression and sleep disturbance. The patient is not nervous/anxious.     PHYSICAL EXAMINATION:  There were no vitals taken for this visit.  ECOG PERFORMANCE STATUS: {CHL ONC ECOG [id number]  Physical Exam  Constitutional: Oriented to person, place, and time and well-developed, well-nourished, and in no distress. No distress.  HENT:  Head: Normocephalic and atraumatic.  Mouth/Throat: Oropharynx is clear and moist. No oropharyngeal exudate.  Eyes: Conjunctivae are normal. Right eye exhibits no discharge. Left eye exhibits no discharge. No scleral icterus.  Neck: Normal range of motion. Neck supple.  Cardiovascular: Normal rate, regular rhythm, normal heart sounds and intact distal pulses.   Pulmonary/Chest: Effort normal and breath sounds normal. No respiratory distress. No wheezes. No rales.  Abdominal: Soft. Bowel sounds are normal. Exhibits no distension and no mass. There is no tenderness.  Musculoskeletal: Normal range of motion. Exhibits no edema.  Lymphadenopathy:    No cervical adenopathy.  Neurological: Alert and oriented to person, place, and time. Exhibits normal muscle tone. Gait normal. Coordination normal.  Skin: Skin is warm and dry. No rash noted. Not diaphoretic. No erythema. No pallor.  Psychiatric: Mood, memory and judgment normal.  Vitals reviewed.  LABORATORY DATA: Lab Results  Component Value Date   WBC 5.5 05/20/2022   HGB 13.0  05/20/2022   HCT 38.4 (L) 05/20/2022   MCV 97.0 05/20/2022   PLT 146 (L) 05/20/2022      Chemistry      Component Value Date/Time   NA 138 05/20/2022 0735   NA 138 08/24/2016 1501   K 5.1 05/20/2022 0735   K 4.0 08/24/2016 1501   CL 107 05/20/2022 0735   CL 102 09/30/2011 0843   CO2 27 05/20/2022 0735   CO2 26 08/24/2016 1501   BUN 38 (H) 05/20/2022 0735   BUN 23.0 08/24/2016 1501   CREATININE 1.42 (H) 05/20/2022 0735   CREATININE 1.1 08/24/2016 1501      Component Value Date/Time   CALCIUM 9.2  05/20/2022 0735   CALCIUM 9.3 08/24/2016 1501   ALKPHOS 95 05/20/2022 0735   ALKPHOS 111 08/24/2016 1501   AST 13 (L) 05/20/2022 0735   AST 19 08/24/2016 1501   ALT 8 05/20/2022 0735   ALT 16 08/24/2016 1501   BILITOT 0.3 05/20/2022 0735   BILITOT 0.72 08/24/2016 1501       RADIOGRAPHIC STUDIES:  CT Chest Wo Contrast  Result Date: 05/24/2022 CLINICAL DATA:  Bladder cancer, staging. Diagnosed most recently in 2016 with urostomy. Non-small-cell lung cancer with left-sided lumpectomy. Chemotherapy complete. History of right lower lobe radiation therapy. * Tracking Code: BO * EXAM: CT CHEST, ABDOMEN AND PELVIS WITHOUT CONTRAST TECHNIQUE: Multidetector CT imaging of the chest, abdomen and pelvis was performed following the standard protocol without IV contrast. RADIATION DOSE REDUCTION: This exam was performed according to the departmental dose-optimization program which includes automated exposure control, adjustment of the mA and/or kV according to patient size and/or use of iterative reconstruction technique. COMPARISON:  06/09/2021 staging CTs. 04/01/2022 interval abdominal CT from [facility]. Clinic note of 01/19/2022 also reviewed. FINDINGS: CT CHEST FINDINGS Cardiovascular: Pacer. Left Port-A-Cath tip at high right atrium. Aortic atherosclerosis. Normal heart size with lad and left main coronary artery calcification. Mediastinum/Nodes: No supraclavicular adenopathy. No mediastinal or hilar adenopathy, given limitations of unenhanced CT. Lungs/Pleura: No pleural fluid. Status post left pneumonectomy, with similar trace fluid in the pneumonectomy bed. No left-sided recurrence. Moderate centrilobular emphysema. Right upper lobe interstitial thickening and mild nodularity posteriorly, including at 5 mm on 50/4 are similar. Superior segment right lower lobe architectural distortion and mild consolidation are similar, without residual or locally recurrent disease. Musculoskeletal: moderate  bilateral gynecomastia. Left-sided thoracotomy changes. CT ABDOMEN PELVIS FINDINGS Hepatobiliary: High left hepatic lobe cysts. Cholecystectomy, without biliary ductal dilatation. Pancreas: Pancreatic atrophy is within normal variation for age. No duct dilatation or acute inflammation. Spleen: Normal in size, without focal abnormality. Adrenals/Urinary Tract: Normal adrenal glands. Right nephrectomy without local recurrence. No left renal calculi. Left renal low-density lesions are likely cysts including up to 2.6 cm. These are incompletely characterized. No left-sided hydronephrosis. Status post cystectomy and ileal conduit. Again identified is a parastomal hernia containing nonobstructive small bowel. Stomach/Bowel: Normal stomach, without wall thickening. Colonic stool burden suggests constipation. Transverse colon is positioned within an area of ventral abdominal wall laxity, similar. Normal terminal ileum and appendix. Otherwise normal small bowel. Vascular/Lymphatic: Aortic atherosclerosis. Mild ectasia of the right common iliac artery at 1.6 cm is unchanged. Pelvic node dissection. No abdominopelvic adenopathy. Reproductive: Prostatectomy. Other: No significant free fluid. Small bilateral fat containing inguinal hernias. Musculoskeletal: No acute osseous abnormality. IMPRESSION: 1. Extensive surgical changes, including left pneumonectomy, right nephrectomy, cystectomy, and ileal conduit. No evidence of recurrent or metastatic disease. 2. Similar appearance of right lower lobe presumably radiation  induced consolidation and architectural distortion. 3. 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 4.  Possible constipation. 5. Aortic atherosclerosis (ICD10-I70.0), coronary artery atherosclerosis and emphysema (ICD10-J43.9). Electronically Signed   By: Jeronimo Greaves M.D.   On: 05/24/2022 11:18   CT Abdomen Pelvis Wo Contrast  Result Date: 05/24/2022 CLINICAL DATA:  Bladder cancer, staging.  Diagnosed most recently in 2016 with urostomy. Non-small-cell lung cancer with left-sided lumpectomy. Chemotherapy complete. History of right lower lobe radiation therapy. * Tracking Code: BO * EXAM: CT CHEST, ABDOMEN AND PELVIS WITHOUT CONTRAST TECHNIQUE: Multidetector CT imaging of the chest, abdomen and pelvis was performed following the standard protocol without IV contrast. RADIATION DOSE REDUCTION: This exam was performed according to the departmental dose-optimization program which includes automated exposure control, adjustment of the mA and/or kV according to patient size and/or use of iterative reconstruction technique. COMPARISON:  06/09/2021 staging CTs. 04/01/2022 interval abdominal CT from [facility]. Clinic note of 01/19/2022 also reviewed. FINDINGS: CT CHEST FINDINGS Cardiovascular: Pacer. Left Port-A-Cath tip at high right atrium. Aortic atherosclerosis. Normal heart size with lad and left main coronary artery calcification. Mediastinum/Nodes: No supraclavicular adenopathy. No mediastinal or hilar adenopathy, given limitations of unenhanced CT. Lungs/Pleura: No pleural fluid. Status post left pneumonectomy, with similar trace fluid in the pneumonectomy bed. No left-sided recurrence. Moderate centrilobular emphysema. Right upper lobe interstitial thickening and mild nodularity posteriorly, including at 5 mm on 50/4 are similar. Superior segment right lower lobe architectural distortion and mild consolidation are similar, without residual or locally recurrent disease. Musculoskeletal: moderate bilateral gynecomastia. Left-sided thoracotomy changes. CT ABDOMEN PELVIS FINDINGS Hepatobiliary: High left hepatic lobe cysts. Cholecystectomy, without biliary ductal dilatation. Pancreas: Pancreatic atrophy is within normal variation for age. No duct dilatation or acute inflammation. Spleen: Normal in size, without focal abnormality. Adrenals/Urinary Tract: Normal adrenal glands. Right nephrectomy  without local recurrence. No left renal calculi. Left renal low-density lesions are likely cysts including up to 2.6 cm. These are incompletely characterized. No left-sided hydronephrosis. Status post cystectomy and ileal conduit. Again identified is a parastomal hernia containing nonobstructive small bowel. Stomach/Bowel: Normal stomach, without wall thickening. Colonic stool burden suggests constipation. Transverse colon is positioned within an area of ventral abdominal wall laxity, similar. Normal terminal ileum and appendix. Otherwise normal small bowel. Vascular/Lymphatic: Aortic atherosclerosis. Mild ectasia of the right common iliac artery at 1.6 cm is unchanged. Pelvic node dissection. No abdominopelvic adenopathy. Reproductive: Prostatectomy. Other: No significant free fluid. Small bilateral fat containing inguinal hernias. Musculoskeletal: No acute osseous abnormality. IMPRESSION: 1. Extensive surgical changes, including left pneumonectomy, right nephrectomy, cystectomy, and ileal conduit. No evidence of recurrent or metastatic disease. 2. Similar appearance of right lower lobe presumably radiation induced consolidation and architectural distortion. 3. 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 4.  Possible constipation. 5. Aortic atherosclerosis (ICD10-I70.0), coronary artery atherosclerosis and emphysema (ICD10-J43.9). Electronically Signed   By: Jeronimo Greaves M.D.   On: 05/24/2022 11:18     ASSESSMENT/PLAN:  This is a very pleasant 77 year old Caucasian male with:   1)  recurrent non-small cell lung cancer status post several treatment regimen including neoadjuvant chemotherapy followed by left upper lobectomy followed by systemic chemotherapy as well as maintenance treatment with Avastin and has been observation since June of 2010 and with no evidence for disease recurrence until November 2015 when he was found to have hypermetabolic activity in the left lower lobe.. The patient  underwent redo thoracotomy with complete left pneumonectomy His imaging studies including CT scan  of the chest as well as a PET scan in August 2021 showed a suspicious hypermetabolic nodule in the right lung.  The patient underwent CT-guided biopsy of this lesion but it was negative for malignancy.  Unfortunately further imaging studies scan showed increase in the size of the irregular solid 2.0 cm superior right lower lobe pulmonary nodule suspicious for metachronous primary bronchogenic carcinoma versus metastasis. This was highly suspicious for malignancy even with the negative previous biopsy. He underwent SBRT to this lesion under the care of Dr. Mitzi Hansen.   The patient is currently on observation and he is doing fine with no concerning complaints.  He recently had a restaging CT scan. The patient was seen with Dr. Arbutus Ped. Dr. Arbutus Ped personally and independently reviewed the scan and discussed the results with the patient today. The scan showed no evidence of disease progression.   I recommended for the patient to continue on observation with repeat CT scan of the chest, abdomen and pelvis in around 4*** months for restaging of his lung cancer and bladder cancer.    2) history of bladder cancer: Status post resection at [facility].he is currently followed by Dr.Tsivinn at [facility].  The recent biopsy confirmed a high-grade urothelial carcinoma.  He underwent right nephroureterectomy at [facility] on July 12, 2020 followed by urology at that facility.  He is currently on observation.    3) for the recently diagnosed pulmonary embolism and atrial fibrillation, the patient developed new left external iliac/femoral vein thrombus during his visit at [facility].  He was of anticoagulation at that time.  He is current on treatment with Eliquis and tolerating it fairly well.   The patient was advised to call immediately if he has any concerning symptoms in the  interval. The patient voices understanding of current disease status and treatment options and is in agreement with the current care plan. All questions were answered. The patient knows to call the clinic with any problems, questions or concerns. We can certainly see the patient much sooner if necessary    No orders of the defined types were placed in this encounter.    I spent {CHL ONC TIME VISIT - ZOXWR:[id number]} counseling the patient face to face. The total time spent in the appointment was {CHL ONC TIME VISIT - BJYNW:[id number]}.  Brissa Asante L Venesha Petraitis, PA-C 05/29/22

## 2022-05-30 ENCOUNTER — Other Ambulatory Visit: Payer: Self-pay

## 2022-05-30 ENCOUNTER — Other Ambulatory Visit: Payer: Medicare Other

## 2022-06-01 ENCOUNTER — Ambulatory Visit: Payer: Medicare Other | Admitting: Internal Medicine

## 2022-06-01 ENCOUNTER — Inpatient Hospital Stay (HOSPITAL_BASED_OUTPATIENT_CLINIC_OR_DEPARTMENT_OTHER): Payer: Medicare Other | Admitting: Physician Assistant

## 2022-06-01 VITALS — BP 99/72 | HR 110 | Temp 97.7°F | Resp 17 | Wt 234.0 lb

## 2022-06-01 DIAGNOSIS — C3432 Malignant neoplasm of lower lobe, left bronchus or lung: Secondary | ICD-10-CM | POA: Diagnosis not present

## 2022-06-01 DIAGNOSIS — R911 Solitary pulmonary nodule: Secondary | ICD-10-CM | POA: Diagnosis not present

## 2022-07-27 ENCOUNTER — Inpatient Hospital Stay: Payer: Medicare Other | Attending: Internal Medicine

## 2022-07-27 DIAGNOSIS — Z85118 Personal history of other malignant neoplasm of bronchus and lung: Secondary | ICD-10-CM | POA: Insufficient documentation

## 2022-07-27 DIAGNOSIS — Z452 Encounter for adjustment and management of vascular access device: Secondary | ICD-10-CM | POA: Insufficient documentation

## 2022-07-28 ENCOUNTER — Inpatient Hospital Stay: Payer: Medicare Other

## 2022-08-10 ENCOUNTER — Other Ambulatory Visit: Payer: Self-pay

## 2022-08-10 ENCOUNTER — Inpatient Hospital Stay: Payer: Medicare Other

## 2022-08-10 DIAGNOSIS — Z85118 Personal history of other malignant neoplasm of bronchus and lung: Secondary | ICD-10-CM | POA: Diagnosis present

## 2022-08-10 DIAGNOSIS — C3432 Malignant neoplasm of lower lobe, left bronchus or lung: Secondary | ICD-10-CM

## 2022-08-10 DIAGNOSIS — Z452 Encounter for adjustment and management of vascular access device: Secondary | ICD-10-CM | POA: Diagnosis not present

## 2022-08-10 DIAGNOSIS — Z95828 Presence of other vascular implants and grafts: Secondary | ICD-10-CM

## 2022-08-10 MED ORDER — SODIUM CHLORIDE 0.9% FLUSH
10.0000 mL | Freq: Once | INTRAVENOUS | Status: AC
  Administered 2022-08-10: 10 mL

## 2022-08-10 MED ORDER — HEPARIN SOD (PORK) LOCK FLUSH 100 UNIT/ML IV SOLN
500.0000 [IU] | Freq: Once | INTRAVENOUS | Status: AC
  Administered 2022-08-10: 500 [IU]

## 2022-08-11 ENCOUNTER — Encounter: Payer: Self-pay | Admitting: Internal Medicine

## 2022-09-21 ENCOUNTER — Inpatient Hospital Stay: Payer: Medicare Other | Attending: Internal Medicine

## 2022-09-21 DIAGNOSIS — Z452 Encounter for adjustment and management of vascular access device: Secondary | ICD-10-CM | POA: Diagnosis present

## 2022-09-21 DIAGNOSIS — Z95828 Presence of other vascular implants and grafts: Secondary | ICD-10-CM

## 2022-09-21 DIAGNOSIS — Z85118 Personal history of other malignant neoplasm of bronchus and lung: Secondary | ICD-10-CM | POA: Insufficient documentation

## 2022-09-21 DIAGNOSIS — C3432 Malignant neoplasm of lower lobe, left bronchus or lung: Secondary | ICD-10-CM

## 2022-09-21 MED ORDER — SODIUM CHLORIDE 0.9% FLUSH
10.0000 mL | Freq: Once | INTRAVENOUS | Status: AC
  Administered 2022-09-21: 10 mL

## 2022-09-21 MED ORDER — HEPARIN SOD (PORK) LOCK FLUSH 100 UNIT/ML IV SOLN
500.0000 [IU] | Freq: Once | INTRAVENOUS | Status: AC
  Administered 2022-09-21: 500 [IU]

## 2022-09-22 ENCOUNTER — Inpatient Hospital Stay: Payer: Medicare Other

## 2022-11-16 ENCOUNTER — Inpatient Hospital Stay: Payer: Medicare Other | Attending: Internal Medicine

## 2022-11-16 DIAGNOSIS — Z85118 Personal history of other malignant neoplasm of bronchus and lung: Secondary | ICD-10-CM | POA: Insufficient documentation

## 2022-11-16 DIAGNOSIS — C3432 Malignant neoplasm of lower lobe, left bronchus or lung: Secondary | ICD-10-CM

## 2022-11-16 DIAGNOSIS — Z95828 Presence of other vascular implants and grafts: Secondary | ICD-10-CM

## 2022-11-16 DIAGNOSIS — Z452 Encounter for adjustment and management of vascular access device: Secondary | ICD-10-CM | POA: Diagnosis present

## 2022-11-16 MED ORDER — SODIUM CHLORIDE FLUSH 0.9 % IV SOLN
10.0000 mL | INTRAVENOUS | Status: DC | PRN
Start: 2022-11-16 — End: 2022-11-16
  Administered 2022-11-16: 10 mL via INTRAVENOUS

## 2022-11-16 MED ORDER — HEPARIN SOD (PORK) LOCK FLUSH 100 UNIT/ML IV SOLN
500.0000 [IU] | Freq: Once | INTRAVENOUS | Status: AC | PRN
Start: 2022-11-16 — End: 2022-11-16
  Administered 2022-11-16: 500 [IU] via INTRAVENOUS

## 2022-11-17 ENCOUNTER — Inpatient Hospital Stay: Payer: Medicare Other

## 2023-01-12 ENCOUNTER — Inpatient Hospital Stay: Payer: Non-veteran care | Attending: Internal Medicine

## 2023-01-12 DIAGNOSIS — Z452 Encounter for adjustment and management of vascular access device: Secondary | ICD-10-CM | POA: Insufficient documentation

## 2023-01-12 DIAGNOSIS — Z85118 Personal history of other malignant neoplasm of bronchus and lung: Secondary | ICD-10-CM | POA: Insufficient documentation

## 2023-01-12 DIAGNOSIS — C3432 Malignant neoplasm of lower lobe, left bronchus or lung: Secondary | ICD-10-CM

## 2023-01-12 DIAGNOSIS — Z95828 Presence of other vascular implants and grafts: Secondary | ICD-10-CM

## 2023-01-12 MED ORDER — HEPARIN SOD (PORK) LOCK FLUSH 100 UNIT/ML IV SOLN
500.0000 [IU] | Freq: Once | INTRAVENOUS | Status: AC
  Administered 2023-01-12: 500 [IU]

## 2023-01-12 MED ORDER — SODIUM CHLORIDE 0.9% FLUSH
10.0000 mL | Freq: Once | INTRAVENOUS | Status: AC
  Administered 2023-01-12: 10 mL

## 2023-03-08 ENCOUNTER — Inpatient Hospital Stay: Payer: Non-veteran care | Attending: Internal Medicine

## 2023-03-08 DIAGNOSIS — Z85118 Personal history of other malignant neoplasm of bronchus and lung: Secondary | ICD-10-CM | POA: Diagnosis present

## 2023-03-08 DIAGNOSIS — Z95828 Presence of other vascular implants and grafts: Secondary | ICD-10-CM

## 2023-03-08 DIAGNOSIS — C3432 Malignant neoplasm of lower lobe, left bronchus or lung: Secondary | ICD-10-CM

## 2023-03-08 LAB — CMP (CANCER CENTER ONLY)
ALT: 7 U/L (ref 0–44)
AST: 11 U/L — ABNORMAL LOW (ref 15–41)
Albumin: 3.9 g/dL (ref 3.5–5.0)
Alkaline Phosphatase: 94 U/L (ref 38–126)
Anion gap: 5 (ref 5–15)
BUN: 36 mg/dL — ABNORMAL HIGH (ref 8–23)
CO2: 25 mmol/L (ref 22–32)
Calcium: 9 mg/dL (ref 8.9–10.3)
Chloride: 107 mmol/L (ref 98–111)
Creatinine: 1.42 mg/dL — ABNORMAL HIGH (ref 0.61–1.24)
GFR, Estimated: 50 mL/min — ABNORMAL LOW (ref >60)
Glucose, Bld: 90 mg/dL (ref 70–99)
Potassium: 4.7 mmol/L (ref 3.5–5.1)
Sodium: 137 mmol/L (ref 135–145)
Total Bilirubin: 0.3 mg/dL (ref 0.0–1.2)
Total Protein: 6.5 g/dL (ref 6.5–8.1)

## 2023-03-08 LAB — CBC WITH DIFFERENTIAL (CANCER CENTER ONLY)
Abs Immature Granulocytes: 0.01 10*3/uL (ref 0.00–0.07)
Basophils Absolute: 0 10*3/uL (ref 0.0–0.1)
Basophils Relative: 1 %
Eosinophils Absolute: 0.1 10*3/uL (ref 0.0–0.5)
Eosinophils Relative: 2 %
HCT: 35.4 % — ABNORMAL LOW (ref 39.0–52.0)
Hemoglobin: 11.7 g/dL — ABNORMAL LOW (ref 13.0–17.0)
Immature Granulocytes: 0 %
Lymphocytes Relative: 20 %
Lymphs Abs: 1.1 10*3/uL (ref 0.7–4.0)
MCH: 32.8 pg (ref 26.0–34.0)
MCHC: 33.1 g/dL (ref 30.0–36.0)
MCV: 99.2 fL (ref 80.0–100.0)
Monocytes Absolute: 0.4 10*3/uL (ref 0.1–1.0)
Monocytes Relative: 7 %
Neutro Abs: 3.6 10*3/uL (ref 1.7–7.7)
Neutrophils Relative %: 70 %
Platelet Count: 153 10*3/uL (ref 150–400)
RBC: 3.57 MIL/uL — ABNORMAL LOW (ref 4.22–5.81)
RDW: 13.8 % (ref 11.5–15.5)
WBC Count: 5.2 10*3/uL (ref 4.0–10.5)
nRBC: 0 % (ref 0.0–0.2)

## 2023-03-08 MED ORDER — HEPARIN SOD (PORK) LOCK FLUSH 100 UNIT/ML IV SOLN
500.0000 [IU] | Freq: Once | INTRAVENOUS | Status: AC
  Administered 2023-03-08: 500 [IU]

## 2023-03-08 MED ORDER — SODIUM CHLORIDE 0.9% FLUSH
10.0000 mL | Freq: Once | INTRAVENOUS | Status: AC
  Administered 2023-03-08: 10 mL

## 2023-03-17 ENCOUNTER — Telehealth: Payer: Self-pay | Admitting: Medical Oncology

## 2023-03-17 NOTE — Telephone Encounter (Signed)
 Pt wants an appt with Dr. Arbutus Ped.    Dr. Felicita Gage @[facility]  told him " I had an  abnormal CT scan today. I have a small spot on the top of my right lung".   LVM for Dr. Felicita Gage to return my call . CT results not in care everywhere.

## 2023-03-17 NOTE — Telephone Encounter (Signed)
 Dr Felicita Gage returned my call . He said pt was having some R sided chest pain x 2 days with deep breath or rotation of chest . Oxygen 95%, no dyspnea. No distress at all.  CXR > abnormal atelectesis , poss pneumonia, so CT ( noncontrast) done .  Per PCP CT report  showed. " RLL irregular mass 2.6x3.3x1.2 , RLL - ground glass appearance, RUL 6 mmx 7 mm nodule RUL."  CT report not available

## 2023-03-20 ENCOUNTER — Telehealth: Payer: Self-pay | Admitting: Medical Oncology

## 2023-03-20 ENCOUNTER — Other Ambulatory Visit: Payer: Self-pay | Admitting: Medical Oncology

## 2023-03-20 NOTE — Telephone Encounter (Signed)
 I told pt he will be scheduled to see Dr. Arbutus Ped in 1-2 weeks after I get the CT scan on a disk.  "So I have to be in pain until I see him". I told him to call PCP . He said , "I have 7 oxycodone left from jan and Ill call Dr Felicita Gage if I need more".

## 2023-03-21 ENCOUNTER — Telehealth: Payer: Self-pay | Admitting: Medical Oncology

## 2023-03-21 NOTE — Telephone Encounter (Signed)
 LVM to send CT images to Dr Arbutus Ped on a disk.

## 2023-03-21 NOTE — Telephone Encounter (Addendum)
 Wesley Harmon ( with home based program ) called back and said she will mail the CD to [facility] in radiology. [name] will upload the images to [facility]

## 2023-03-21 NOTE — Telephone Encounter (Addendum)
 Returned call to [place] to give her pt information . Dr. Arbutus Ped needs his recent CT images on a disk and sent to him.  Pt saw a Dr. Felicita Gage at [facility].

## 2023-03-24 ENCOUNTER — Telehealth: Payer: Self-pay | Admitting: Medical Oncology

## 2023-03-24 NOTE — Telephone Encounter (Signed)
 I told pt I requested his scan images on Tuesday.Appt made for f/u 03/17.

## 2023-03-24 NOTE — Telephone Encounter (Signed)
 LVM for Wesley Harmon at [facility] to return my call . I need to know if she sent CT scan images on disk to [facility] in radiology

## 2023-03-24 NOTE — Telephone Encounter (Signed)
 CD disk taken to Radiology.

## 2023-03-27 ENCOUNTER — Other Ambulatory Visit (HOSPITAL_COMMUNITY): Payer: Self-pay | Admitting: Internal Medicine

## 2023-03-27 ENCOUNTER — Inpatient Hospital Stay
Admission: RE | Admit: 2023-03-27 | Discharge: 2023-03-27 | Disposition: A | Discharge status: Home / Self Care | Payer: Self-pay | Source: Ambulatory Visit | Attending: Internal Medicine | Admitting: Internal Medicine

## 2023-03-27 DIAGNOSIS — C3492 Malignant neoplasm of unspecified part of left bronchus or lung: Secondary | ICD-10-CM

## 2023-04-03 ENCOUNTER — Inpatient Hospital Stay: Attending: Internal Medicine | Admitting: Internal Medicine

## 2023-04-03 VITALS — BP 112/69 | HR 88 | Temp 97.6°F | Resp 16 | Ht 69.0 in | Wt 197.2 lb

## 2023-04-03 DIAGNOSIS — R21 Rash and other nonspecific skin eruption: Secondary | ICD-10-CM | POA: Insufficient documentation

## 2023-04-03 DIAGNOSIS — I4891 Unspecified atrial fibrillation: Secondary | ICD-10-CM | POA: Insufficient documentation

## 2023-04-03 DIAGNOSIS — Z79899 Other long term (current) drug therapy: Secondary | ICD-10-CM | POA: Insufficient documentation

## 2023-04-03 DIAGNOSIS — R634 Abnormal weight loss: Secondary | ICD-10-CM | POA: Diagnosis not present

## 2023-04-03 DIAGNOSIS — R079 Chest pain, unspecified: Secondary | ICD-10-CM | POA: Diagnosis not present

## 2023-04-03 DIAGNOSIS — Z885 Allergy status to narcotic agent status: Secondary | ICD-10-CM | POA: Insufficient documentation

## 2023-04-03 DIAGNOSIS — C649 Malignant neoplasm of unspecified kidney, except renal pelvis: Secondary | ICD-10-CM | POA: Diagnosis not present

## 2023-04-03 DIAGNOSIS — I2699 Other pulmonary embolism without acute cor pulmonale: Secondary | ICD-10-CM | POA: Diagnosis not present

## 2023-04-03 DIAGNOSIS — C679 Malignant neoplasm of bladder, unspecified: Secondary | ICD-10-CM | POA: Insufficient documentation

## 2023-04-03 DIAGNOSIS — Z7901 Long term (current) use of anticoagulants: Secondary | ICD-10-CM | POA: Diagnosis not present

## 2023-04-03 DIAGNOSIS — Z85118 Personal history of other malignant neoplasm of bronchus and lung: Secondary | ICD-10-CM | POA: Insufficient documentation

## 2023-04-03 DIAGNOSIS — C3490 Malignant neoplasm of unspecified part of unspecified bronchus or lung: Secondary | ICD-10-CM

## 2023-04-03 MED ORDER — OXYCODONE-ACETAMINOPHEN 5-325 MG PO TABS: 1.0000 | ORAL_TABLET | Freq: Three times a day (TID) | ORAL | 0 refills | Status: DC | PRN

## 2023-04-03 NOTE — Progress Notes (Signed)
 [facility] [phone]   Fax:([phone]  OFFICE PROGRESS NOTE  [facility] [address] [place] [address]  PRINCIPAL DIAGNOSIS:  1) new right lower lobe pulmonary nodule suspicious for metachronous lung cancer. 2) Recurrent non-small cell lung cancer initially diagnosed as stage IIIA September 2006.  2) diagnosis of early stage bladder cancer in 2014: Status post resection followed by 6 months of intravesical BCG. 3) He has recurrent hematuria. Repeat cystoscopy and biopsy 01/16/2013: CIS, normal RPGs. CT scan abdomen: bilateral renal cysts. No adenopathy. Cystoscopy and biopsy December 2015: Positive cytologies 4) recurrent non-small cell lung cancer, adenocarcinoma involving the left lower lobe diagnosed in November 2015.  PRIOR THERAPY:  Status post 3 cycles of neoadjuvant chemotherapy with carboplatin and docetaxel, last dose was given November 22, 2004. Status post left upper lobectomy with lymph node dissection under the care of Dr. Edwyna Shell on January 11, 2005. Status post pericardial window on February 02, 2005 for evacuation of postoperative pericardial tamponade and the fluid was negative for malignancy. Status post 3 cycles of adjuvant chemotherapy with carboplatin and gemcitabine. Last dose was given May 13, 2005. Status post 5 cycles of systemic chemotherapy with carboplatin, paclitaxel and Avastin for disease recurrence. Last dose was given January 04, 2006 and the patient had stable disease by the end of the last cycle. Status post maintenance treatment with Avastin 15 mg/kg given every 3 weeks. The patient is status post 42 cycles, discontinued on July 02, 2008 after the patient had stable disease for more than 2 years. Status post Bilateral selective ureteral cytology, Bilateral retrograde pyelography, Transurethral resection of bladder tumor, Random bladder biopsies, Prostatic urethral biopsy and Transrectal biopsy of the  prostate under the care of Dr. Fontaine No at [facility]. Status post redo thoracotomy with left pneumonectomy under the care of Dr. Armandina Stammer at [facility] on 03/05/2014. Status post right nephroureterectomy at [facility] on July 13, 2020  CURRENT THERAPY: Eliquis 5 mg p.o. twice daily.  INTERVAL HISTORY: Wesley Harmon 78 y.o. male returns to the clinic today for follow-up visit accompanied by his daughter [name].  Discussed the use of AI scribe software for clinical note transcription with the patient, who gave verbal consent to proceed.  History of Present Illness   Wesley Harmon is a 78 year old male with non-small cell lung cancer who presents with right-sided chest pain. He is accompanied by his daughter, [name].  He has been experiencing right-sided chest pain for approximately three weeks. The pain is described as a stabbing sensation, akin to a knife sticking in the area, and it radiates to his back. The pain is severe enough to cause difficulty getting out of bed, necessitating the use of a bar to pull himself up. He is currently taking Percocet for pain management.  A recent CT scan of the chest, following an initial X-ray at the [place], revealed a nodule or mass in the right lung. He recalls having spots in his lung from previous scans, which have been monitored over time. The current scan shows an opacity and mass in the same area as before, but it appears more solid now.  He has a history of non-small cell lung cancer, initially diagnosed as stage IIIA in September 2006, for which he underwent a left pneumonectomy and received chemotherapy. In addition to his lung cancer, he has a history of kidney cancer, for which he underwent a nephroureterectomy in 2022. He has been  on surveillance for several years regarding this condition.  He reports a significant weight loss of 50 pounds, which was intentional through a weight program with the [place]. He notes that  his breathing is good and does not report any other new symptoms. No coughing up blood, nausea, vomiting, or diarrhea.       MEDICAL HISTORY: Past Medical History:  Diagnosis Date   Bladder cancer (HCC) 01/02/13   GERD (gastroesophageal reflux disease)    Hyperlipemia    Hypertension    lung ca dx'd 07/2004   chemo comp 06/2008   Lung cancer (HCC)    Neuropathy    Presence of urostomy (HCC)     ALLERGIES:  is allergic to dilaudid [hydromorphone hcl] and gemcitabine.  MEDICATIONS:  Current Outpatient Medications  Medication Sig Dispense Refill   Albuterol Sulfate 108 (90 Base) MCG/ACT AEPB Inhale 2 puffs into the lungs every 6 (six) hours as needed. (Patient not taking: Reported on 04/06/2021)     apixaban (ELIQUIS) 5 MG TABS tablet Take 5 mg by mouth 2 (two) times daily.     Ascorbic Acid (VITAMIN C) 1000 MG tablet Take 1,000 mg by mouth every morning.     aspirin EC 81 MG tablet Take 81 mg by mouth every morning.     atorvastatin (LIPITOR) 40 MG tablet Take 40 mg by mouth at bedtime.     Cholecalciferol (VITAMIN D) 50 MCG (2000 UT) tablet Take 2,000 Units by mouth every morning.     enoxaparin (LOVENOX) 100 MG/ML injection SMARTSIG:100 SUB-Q Daily (Patient not taking: Reported on 08/30/2021)     gabapentin (NEURONTIN) 300 MG capsule Take 300 mg by mouth 2 (two) times daily.     levothyroxine (SYNTHROID) 50 MCG tablet Take 50 mcg by mouth daily before breakfast.     lisinopril (ZESTRIL) 5 MG tablet Take 1 tablet by mouth daily.     Multiple Vitamins-Minerals (MULTIVITAMIN WITH MINERALS) tablet Take 1 tablet by mouth every morning.     pantoprazole (PROTONIX) 40 MG tablet Take 1 tablet (40 mg total) by mouth daily. 30 tablet 0   Polyethylene Glycol 3350 (PEG 3350) 17 GM/SCOOP POWD Take 17 g by mouth daily.     primidone (MYSOLINE) 50 MG tablet Take 150 mg by mouth at bedtime.     No current facility-administered medications for this visit.    REVIEW OF SYSTEMS:  Constitutional:  negative Eyes: negative Ears, nose, mouth, throat, and face: negative Respiratory: positive for pleurisy/chest pain Cardiovascular: negative Gastrointestinal: negative Genitourinary:negative Integument/breast: negative Hematologic/lymphatic: negative Musculoskeletal:negative Neurological: negative Behavioral/Psych: negative Endocrine: negative Allergic/Immunologic: negative   PHYSICAL EXAMINATION: General appearance: alert, cooperative, fatigued, and no distress Head: Normocephalic, without obvious abnormality, atraumatic Neck: no adenopathy Lymph nodes: Cervical, supraclavicular, and axillary nodes normal. Resp: Clear to auscultation on the right and absent breath sound on the left Back: symmetric, no curvature. ROM normal. No CVA tenderness. Cardio: regular rate and rhythm, S1, S2 normal, no murmur, click, rub or gallop GI: soft, non-tender; bowel sounds normal; no masses,  no organomegaly Extremities: extremities normal, atraumatic, no cyanosis or edema Neurologic: Alert and oriented X 3, normal strength and tone. Normal symmetric reflexes. Normal coordination and gait  ECOG PERFORMANCE STATUS: 1 - Symptomatic but completely ambulatory  Blood pressure 112/69, pulse 88, temperature 97.6 F (36.4 C), temperature source Temporal, resp. rate 16, height 5\' 9"  (1.753 m), weight 197 lb 3.2 oz (89.4 kg), SpO2 100%.  LABORATORY DATA: Lab Results  Component Value Date   WBC  5.2 03/08/2023   HGB 11.7 (L) 03/08/2023   HCT 35.4 (L) 03/08/2023   MCV 99.2 03/08/2023   PLT 153 03/08/2023      Chemistry      Component Value Date/Time   NA 137 03/08/2023 0949   NA 138 08/24/2016 1501   K 4.7 03/08/2023 0949   K 4.0 08/24/2016 1501   CL 107 03/08/2023 0949   CL 102 09/30/2011 0843   CO2 25 03/08/2023 0949   CO2 26 08/24/2016 1501   BUN 36 (H) 03/08/2023 0949   BUN 23.0 08/24/2016 1501   CREATININE 1.42 (H) 03/08/2023 0949   CREATININE 1.1 08/24/2016 1501      Component Value  Date/Time   CALCIUM 9.0 03/08/2023 0949   CALCIUM 9.3 08/24/2016 1501   ALKPHOS 94 03/08/2023 0949   ALKPHOS 111 08/24/2016 1501   AST 11 (L) 03/08/2023 0949   AST 19 08/24/2016 1501   ALT 7 03/08/2023 0949   ALT 16 08/24/2016 1501   BILITOT 0.3 03/08/2023 0949   BILITOT 0.72 08/24/2016 1501       RADIOGRAPHIC STUDIES: CT OUTSIDE FILMS CHEST Result Date: 03/27/2023 This examination belongs to an outside facility and is stored here for comparison purposes only.  Contact the originating outside institution for any associated report or interpretation.  DG Outside Films Chest Result Date: 03/27/2023 This examination belongs to an outside facility and is stored here for comparison purposes only.  Contact the originating outside institution for any associated report or interpretation.   ASSESSMENT AND PLAN: This is a very pleasant 78 years old white male with:  1)  recurrent non-small cell lung cancer status post several treatment regimen including neoadjuvant chemotherapy followed by left upper lobectomy followed by systemic chemotherapy as well as maintenance treatment with Avastin and has been observation since June of 2010 and with no evidence for disease recurrence until November 2015 when he was found to have hypermetabolic activity in the left lower lobe.. The patient underwent redo thoracotomy with complete left pneumonectomy His imaging studies including CT scan of the chest as well as a PET scan in August 2021 showed a suspicious hypermetabolic nodule in the right lung.  The patient underwent CT-guided biopsy of this lesion but it was negative for malignancy.  Unfortunately further imaging studies scan showed increase in the size of the irregular solid 2.0 cm superior right lower lobe pulmonary nodule suspicious for metachronous primary bronchogenic carcinoma versus metastasis. This was highly suspicious for malignancy even with the negative previous biopsy. He underwent SBRT to this  lesion under the care of Dr. Mitzi Hansen.      Right-sided chest pain Joven Mom reports severe right-sided chest pain radiating to the back, persisting for three weeks. The pain is sharp, akin to a knife stabbing. A recent CT scan at the [facility] revealed a nodule or mass in the right lung, more solid compared to the previous scan from May 2024. Differential diagnosis includes possible malignancy or other causes of chest pain. A PET scan is considered to further investigate the mass and assess for potential malignancy. - Order PET scan to further investigate the right lung mass - Prescribe Percocet for pain management, send prescription to [facility]  Non-small cell lung cancer Weyman Pedro was initially diagnosed with stage IIIA non-small cell lung cancer in September 2006. He underwent a left pneumonectomy and received chemotherapy. The current concern is the right lung mass, which is now more solid. The PET scan will help determine if there  is any recurrence or progression of the disease. - Review PET scan results once available  Renal cell carcinoma Weyman Pedro underwent nephroureterectomy in 2022 for renal cell carcinoma and has been on surveillance since then. There is no current indication of recurrence or new symptoms related to this condition. - Continue surveillance for renal cell carcinoma  Weight loss Zack Crager has intentionally lost 50 pounds through a weight program with the [place], reducing his weight from 238 pounds to 189.4 pounds. This weight loss is intentional and part of a health improvement plan. - Continue current weight management program     2) history of bladder cancer: Status post resection at [facility].he is currently followed by Dr.Tsivinn at [facility].  The recent biopsy confirmed a high-grade urothelial carcinoma.  He underwent right nephroureterectomy at [facility] on July 12, 2020 followed by urology at that  facility.  He is currently on observation.  3) for the recently diagnosed pulmonary embolism and atrial fibrillation, the patient developed new left external iliac/femoral vein thrombus during his visit at [facility].  He was of anticoagulation at that time.  He is current on treatment with Eliquis and tolerating it fairly well. The patient was advised to call immediately if he has any concerning symptoms in the interval.  The patient voices understanding of current disease status and treatment options and is in agreement with the current care plan.  All questions were answered. The patient knows to call the clinic with any problems, questions or concerns. We can certainly see the patient much sooner if necessary.  Disclaimer: This note was dictated with voice recognition software. Similar sounding words can inadvertently be transcribed and may be missed upon review.

## 2023-04-04 ENCOUNTER — Encounter: Payer: Self-pay | Admitting: Family Medicine

## 2023-04-04 ENCOUNTER — Telehealth: Payer: Self-pay | Admitting: Internal Medicine

## 2023-04-04 NOTE — Telephone Encounter (Signed)
 Patient is aware of the scheduled appointment details.

## 2023-04-10 ENCOUNTER — Ambulatory Visit (HOSPITAL_COMMUNITY)

## 2023-04-14 ENCOUNTER — Ambulatory Visit (HOSPITAL_COMMUNITY)
Admission: RE | Admit: 2023-04-14 | Discharge: 2023-04-14 | Disposition: A | Discharge status: Home / Self Care | Source: Ambulatory Visit | Attending: Internal Medicine | Admitting: Internal Medicine

## 2023-04-14 DIAGNOSIS — C3490 Malignant neoplasm of unspecified part of unspecified bronchus or lung: Secondary | ICD-10-CM | POA: Insufficient documentation

## 2023-04-14 LAB — GLUCOSE, CAPILLARY: Glucose-Capillary: 119 mg/dL — ABNORMAL HIGH (ref 70–99)

## 2023-04-14 MED ORDER — FLUDEOXYGLUCOSE F - 18 (FDG) INJECTION
9.2300 | Freq: Once | INTRAVENOUS | Status: AC
Start: 2023-04-14 — End: 2023-04-14
  Administered 2023-04-14: 9.23 via INTRAVENOUS

## 2023-04-14 MED ORDER — HEPARIN SOD (PORK) LOCK FLUSH 100 UNIT/ML IV SOLN
500.0000 [IU] | Freq: Once | INTRAVENOUS | Status: AC
  Administered 2023-04-14: 500 [IU] via INTRAVENOUS
  Filled 2023-04-14: qty 5

## 2023-04-18 ENCOUNTER — Telehealth: Payer: Self-pay | Admitting: Medical Oncology

## 2023-04-18 ENCOUNTER — Other Ambulatory Visit: Payer: Self-pay | Admitting: Internal Medicine

## 2023-04-18 MED ORDER — OXYCODONE-ACETAMINOPHEN 5-325 MG PO TABS: 1.0000 | ORAL_TABLET | Freq: Three times a day (TID) | ORAL | 0 refills | Status: DC | PRN

## 2023-04-18 NOTE — Telephone Encounter (Signed)
 Pt requested to r/s port flush . Flush appt cancelled.  Will be r/s after pt sees Arbutus Ped this month to go over PET scan.

## 2023-04-18 NOTE — Telephone Encounter (Signed)
 Refill for Oxycodone requested.

## 2023-04-27 ENCOUNTER — Inpatient Hospital Stay: Attending: Internal Medicine | Admitting: Internal Medicine

## 2023-04-27 VITALS — BP 139/92 | HR 79 | Temp 97.9°F | Resp 17 | Wt 199.8 lb

## 2023-04-27 DIAGNOSIS — Z452 Encounter for adjustment and management of vascular access device: Secondary | ICD-10-CM | POA: Insufficient documentation

## 2023-04-27 DIAGNOSIS — Z85118 Personal history of other malignant neoplasm of bronchus and lung: Secondary | ICD-10-CM | POA: Insufficient documentation

## 2023-04-27 DIAGNOSIS — I10 Essential (primary) hypertension: Secondary | ICD-10-CM | POA: Diagnosis not present

## 2023-04-27 DIAGNOSIS — Z905 Acquired absence of kidney: Secondary | ICD-10-CM | POA: Diagnosis not present

## 2023-04-27 DIAGNOSIS — Z8551 Personal history of malignant neoplasm of bladder: Secondary | ICD-10-CM | POA: Diagnosis not present

## 2023-04-27 DIAGNOSIS — Z79899 Other long term (current) drug therapy: Secondary | ICD-10-CM | POA: Insufficient documentation

## 2023-04-27 DIAGNOSIS — Z7901 Long term (current) use of anticoagulants: Secondary | ICD-10-CM | POA: Insufficient documentation

## 2023-04-27 DIAGNOSIS — Z885 Allergy status to narcotic agent status: Secondary | ICD-10-CM | POA: Insufficient documentation

## 2023-04-27 DIAGNOSIS — C3490 Malignant neoplasm of unspecified part of unspecified bronchus or lung: Secondary | ICD-10-CM

## 2023-04-27 DIAGNOSIS — Z86711 Personal history of pulmonary embolism: Secondary | ICD-10-CM | POA: Diagnosis not present

## 2023-04-27 DIAGNOSIS — C3431 Malignant neoplasm of lower lobe, right bronchus or lung: Secondary | ICD-10-CM | POA: Insufficient documentation

## 2023-04-27 DIAGNOSIS — Z79890 Hormone replacement therapy (postmenopausal): Secondary | ICD-10-CM | POA: Insufficient documentation

## 2023-04-27 DIAGNOSIS — N281 Cyst of kidney, acquired: Secondary | ICD-10-CM | POA: Diagnosis not present

## 2023-04-27 NOTE — Progress Notes (Signed)
 [facility] [phone]   [phone]  OFFICE PROGRESS NOTE  [facility] [address] [place] [address]  PRINCIPAL DIAGNOSIS:  1) new right lower lobe pulmonary nodule suspicious for metachronous lung cancer. 2) Recurrent non-small cell lung cancer initially diagnosed as stage IIIA September 2006.  2) diagnosis of early stage bladder cancer in 2014: Status post resection followed by 6 months of intravesical BCG. 3) He has recurrent hematuria. Repeat cystoscopy and biopsy 01/16/2013: CIS, normal RPGs. CT scan abdomen: bilateral renal cysts. No adenopathy. Cystoscopy and biopsy December 2015: Positive cytologies 4) recurrent non-small cell lung cancer, adenocarcinoma involving the left lower lobe diagnosed in November 2015.  PRIOR THERAPY:  Status post 3 cycles of neoadjuvant chemotherapy with carboplatin and docetaxel, last dose was given November 22, 2004. Status post left upper lobectomy with lymph node dissection under the care of Dr. Edwyna Shell on January 11, 2005. Status post pericardial window on February 02, 2005 for evacuation of postoperative pericardial tamponade and the fluid was negative for malignancy. Status post 3 cycles of adjuvant chemotherapy with carboplatin and gemcitabine. Last dose was given May 13, 2005. Status post 5 cycles of systemic chemotherapy with carboplatin, paclitaxel and Avastin for disease recurrence. Last dose was given January 04, 2006 and the patient had stable disease by the end of the last cycle. Status post maintenance treatment with Avastin 15 mg/kg given every 3 weeks. The patient is status post 42 cycles, discontinued on July 02, 2008 after the patient had stable disease for more than 2 years. Status post Bilateral selective ureteral cytology, Bilateral retrograde pyelography, Transurethral resection of bladder tumor, Random bladder biopsies, Prostatic urethral biopsy and Transrectal biopsy of the  prostate under the care of Dr. Fontaine No at [facility] Status post redo thoracotomy with left pneumonectomy under the care of Dr. Armandina Stammer at [facility] on 03/05/2014. Status post right nephroureterectomy at [facility] on July 13, 2020  CURRENT THERAPY: Eliquis 5 mg p.o. twice daily.  INTERVAL HISTORY: Wesley Harmon 78 y.o. male returns to the clinic today for follow-up visit accompanied by his daughter Wesley Harmon. Discussed the use of AI scribe software for clinical note transcription with the patient, who gave verbal consent to proceed.  History of Present Illness   Wesley Harmon is a 78 year old male with recurrent non-small cell lung cancer who presents for evaluation of a suspicious nodule in the right lung. He is accompanied by his daughter, Wesley Harmon.  He has a history of recurrent non-small cell lung cancer, initially diagnosed as stage IIIA in September 2006. Treatment included neoadjuvant systemic chemotherapy followed by a left lobectomy and later a completion pneumonectomy for recurrent disease. He has undergone several chemotherapy regimens and is currently on observation.  A previous CT scan at an outside facility revealed a suspicious nodule in the right lung. A recent PET scan showed resolution of the nodule, but there are still lymph nodes in the mediastinum that could be reactive or indicative of cancer.  He experiences pain radiating from the right lateral rib to his back, exacerbated by certain movements. It was suggested that this could be due to a post-traumatic rib fracture, which he was unaware of.  He has a history of pulmonary embolism and is currently managed with Eliquis. He is no longer taking Lovenox.       MEDICAL HISTORY: Past Medical History:  Diagnosis Date   Bladder cancer (HCC) 01/02/13   GERD (gastroesophageal  reflux disease)    Hyperlipemia    Hypertension    lung ca dx'd 07/2004   chemo comp 06/2008   Lung cancer (HCC)     Neuropathy    Presence of urostomy (HCC)     ALLERGIES:  is allergic to dilaudid [hydromorphone hcl] and gemcitabine.  MEDICATIONS:  Current Outpatient Medications  Medication Sig Dispense Refill   Albuterol Sulfate 108 (90 Base) MCG/ACT AEPB Inhale 2 puffs into the lungs every 6 (six) hours as needed. (Patient not taking: Reported on 04/06/2021)     apixaban (ELIQUIS) 5 MG TABS tablet Take 5 mg by mouth 2 (two) times daily.     Ascorbic Acid (VITAMIN C) 1000 MG tablet Take 1,000 mg by mouth every morning.     atorvastatin (LIPITOR) 40 MG tablet Take 40 mg by mouth at bedtime.     Cholecalciferol (VITAMIN D) 50 MCG (2000 UT) tablet Take 2,000 Units by mouth every morning.     enoxaparin (LOVENOX) 100 MG/ML injection SMARTSIG:100 SUB-Q Daily (Patient not taking: Reported on 08/30/2021)     gabapentin (NEURONTIN) 300 MG capsule Take 300 mg by mouth 2 (two) times daily.     levothyroxine (SYNTHROID) 50 MCG tablet Take 50 mcg by mouth daily before breakfast.     lisinopril (ZESTRIL) 5 MG tablet Take 1 tablet by mouth daily.     Multiple Vitamins-Minerals (MULTIVITAMIN WITH MINERALS) tablet Take 1 tablet by mouth every morning.     oxyCODONE-acetaminophen (PERCOCET/ROXICET) 5-325 MG tablet Take 1 tablet by mouth every 8 (eight) hours as needed for severe pain (pain score 7-10). 30 tablet 0   pantoprazole (PROTONIX) 40 MG tablet Take 1 tablet (40 mg total) by mouth daily. 30 tablet 0   Polyethylene Glycol 3350 (PEG 3350) 17 GM/SCOOP POWD Take 17 g by mouth daily.     primidone (MYSOLINE) 50 MG tablet Take 150 mg by mouth at bedtime.     No current facility-administered medications for this visit.    REVIEW OF SYSTEMS:  Constitutional: negative Eyes: negative Ears, nose, mouth, throat, and face: negative Respiratory: positive for pleurisy/chest pain Cardiovascular: negative Gastrointestinal: negative Genitourinary:negative Integument/breast: negative Hematologic/lymphatic:  negative Musculoskeletal:negative Neurological: negative Behavioral/Psych: negative Endocrine: negative Allergic/Immunologic: negative   PHYSICAL EXAMINATION: General appearance: alert, cooperative, fatigued, and no distress Head: Normocephalic, without obvious abnormality, atraumatic Neck: no adenopathy Lymph nodes: Cervical, supraclavicular, and axillary nodes normal. Resp: Clear to auscultation on the right and absent breath sound on the left Back: symmetric, no curvature. ROM normal. No CVA tenderness. Cardio: regular rate and rhythm, S1, S2 normal, no murmur, click, rub or gallop GI: soft, non-tender; bowel sounds normal; no masses,  no organomegaly Extremities: extremities normal, atraumatic, no cyanosis or edema Neurologic: Alert and oriented X 3, normal strength and tone. Normal symmetric reflexes. Normal coordination and gait  ECOG PERFORMANCE STATUS: 1 - Symptomatic but completely ambulatory  Blood pressure (!) 139/92, pulse 79, temperature 97.9 F (36.6 C), temperature source Temporal, resp. rate 17, weight 199 lb 12.8 oz (90.6 kg), SpO2 97%.  LABORATORY DATA: Lab Results  Component Value Date   WBC 5.2 03/08/2023   HGB 11.7 (L) 03/08/2023   HCT 35.4 (L) 03/08/2023   MCV 99.2 03/08/2023   PLT 153 03/08/2023      Chemistry      Component Value Date/Time   NA 137 03/08/2023 0949   NA 138 08/24/2016 1501   K 4.7 03/08/2023 0949   K 4.0 08/24/2016 1501   CL 107 03/08/2023 0949  CL 102 09/30/2011 0843   CO2 25 03/08/2023 0949   CO2 26 08/24/2016 1501   BUN 36 (H) 03/08/2023 0949   BUN 23.0 08/24/2016 1501   CREATININE 1.42 (H) 03/08/2023 0949   CREATININE 1.1 08/24/2016 1501      Component Value Date/Time   CALCIUM 9.0 03/08/2023 0949   CALCIUM 9.3 08/24/2016 1501   ALKPHOS 94 03/08/2023 0949   ALKPHOS 111 08/24/2016 1501   AST 11 (L) 03/08/2023 0949   AST 19 08/24/2016 1501   ALT 7 03/08/2023 0949   ALT 16 08/24/2016 1501   BILITOT 0.3 03/08/2023 0949    BILITOT 0.72 08/24/2016 1501       RADIOGRAPHIC STUDIES: NM PET Image Restage (PS) Skull Base to Thigh (F-18 FDG) Result Date: 04/27/2023 CLINICAL DATA:  Subsequent treatment strategy for non-small cell lung cancer. EXAM: NUCLEAR MEDICINE PET SKULL BASE TO THIGH TECHNIQUE: 9.2 mCi F-18 FDG was injected intravenously. Full-ring PET imaging was performed from the skull base to thigh after the radiotracer. CT data was obtained and used for attenuation correction and anatomic localization. Fasting blood glucose: 119 mg/dl COMPARISON:  Outside CT chest 03/17/2023 FINDINGS: NECK: Significant head motion with misregistration between the PET data set CT data set. No hypermetabolic cervical lymph nodes identified. Physiologic activity noted in the vocal cords. Incidental CT findings: None. CHEST: Volume loss in the LEFT hemithorax consistent with LEFT lobectomy. No focal activity in the LEFT hemithorax to suggest lung cancer recurrence. Within the mediastinum, hypermetabolic precarinal node with SUV max equal 6.5 on image 64. This node measures 9 mm short axis on image 64/4. Focal metabolic activity in a RIGHT lower paratracheal node with SUV max equal 5.2 on image 68. LEFT peribronchial node deep to the LEFT mainstem bronchus with SUV max equal 5.2 on image 70. Within the RIGHT lung there is interval near complete resolution of the RIGHT lower lobe consolidation seen on comparison exam. Subtle flattened lesion remains measuring 2.6 by 2.5 cm decreased from 3.3 x 2.6 cm. This residual consolidation has no metabolic activity. Incidental CT findings: None. ABDOMEN/PELVIS: No evidence of liver metastasis. Adrenal glands are normal. Incidental CT findings: Post RIGHT nephrectomy. Ileal conduit anatomy. No metastatic lymphadenopathy. SKELETON: A single focus of metabolic activity along the RIGHT lateral chest wall localizing to a rib on image 66. No clear CT correlation. Incidental CT findings: None. IMPRESSION: 1.  Interval improvement in RIGHT lobe consolidation. No metabolic activity. Findings consistent resolving pulmonary infection. 2. Hypermetabolic mediastinal lymph nodes. Differential include metastatic adenopathy versus reactive adenopathy. 3. No evidence of recurrence in the LEFT hemithorax following lobectomy. 4. No evidence of metastatic disease in the abdomen pelvis. 5. Single focus of metabolic activity associated with a RIGHT lateral rib. Favor posttraumatic activity. Electronically Signed   By: Genevive Bi M.D.   On: 04/27/2023 10:07    ASSESSMENT AND PLAN: This is a very pleasant 78 years old white male with:  1)  recurrent non-small cell lung cancer status post several treatment regimen including neoadjuvant chemotherapy followed by left upper lobectomy followed by systemic chemotherapy as well as maintenance treatment with Avastin and has been observation since June of 2010 and with no evidence for disease recurrence until November 2015 when he was found to have hypermetabolic activity in the left lower lobe.. The patient underwent redo thoracotomy with complete left pneumonectomy His imaging studies including CT scan of the chest as well as a PET scan in August 2021 showed a suspicious hypermetabolic nodule in the  right lung.  The patient underwent CT-guided biopsy of this lesion but it was negative for malignancy.  Unfortunately further imaging studies scan showed increase in the size of the irregular solid 2.0 cm superior right lower lobe pulmonary nodule suspicious for metachronous primary bronchogenic carcinoma versus metastasis. This was highly suspicious for malignancy even with the negative previous biopsy. He underwent SBRT to this lesion under the care of Dr. Mitzi Hansen.   The patient is currently on observation and he is feeling fine.  He had a PET scan performed recently for initial suspicious right lung nodule.  The nodule had resolved in the interval but there was enlarged mediastinal  lymph nodes suspicious to be metastatic versus reactive in nature.    Recurrent non-small cell lung cancer Stage IIIA non-small cell lung cancer treated with neoadjuvant chemotherapy, lobectomy, and completion pneumonectomy. Currently no evidence of recurrent disease.  Right lung nodule Previously identified suspicious nodule in the right lung has resolved on recent PET scan. No evidence of recurrent disease in the left hemithorax or metastatic disease in the abdomen or pelvis.  Mediastinal lymphadenopathy Persistent lymphadenopathy in the mediastinum, likely reactive but metastatic adenopathy cannot be ruled out. No current evidence to confirm malignancy. - Schedule CT scan in 3 months to reassess lymph nodes - Advise him to report symptoms such as hemoptysis or other concerning signs  Pulmonary embolism On anticoagulation therapy with Eliquis. Discontinued Lovenox injections as per his report. - Continue Eliquis - Discontinue Lovenox injections  Bladder cancer Currently under observation. No active issues discussed during this visit.  Right rib fracture Right lateral rib fracture likely post-traumatic, possibly causing current pain. No other significant findings on imaging. - Recommend pain management with acetaminophen as needed     2) history of bladder cancer: Status post resection at [facility]he is currently followed by Dr.Tsivinn at [facility].  The recent biopsy confirmed a high-grade urothelial carcinoma.  He underwent right nephroureterectomy at [facility] on July 12, 2020 followed by urology at that facility.  He is currently on observation.  3) for the recently diagnosed pulmonary embolism and atrial fibrillation, the patient developed new left external iliac/femoral vein thrombus during his visit at [facility].  He was of anticoagulation at that time.  He is current on treatment with Eliquis and tolerating it fairly well. The patient was  advised to call immediately if he has any other concerning symptoms in the interval.  The patient voices understanding of current disease status and treatment options and is in agreement with the current care plan.  All questions were answered. The patient knows to call the clinic with any problems, questions or concerns. We can certainly see the patient much sooner if necessary.  Disclaimer: This note was dictated with voice recognition software. Similar sounding words can inadvertently be transcribed and may be missed upon review.

## 2023-04-28 ENCOUNTER — Telehealth: Payer: Self-pay | Admitting: Internal Medicine

## 2023-04-28 NOTE — Telephone Encounter (Signed)
Left a voicemail with appointment details

## 2023-05-01 ENCOUNTER — Other Ambulatory Visit: Payer: Self-pay | Admitting: Internal Medicine

## 2023-05-01 ENCOUNTER — Telehealth: Payer: Self-pay | Admitting: Medical Oncology

## 2023-05-01 MED ORDER — OXYCODONE-ACETAMINOPHEN 5-325 MG PO TABS: 1.0000 | ORAL_TABLET | Freq: Three times a day (TID) | ORAL | 0 refills | Status: AC | PRN

## 2023-05-01 NOTE — Telephone Encounter (Signed)
 Refill requested for oxycodone for pain under right breast.

## 2023-05-03 ENCOUNTER — Inpatient Hospital Stay: Payer: Medicare Other

## 2023-05-05 ENCOUNTER — Inpatient Hospital Stay

## 2023-05-05 DIAGNOSIS — Z95828 Presence of other vascular implants and grafts: Secondary | ICD-10-CM

## 2023-05-05 DIAGNOSIS — C3431 Malignant neoplasm of lower lobe, right bronchus or lung: Secondary | ICD-10-CM | POA: Diagnosis not present

## 2023-05-05 DIAGNOSIS — C3432 Malignant neoplasm of lower lobe, left bronchus or lung: Secondary | ICD-10-CM

## 2023-05-05 MED ORDER — SODIUM CHLORIDE 0.9% FLUSH
10.0000 mL | Freq: Once | INTRAVENOUS | Status: AC
  Administered 2023-05-05: 10 mL

## 2023-05-05 MED ORDER — HEPARIN SOD (PORK) LOCK FLUSH 100 UNIT/ML IV SOLN
500.0000 [IU] | Freq: Once | INTRAVENOUS | Status: AC
  Administered 2023-05-05: 500 [IU]

## 2023-06-07 ENCOUNTER — Inpatient Hospital Stay: Payer: Non-veteran care

## 2023-06-07 ENCOUNTER — Inpatient Hospital Stay: Payer: Medicare Other | Admitting: Physician Assistant

## 2023-06-16 ENCOUNTER — Inpatient Hospital Stay: Attending: Internal Medicine

## 2023-06-16 DIAGNOSIS — C3432 Malignant neoplasm of lower lobe, left bronchus or lung: Secondary | ICD-10-CM

## 2023-06-16 DIAGNOSIS — Z95828 Presence of other vascular implants and grafts: Secondary | ICD-10-CM

## 2023-06-16 DIAGNOSIS — C3431 Malignant neoplasm of lower lobe, right bronchus or lung: Secondary | ICD-10-CM | POA: Diagnosis present

## 2023-06-16 MED ORDER — SODIUM CHLORIDE 0.9% FLUSH
10.0000 mL | Freq: Once | INTRAVENOUS | Status: AC
  Administered 2023-06-16: 10 mL

## 2023-06-16 MED ORDER — HEPARIN SOD (PORK) LOCK FLUSH 100 UNIT/ML IV SOLN
500.0000 [IU] | Freq: Once | INTRAVENOUS | Status: AC
  Administered 2023-06-16: 500 [IU]

## 2023-07-13 ENCOUNTER — Inpatient Hospital Stay: Attending: Internal Medicine

## 2023-07-13 ENCOUNTER — Ambulatory Visit (HOSPITAL_COMMUNITY)
Admission: RE | Admit: 2023-07-13 | Discharge: 2023-07-13 | Disposition: A | Discharge status: Home / Self Care | Source: Ambulatory Visit | Attending: Internal Medicine | Admitting: Internal Medicine

## 2023-07-13 ENCOUNTER — Inpatient Hospital Stay

## 2023-07-13 DIAGNOSIS — Z79899 Other long term (current) drug therapy: Secondary | ICD-10-CM | POA: Insufficient documentation

## 2023-07-13 DIAGNOSIS — C3490 Malignant neoplasm of unspecified part of unspecified bronchus or lung: Secondary | ICD-10-CM | POA: Insufficient documentation

## 2023-07-13 DIAGNOSIS — I700 Atherosclerosis of aorta: Secondary | ICD-10-CM | POA: Diagnosis not present

## 2023-07-13 DIAGNOSIS — C3431 Malignant neoplasm of lower lobe, right bronchus or lung: Secondary | ICD-10-CM | POA: Diagnosis present

## 2023-07-13 DIAGNOSIS — C3432 Malignant neoplasm of lower lobe, left bronchus or lung: Secondary | ICD-10-CM

## 2023-07-13 DIAGNOSIS — K435 Parastomal hernia without obstruction or  gangrene: Secondary | ICD-10-CM | POA: Insufficient documentation

## 2023-07-13 DIAGNOSIS — J439 Emphysema, unspecified: Secondary | ICD-10-CM | POA: Diagnosis not present

## 2023-07-13 DIAGNOSIS — Z95828 Presence of other vascular implants and grafts: Secondary | ICD-10-CM

## 2023-07-13 LAB — CMP (CANCER CENTER ONLY)
ALT: 9 U/L (ref 0–44)
AST: 13 U/L — ABNORMAL LOW (ref 15–41)
Albumin: 4 g/dL (ref 3.5–5.0)
Alkaline Phosphatase: 95 U/L (ref 38–126)
Anion gap: 5 (ref 5–15)
BUN: 40 mg/dL — ABNORMAL HIGH (ref 8–23)
CO2: 25 mmol/L (ref 22–32)
Calcium: 8.7 mg/dL — ABNORMAL LOW (ref 8.9–10.3)
Chloride: 109 mmol/L (ref 98–111)
Creatinine: 1.51 mg/dL — ABNORMAL HIGH (ref 0.61–1.24)
GFR, Estimated: 46 mL/min — ABNORMAL LOW (ref >60)
Glucose, Bld: 85 mg/dL (ref 70–99)
Potassium: 5.1 mmol/L (ref 3.5–5.1)
Sodium: 139 mmol/L (ref 135–145)
Total Bilirubin: 0.3 mg/dL (ref 0.0–1.2)
Total Protein: 6.7 g/dL (ref 6.5–8.1)

## 2023-07-13 LAB — CBC WITH DIFFERENTIAL (CANCER CENTER ONLY)
Abs Immature Granulocytes: 0.02 10*3/uL (ref 0.00–0.07)
Basophils Absolute: 0.1 10*3/uL (ref 0.0–0.1)
Basophils Relative: 1 %
Eosinophils Absolute: 0.2 10*3/uL (ref 0.0–0.5)
Eosinophils Relative: 5 %
HCT: 32.7 % — ABNORMAL LOW (ref 39.0–52.0)
Hemoglobin: 11.2 g/dL — ABNORMAL LOW (ref 13.0–17.0)
Immature Granulocytes: 0 %
Lymphocytes Relative: 23 %
Lymphs Abs: 1.1 10*3/uL (ref 0.7–4.0)
MCH: 32.3 pg (ref 26.0–34.0)
MCHC: 34.3 g/dL (ref 30.0–36.0)
MCV: 94.2 fL (ref 80.0–100.0)
Monocytes Absolute: 0.5 10*3/uL (ref 0.1–1.0)
Monocytes Relative: 11 %
Neutro Abs: 2.8 10*3/uL (ref 1.7–7.7)
Neutrophils Relative %: 60 %
Platelet Count: 129 10*3/uL — ABNORMAL LOW (ref 150–400)
RBC: 3.47 MIL/uL — ABNORMAL LOW (ref 4.22–5.81)
RDW: 14.1 % (ref 11.5–15.5)
WBC Count: 4.7 10*3/uL (ref 4.0–10.5)
nRBC: 0 % (ref 0.0–0.2)

## 2023-07-13 MED ORDER — SODIUM CHLORIDE 0.9% FLUSH
10.0000 mL | Freq: Once | INTRAVENOUS | Status: AC
  Administered 2023-07-13: 10 mL

## 2023-07-13 MED ORDER — HEPARIN SOD (PORK) LOCK FLUSH 100 UNIT/ML IV SOLN
500.0000 [IU] | Freq: Once | INTRAVENOUS | Status: AC
  Administered 2023-07-13: 500 [IU]

## 2023-07-18 ENCOUNTER — Inpatient Hospital Stay

## 2023-07-18 ENCOUNTER — Ambulatory Visit (HOSPITAL_COMMUNITY)

## 2023-07-25 ENCOUNTER — Telehealth: Payer: Self-pay | Admitting: Internal Medicine

## 2023-07-25 ENCOUNTER — Telehealth: Payer: Self-pay | Admitting: Medical Oncology

## 2023-07-25 NOTE — Telephone Encounter (Signed)
 Mychart message sent with new appointment details.

## 2023-07-25 NOTE — Telephone Encounter (Signed)
 Attempted to inform the patient of rescheduled appointment details. The home phone is an invalid number and the voicemail is full on the [employer] number. Will try again later.

## 2023-07-27 ENCOUNTER — Inpatient Hospital Stay: Admitting: Internal Medicine

## 2023-07-28 ENCOUNTER — Inpatient Hospital Stay

## 2023-08-04 NOTE — Progress Notes (Unsigned)
 [facility] OFFICE PROGRESS NOTE  [facility] [address] [place] [id number]  DIAGNOSIS:  1) new right lower lobe pulmonary nodule suspicious for metachronous lung cancer. 2) Recurrent non-small cell lung cancer initially diagnosed as stage IIIA September 2006.  2) diagnosis of early stage bladder cancer in 2014: Status post resection followed by 6 months of intravesical BCG. 3) He has recurrent hematuria. Repeat cystoscopy and biopsy 01/16/2013: CIS, normal RPGs. CT scan abdomen: bilateral renal cysts. No adenopathy. Cystoscopy and biopsy December 2015: Positive cytologies 4) recurrent non-small cell lung cancer, adenocarcinoma involving the left lower lobe diagnosed in November 2015.    PRIOR THERAPY: Status post 3 cycles of neoadjuvant chemotherapy with carboplatin and docetaxel, last dose was given November 22, 2004. Status post left upper lobectomy with lymph node dissection under the care of Dr. Brantley on January 11, 2005. Status post pericardial window on February 02, 2005 for evacuation of postoperative pericardial tamponade and the fluid was negative for malignancy. Status post 3 cycles of adjuvant chemotherapy with carboplatin and gemcitabine. Last dose was given May 13, 2005. Status post 5 cycles of systemic chemotherapy with carboplatin, paclitaxel and Avastin for disease recurrence. Last dose was given January 04, 2006 and the patient had stable disease by the end of the last cycle. Status post maintenance treatment with Avastin 15 mg/kg given every 3 weeks. The patient is status post 42 cycles, discontinued on July 02, 2008 after the patient had stable disease for more than 2 years. Status post Bilateral selective ureteral cytology, Bilateral retrograde pyelography, Transurethral resection of bladder tumor, Random bladder biopsies, Prostatic urethral biopsy and Transrectal biopsy of the prostate under the care of Dr. Rodgers Marina at [facility]. Status post redo thoracotomy with left pneumonectomy under the care of Dr. Jannell at [facility] on 03/05/2014. Status post right nephroureterectomy at [facility] on July 13, 2020  CURRENT THERAPY: Eliquis 5 mg p.o. twice daily.   INTERVAL HISTORY: Wesley Harmon 78 y.o. male returns to the clinic today for a follow up visit. The patient was last seen by Dr. Sherrod on 04/27/23. He denies any major changes in his health since last being seen. He is followed for his history of lung cancer and bladder cancer. He is also on blood thinner for his history of pulmonary embolism and atrial fibrillation. He is feeling well today. Denies any fever, chills, or night sweats. He lost 50 lbs through a weight program at the [facility] and has modified his diet. He has dyspnea on exertion for which he sometimes uses supplemental oxygen as needed. His dyspnea on exertion is stable. Denies any chest pain, cough, or hemoptysis. Denies any nausea, vomiting, or diarrhea. He sometimes has constipation and uses miralax . Denies any headaches. He recently had a restaging CT scan performed. The patient is here today for evaluation and to review his scan results.   MEDICAL HISTORY: Past Medical History:  Diagnosis Date   Bladder cancer (HCC) 01/02/13   GERD (gastroesophageal reflux disease)    Hyperlipemia    Hypertension    lung ca dx'd 07/2004   chemo comp 06/2008   Lung cancer (HCC)    Neuropathy    Presence of urostomy (HCC)     ALLERGIES:  is allergic to dilaudid [hydromorphone hcl] and gemcitabine.  MEDICATIONS:  Current Outpatient Medications  Medication Sig Dispense Refill   acetaminophen  (TYLENOL ) 500 MG tablet Take 1,000 mg by mouth every 6 (six) hours as needed.  Albuterol  Sulfate 108 (90 Base) MCG/ACT AEPB Inhale 2 puffs into the lungs every 6 (six) hours as needed.     apixaban (ELIQUIS) 5 MG TABS tablet Take 5 mg by mouth 2 (two) times daily.     Ascorbic Acid (VITAMIN C)  1000 MG tablet Take 1,000 mg by mouth every morning.     atorvastatin  (LIPITOR) 40 MG tablet Take 40 mg by mouth at bedtime.     Cholecalciferol (VITAMIN D) 50 MCG (2000 UT) tablet Take 2,000 Units by mouth every morning.     gabapentin  (NEURONTIN ) 300 MG capsule Take 300 mg by mouth 2 (two) times daily.     levothyroxine  (SYNTHROID ) 50 MCG tablet Take 50 mcg by mouth daily before breakfast.     lisinopril  (ZESTRIL ) 5 MG tablet Take 1 tablet by mouth daily.     Multiple Vitamins-Minerals (MULTIVITAMIN WITH MINERALS) tablet Take 1 tablet by mouth every morning.     oxyCODONE -acetaminophen  (PERCOCET/ROXICET) 5-325 MG tablet Take 1 tablet by mouth every 8 (eight) hours as needed for severe pain (pain score 7-10). 30 tablet 0   pantoprazole  (PROTONIX ) 40 MG tablet Take 1 tablet (40 mg total) by mouth daily. 30 tablet 0   Polyethylene Glycol 3350  (PEG 3350 ) 17 GM/SCOOP POWD Take 17 g by mouth daily.     primidone  (MYSOLINE ) 50 MG tablet Take 150 mg by mouth at bedtime.     No current facility-administered medications for this visit.    SURGICAL HISTORY:  Past Surgical History:  Procedure Laterality Date   arm surgery     CARDIAC SURGERY     CHOLECYSTECTOMY     HERNIA REPAIR     NEPHRECTOMY Right    PNEUMONECTOMY Left     REVIEW OF SYSTEMS:   Review of Systems  Constitutional: Negative for appetite change, chills, fatigue, fever and unexpected weight change.  HENT: Negative for mouth sores, nosebleeds, sore throat and trouble swallowing.   Eyes: Negative for eye problems and icterus.  Respiratory: Negative for cough, hemoptysis, shortness of breath and wheezing.   Cardiovascular: Negative for chest pain and leg swelling.  Gastrointestinal: Positive for constipation. Negative for abdominal pain, diarrhea, nausea and vomiting.  Genitourinary: Negative for bladder incontinence, difficulty urinating, dysuria, frequency and hematuria.   Musculoskeletal: Negative for back pain, gait problem,  neck pain and neck stiffness.  Skin: Negative for itching and rash.  Neurological: Negative for dizziness, extremity weakness, gait problem, headaches, light-headedness and seizures.  Hematological: Negative for adenopathy. Does not bruise/bleed easily.  Psychiatric/Behavioral: Negative for confusion, depression and sleep disturbance. The patient is not nervous/anxious.     PHYSICAL EXAMINATION:  Blood pressure 122/77, pulse 81, temperature (!) 97.5 F (36.4 C), temperature source Tympanic, resp. rate 18, height 5' 9 (1.753 m), weight 201 lb 3.2 oz (91.3 kg), SpO2 98%.  ECOG PERFORMANCE STATUS: 1  Physical Exam  Constitutional: Oriented to person, place, and time and well-developed, well-nourished, and in no distress.  HENT:  Head: Normocephalic and atraumatic.  Mouth/Throat: Oropharynx is clear and moist. No oropharyngeal exudate.  Eyes: Conjunctivae are normal. Right eye exhibits no discharge. Left eye exhibits no discharge. No scleral icterus.  Neck: Normal range of motion. Neck supple.  Cardiovascular: Normal rate, regular rhythm, normal heart sounds and intact distal pulses.   Pulmonary/Chest: Effort normal. Absent breath sounds in left lung. No respiratory distress. No wheezes. No rales.  Abdominal: Soft. Bowel sounds are normal. Exhibits no distension and no mass. There is no tenderness.  Musculoskeletal: Normal  range of motion. Exhibits no edema.  Lymphadenopathy:    No cervical adenopathy.  Neurological: Alert and oriented to person, place, and time. Exhibits normal muscle tone. Gait normal. Coordination normal.  Skin: Skin is warm and dry. No rash noted. Not diaphoretic. No erythema. No pallor.  Psychiatric: Mood, memory and judgment normal.  Vitals reviewed.  LABORATORY DATA: Lab Results  Component Value Date   WBC 4.7 07/13/2023   HGB 11.2 (L) 07/13/2023   HCT 32.7 (L) 07/13/2023   MCV 94.2 07/13/2023   PLT 129 (L) 07/13/2023      Chemistry      Component Value  Date/Time   NA 139 07/13/2023 1346   NA 138 08/24/2016 1501   K 5.1 07/13/2023 1346   K 4.0 08/24/2016 1501   CL 109 07/13/2023 1346   CL 102 09/30/2011 0843   CO2 25 07/13/2023 1346   CO2 26 08/24/2016 1501   BUN 40 (H) 07/13/2023 1346   BUN 23.0 08/24/2016 1501   CREATININE 1.51 (H) 07/13/2023 1346   CREATININE 1.1 08/24/2016 1501      Component Value Date/Time   CALCIUM  8.7 (L) 07/13/2023 1346   CALCIUM  9.3 08/24/2016 1501   ALKPHOS 95 07/13/2023 1346   ALKPHOS 111 08/24/2016 1501   AST 13 (L) 07/13/2023 1346   AST 19 08/24/2016 1501   ALT 9 07/13/2023 1346   ALT 16 08/24/2016 1501   BILITOT 0.3 07/13/2023 1346   BILITOT 0.72 08/24/2016 1501       RADIOGRAPHIC STUDIES:  CT Chest Wo Contrast Result Date: 07/13/2023 CLINICAL DATA:  Non-small cell lung cancer staging. EXAM: CT CHEST, ABDOMEN AND PELVIS WITHOUT CONTRAST TECHNIQUE: Multidetector CT imaging of the chest, abdomen and pelvis was performed following the standard protocol without IV contrast. RADIATION DOSE REDUCTION: This exam was performed according to the departmental dose-optimization program which includes automated exposure control, adjustment of the mA and/or kV according to patient size and/or use of iterative reconstruction technique. COMPARISON:  PET CT dated 04/14/2023 and chest CT dated 03/17/2023. FINDINGS: Evaluation of this exam is limited in the absence of intravenous contrast. CT CHEST FINDINGS Cardiovascular: There is no cardiomegaly or pericardial effusion. There is coronary vascular calcification. Right pectoral pacemaker device and left-sided Port-A-Cath with tip at the cavoatrial junction. There is mild atherosclerotic calcification of the thoracic aorta. No aneurysmal dilatation. The central pulmonary arteries are grossly unremarkable. Mediastinum/Nodes: No hilar or mediastinal adenopathy noted. The esophagus is grossly unremarkable. No mediastinal fluid collection. Lungs/Pleura: Postsurgical changes  of the left lobectomy with complete volume loss and shift of the mediastinum into the left hemithorax. Interval decrease in the size of the loculated collection along the left pleural surface. There is background of emphysema. Improvement of airspace density in the right lower lobe since the prior CT favored to represent resolving infection or developing scarring. Attention on follow-up imaging recommended. No associated metabolic activity was seen on the PET CT. No new density. No pleural effusion pneumothorax. The central airways are patent. Musculoskeletal: Postsurgical changes of the left ribs related to lobectomy. Similar appearance of fracture of posterior right sixth rib. No new fracture or bone lesion. CT ABDOMEN PELVIS FINDINGS No intra-abdominal free air or free fluid. Hepatobiliary: Small liver cysts. No biliary dilatation. Cholecystectomy. Pancreas: Unremarkable. No pancreatic ductal dilatation or surrounding inflammatory changes. Spleen: Normal in size without focal abnormality. Adrenals/Urinary Tract: The adrenal glands unremarkable. There is a solitary left kidney. There is no hydronephrosis or nephrolithiasis. Left renal cyst as seen on  the prior CT. There is postsurgical changes of cystectomy and right lower quadrant ileal conduit. Stomach/Bowel: Postsurgical changes of the bowel. There is a moderate size parastomal hernia in the right lower quadrant. There is no bowel obstruction or active inflammation. The appendix is normal. Vascular/Lymphatic: Moderate aortoiliac atherosclerotic disease. There is a 3 cm infrarenal abdominal aortic aneurysm. The IVC is unremarkable. No free gas. There is no adenopathy. Reproductive: Prostatectomy. Other: Midline vertical anterior abdominal wall incisional scar. Musculoskeletal: Osteopenia with degenerative changes of the spine. No acute osseous pathology. IMPRESSION: 1. Stable exam. No evidence of recurrent or metastatic disease in the chest, abdomen, or pelvis.  2. Stable postsurgical changes of the left lobectomy. 3. Improvement of airspace density in the right lower lobe since the prior CT favored to represent resolving infection or developing scarring. Attention on follow-up imaging recommended. 4. Postsurgical changes of cystectomy and right lower quadrant ileal conduit. Moderate size parastomal hernia in the right lower quadrant. No bowel obstruction. Normal appendix. 5. Aortic Atherosclerosis (ICD10-I70.0) and Emphysema (ICD10-J43.9). Electronically Signed   By: Vanetta Chou M.D.   On: 07/13/2023 17:33   CT ABDOMEN PELVIS WO CONTRAST Result Date: 07/13/2023 CLINICAL DATA:  Non-small cell lung cancer staging. EXAM: CT CHEST, ABDOMEN AND PELVIS WITHOUT CONTRAST TECHNIQUE: Multidetector CT imaging of the chest, abdomen and pelvis was performed following the standard protocol without IV contrast. RADIATION DOSE REDUCTION: This exam was performed according to the departmental dose-optimization program which includes automated exposure control, adjustment of the mA and/or kV according to patient size and/or use of iterative reconstruction technique. COMPARISON:  PET CT dated 04/14/2023 and chest CT dated 03/17/2023. FINDINGS: Evaluation of this exam is limited in the absence of intravenous contrast. CT CHEST FINDINGS Cardiovascular: There is no cardiomegaly or pericardial effusion. There is coronary vascular calcification. Right pectoral pacemaker device and left-sided Port-A-Cath with tip at the cavoatrial junction. There is mild atherosclerotic calcification of the thoracic aorta. No aneurysmal dilatation. The central pulmonary arteries are grossly unremarkable. Mediastinum/Nodes: No hilar or mediastinal adenopathy noted. The esophagus is grossly unremarkable. No mediastinal fluid collection. Lungs/Pleura: Postsurgical changes of the left lobectomy with complete volume loss and shift of the mediastinum into the left hemithorax. Interval decrease in the size of the  loculated collection along the left pleural surface. There is background of emphysema. Improvement of airspace density in the right lower lobe since the prior CT favored to represent resolving infection or developing scarring. Attention on follow-up imaging recommended. No associated metabolic activity was seen on the PET CT. No new density. No pleural effusion pneumothorax. The central airways are patent. Musculoskeletal: Postsurgical changes of the left ribs related to lobectomy. Similar appearance of fracture of posterior right sixth rib. No new fracture or bone lesion. CT ABDOMEN PELVIS FINDINGS No intra-abdominal free air or free fluid. Hepatobiliary: Small liver cysts. No biliary dilatation. Cholecystectomy. Pancreas: Unremarkable. No pancreatic ductal dilatation or surrounding inflammatory changes. Spleen: Normal in size without focal abnormality. Adrenals/Urinary Tract: The adrenal glands unremarkable. There is a solitary left kidney. There is no hydronephrosis or nephrolithiasis. Left renal cyst as seen on the prior CT. There is postsurgical changes of cystectomy and right lower quadrant ileal conduit. Stomach/Bowel: Postsurgical changes of the bowel. There is a moderate size parastomal hernia in the right lower quadrant. There is no bowel obstruction or active inflammation. The appendix is normal. Vascular/Lymphatic: Moderate aortoiliac atherosclerotic disease. There is a 3 cm infrarenal abdominal aortic aneurysm. The IVC is unremarkable. No free gas. There is no  adenopathy. Reproductive: Prostatectomy. Other: Midline vertical anterior abdominal wall incisional scar. Musculoskeletal: Osteopenia with degenerative changes of the spine. No acute osseous pathology. IMPRESSION: 1. Stable exam. No evidence of recurrent or metastatic disease in the chest, abdomen, or pelvis. 2. Stable postsurgical changes of the left lobectomy. 3. Improvement of airspace density in the right lower lobe since the prior CT favored  to represent resolving infection or developing scarring. Attention on follow-up imaging recommended. 4. Postsurgical changes of cystectomy and right lower quadrant ileal conduit. Moderate size parastomal hernia in the right lower quadrant. No bowel obstruction. Normal appendix. 5. Aortic Atherosclerosis (ICD10-I70.0) and Emphysema (ICD10-J43.9). Electronically Signed   By: Vanetta Chou M.D.   On: 07/13/2023 17:33     ASSESSMENT/PLAN:  This is a very pleasant 78 year old Caucasian male with:    1)  recurrent non-small cell lung cancer status post several treatment regimen including neoadjuvant chemotherapy followed by left upper lobectomy followed by systemic chemotherapy as well as maintenance treatment with Avastin and has been observation since June of 2010 and with no evidence for disease recurrence until November 2015 when he was found to have hypermetabolic activity in the left lower lobe.. The patient underwent redo thoracotomy with complete left pneumonectomy His imaging studies including CT scan of the chest as well as a PET scan in August 2021 showed a suspicious hypermetabolic nodule in the right lung.  The patient underwent CT-guided biopsy of this lesion but it was negative for malignancy.  Unfortunately further imaging studies scan showed increase in the size of the irregular solid 2.0 cm superior right lower lobe pulmonary nodule suspicious for metachronous primary bronchogenic carcinoma versus metastasis. This was highly suspicious for malignancy even with the negative previous biopsy. He underwent SBRT to this lesion under the care of Dr. Dewey.   The patient is currently on observation and he is doing fine with no concerning complaints.   He recently had a restaging CT scan. The patient was seen with Dr. Sherrod. Dr. Sherrod personally and independently reviewed the scan and discussed the results with the patient today. The scan showed no evidence of disease progression.    Dr.  Sherrod recommended for the patient to continue on observation with repeat CT scan of the chest, abdomen and pelvis in around 6 months for restaging of his lung cancer and bladder cancer.    We will arrange for flush appointments every 8 weeks.    2) history of bladder cancer: Status post resection at [facility].he is currently followed by Dr.Tsivinn at [facility].  The recent biopsy confirmed a high-grade urothelial carcinoma.  He underwent right nephroureterectomy at [facility] on July 12, 2020 followed by urology at that facility.  He is currently on observation.  3) Pulmonary embolism and atrial fibrillation, the patient developed left external iliac/femoral vein thrombus during his visit at [facility]. He is current on treatment with Eliquis and tolerating it fairly well.   The patient was advised to call immediately if he has any concerning symptoms in the interval. The patient voices understanding of current disease status and treatment options and is in agreement with the current care plan. All questions were answered. The patient knows to call the clinic with any problems, questions or concerns. We can certainly see the patient much sooner if necessary   Orders Placed This Encounter  Procedures   CT CHEST ABDOMEN PELVIS WO CONTRAST    Standing Status:   Future    Expected Date:  02/09/2024    Expiration Date:   08/08/2024    Preferred imaging location?:   [facility]    If indicated for the ordered procedure, I authorize the administration of oral contrast media per Radiology protocol:   No    Reason for no oral contrast::   CKD   CBC with Differential (Cancer Center Only)    Standing Status:   Future    Expected Date:   02/09/2024    Expiration Date:   08/08/2024   CMP (Cancer Center only)    Standing Status:   Future    Expected Date:   02/09/2024    Expiration Date:   08/08/2024     Wesley Harmon Monai Hindes,  PA-C 08/09/23  ADDENDUM: Hematology/Oncology Attending: I had a face-to-face encounter with the patient today.  I reviewed his records, lab, scan and recommended his care plan.  This is a very pleasant 78 years old white male with history of recurrent non-small cell lung cancer that was initially diagnosed as stage III in September 2006 with evidence of new right lower lobe synchronous primary.  He also has a history of bladder cancer.  He is status post several treatment in the past including 3 cycles of neoadjuvant systemic chemotherapy followed by left upper lobectomy and then 3 cycles of adjuvant systemic chemotherapy with carboplatin and gemcitabine.  He also had treatment with carboplatin, paclitaxel and Avastin in 2007 when he developed disease recurrence.  This was followed by maintenance Avastin for 42 cycles.  The patient is currently on observation but on treatment with Eliquis for history of pulmonary embolism.  He had repeat CT scan of the chest, abdomen and pelvis performed recently.  I personally independently reviewed the scan and discussed the result with the patient today.  His scan showed no concerning findings for disease progression. I recommended for him to continue on observation with repeat CT scan of the chest, abdomen and pelvis in 6 months. He was advised to call immediately if he has any other concerning symptoms in the interval. The total time spent in the appointment was 30 minutes including review of chart and various tests results, discussions about plan of care and coordination of care plan . Disclaimer: This note was dictated with voice recognition software. Similar sounding words can inadvertently be transcribed and may be missed upon review. Sherrod MARLA Sherrod, MD

## 2023-08-09 ENCOUNTER — Encounter: Payer: Self-pay | Admitting: Internal Medicine

## 2023-08-09 ENCOUNTER — Inpatient Hospital Stay: Attending: Internal Medicine | Admitting: Physician Assistant

## 2023-08-09 VITALS — BP 122/77 | HR 81 | Temp 97.5°F | Resp 18 | Ht 69.0 in | Wt 201.2 lb

## 2023-08-09 DIAGNOSIS — I2699 Other pulmonary embolism without acute cor pulmonale: Secondary | ICD-10-CM | POA: Insufficient documentation

## 2023-08-09 DIAGNOSIS — K7689 Other specified diseases of liver: Secondary | ICD-10-CM | POA: Diagnosis not present

## 2023-08-09 DIAGNOSIS — I7143 Infrarenal abdominal aortic aneurysm, without rupture: Secondary | ICD-10-CM | POA: Insufficient documentation

## 2023-08-09 DIAGNOSIS — J439 Emphysema, unspecified: Secondary | ICD-10-CM | POA: Diagnosis not present

## 2023-08-09 DIAGNOSIS — Z9049 Acquired absence of other specified parts of digestive tract: Secondary | ICD-10-CM | POA: Diagnosis not present

## 2023-08-09 DIAGNOSIS — C3432 Malignant neoplasm of lower lobe, left bronchus or lung: Secondary | ICD-10-CM

## 2023-08-09 DIAGNOSIS — N281 Cyst of kidney, acquired: Secondary | ICD-10-CM | POA: Insufficient documentation

## 2023-08-09 DIAGNOSIS — Z79899 Other long term (current) drug therapy: Secondary | ICD-10-CM | POA: Insufficient documentation

## 2023-08-09 DIAGNOSIS — M8580 Other specified disorders of bone density and structure, unspecified site: Secondary | ICD-10-CM | POA: Diagnosis not present

## 2023-08-09 DIAGNOSIS — Z885 Allergy status to narcotic agent status: Secondary | ICD-10-CM | POA: Insufficient documentation

## 2023-08-09 DIAGNOSIS — Z8551 Personal history of malignant neoplasm of bladder: Secondary | ICD-10-CM | POA: Diagnosis not present

## 2023-08-09 DIAGNOSIS — K5900 Constipation, unspecified: Secondary | ICD-10-CM | POA: Insufficient documentation

## 2023-08-09 DIAGNOSIS — R319 Hematuria, unspecified: Secondary | ICD-10-CM | POA: Diagnosis not present

## 2023-08-09 DIAGNOSIS — Z7901 Long term (current) use of anticoagulants: Secondary | ICD-10-CM | POA: Diagnosis not present

## 2023-08-09 DIAGNOSIS — K435 Parastomal hernia without obstruction or  gangrene: Secondary | ICD-10-CM | POA: Insufficient documentation

## 2023-08-09 DIAGNOSIS — I4891 Unspecified atrial fibrillation: Secondary | ICD-10-CM | POA: Diagnosis not present

## 2023-08-09 DIAGNOSIS — C3431 Malignant neoplasm of lower lobe, right bronchus or lung: Secondary | ICD-10-CM | POA: Diagnosis present

## 2023-08-09 DIAGNOSIS — I700 Atherosclerosis of aorta: Secondary | ICD-10-CM | POA: Diagnosis not present

## 2023-08-10 ENCOUNTER — Telehealth: Payer: Self-pay | Admitting: Internal Medicine

## 2023-08-10 NOTE — Telephone Encounter (Signed)
Scheduled appointments with the patient

## 2023-08-12 ENCOUNTER — Encounter: Payer: Self-pay | Admitting: Internal Medicine

## 2023-10-17 ENCOUNTER — Encounter: Payer: Self-pay | Admitting: Internal Medicine

## 2024-01-29 ENCOUNTER — Encounter: Payer: Self-pay | Admitting: Internal Medicine

## 2024-02-05 ENCOUNTER — Inpatient Hospital Stay: Attending: Internal Medicine

## 2024-02-08 ENCOUNTER — Ambulatory Visit: Admitting: Internal Medicine
